# Patient Record
Sex: Female | Born: 1940 | Race: White | Hispanic: No | Marital: Married | Smoking: Former smoker
Health system: Southern US, Community
[De-identification: ages and names within clinical notes are randomized; demographics above are authoritative.]

## PROBLEM LIST (undated history)

## (undated) DIAGNOSIS — F0390 Unspecified dementia without behavioral disturbance: Secondary | ICD-10-CM

## (undated) DIAGNOSIS — Z5189 Encounter for other specified aftercare: Secondary | ICD-10-CM

## (undated) DIAGNOSIS — E039 Hypothyroidism, unspecified: Secondary | ICD-10-CM

## (undated) DIAGNOSIS — M797 Fibromyalgia: Secondary | ICD-10-CM

## (undated) DIAGNOSIS — F419 Anxiety disorder, unspecified: Secondary | ICD-10-CM

## (undated) DIAGNOSIS — N393 Stress incontinence (female) (male): Secondary | ICD-10-CM

## (undated) DIAGNOSIS — M1990 Unspecified osteoarthritis, unspecified site: Secondary | ICD-10-CM

## (undated) DIAGNOSIS — I10 Essential (primary) hypertension: Secondary | ICD-10-CM

## (undated) DIAGNOSIS — K219 Gastro-esophageal reflux disease without esophagitis: Secondary | ICD-10-CM

## (undated) DIAGNOSIS — IMO0001 Reserved for inherently not codable concepts without codable children: Secondary | ICD-10-CM

## (undated) DIAGNOSIS — I639 Cerebral infarction, unspecified: Secondary | ICD-10-CM

## (undated) DIAGNOSIS — I209 Angina pectoris, unspecified: Secondary | ICD-10-CM

## (undated) DIAGNOSIS — R51 Headache: Secondary | ICD-10-CM

## (undated) HISTORY — PX: CERVICAL FUSION: SHX112

## (undated) HISTORY — PX: CHOLECYSTECTOMY: SHX55

## (undated) HISTORY — PX: WISDOM TOOTH EXTRACTION: SHX21

## (undated) HISTORY — PX: RECTOCELE REPAIR: SHX761

## (undated) HISTORY — PX: ABDOMINAL HYSTERECTOMY: SHX81

## (undated) HISTORY — PX: TUBAL LIGATION: SHX77

---

## 1997-07-26 ENCOUNTER — Other Ambulatory Visit: Admission: RE | Admit: 1997-07-26 | Discharge: 1997-07-26 | Payer: Self-pay | Admitting: Obstetrics and Gynecology

## 1997-09-19 ENCOUNTER — Inpatient Hospital Stay (HOSPITAL_COMMUNITY): Admission: RE | Admit: 1997-09-19 | Discharge: 1997-09-21 | Payer: Self-pay | Admitting: Gastroenterology

## 1997-12-18 ENCOUNTER — Ambulatory Visit (HOSPITAL_COMMUNITY): Admission: RE | Admit: 1997-12-18 | Discharge: 1997-12-18 | Payer: Self-pay | Admitting: Obstetrics and Gynecology

## 1997-12-31 ENCOUNTER — Ambulatory Visit (HOSPITAL_COMMUNITY): Admission: RE | Admit: 1997-12-31 | Discharge: 1997-12-31 | Payer: Self-pay | Admitting: Gastroenterology

## 1998-01-13 ENCOUNTER — Inpatient Hospital Stay (HOSPITAL_COMMUNITY): Admission: RE | Admit: 1998-01-13 | Discharge: 1998-01-15 | Payer: Self-pay | Admitting: Obstetrics and Gynecology

## 1998-02-27 ENCOUNTER — Ambulatory Visit (HOSPITAL_COMMUNITY): Admission: RE | Admit: 1998-02-27 | Discharge: 1998-02-27 | Payer: Self-pay | Admitting: Obstetrics and Gynecology

## 1998-11-11 ENCOUNTER — Other Ambulatory Visit: Admission: RE | Admit: 1998-11-11 | Discharge: 1998-11-11 | Payer: Self-pay | Admitting: Obstetrics and Gynecology

## 1999-11-12 ENCOUNTER — Encounter: Payer: Self-pay | Admitting: Obstetrics and Gynecology

## 1999-11-12 ENCOUNTER — Ambulatory Visit (HOSPITAL_COMMUNITY): Admission: RE | Admit: 1999-11-12 | Discharge: 1999-11-12 | Payer: Self-pay | Admitting: Obstetrics and Gynecology

## 1999-12-07 ENCOUNTER — Other Ambulatory Visit: Admission: RE | Admit: 1999-12-07 | Discharge: 1999-12-07 | Payer: Self-pay | Admitting: Obstetrics and Gynecology

## 2000-09-01 ENCOUNTER — Encounter: Payer: Self-pay | Admitting: Internal Medicine

## 2000-09-01 ENCOUNTER — Encounter: Admission: RE | Admit: 2000-09-01 | Discharge: 2000-09-01 | Payer: Self-pay | Admitting: Internal Medicine

## 2000-11-14 ENCOUNTER — Encounter: Payer: Self-pay | Admitting: Obstetrics and Gynecology

## 2000-11-14 ENCOUNTER — Ambulatory Visit (HOSPITAL_COMMUNITY): Admission: RE | Admit: 2000-11-14 | Discharge: 2000-11-14 | Payer: Self-pay | Admitting: Obstetrics and Gynecology

## 2000-12-15 ENCOUNTER — Other Ambulatory Visit: Admission: RE | Admit: 2000-12-15 | Discharge: 2000-12-15 | Payer: Self-pay | Admitting: Obstetrics and Gynecology

## 2001-01-25 ENCOUNTER — Ambulatory Visit (HOSPITAL_COMMUNITY): Admission: RE | Admit: 2001-01-25 | Discharge: 2001-01-25 | Payer: Self-pay | Admitting: Gastroenterology

## 2001-04-26 ENCOUNTER — Encounter: Payer: Self-pay | Admitting: *Deleted

## 2001-04-26 ENCOUNTER — Encounter: Admission: RE | Admit: 2001-04-26 | Discharge: 2001-04-26 | Payer: Self-pay | Admitting: *Deleted

## 2002-03-08 ENCOUNTER — Other Ambulatory Visit: Admission: RE | Admit: 2002-03-08 | Discharge: 2002-03-08 | Payer: Self-pay | Admitting: Obstetrics and Gynecology

## 2002-08-29 ENCOUNTER — Emergency Department (HOSPITAL_COMMUNITY): Admission: EM | Admit: 2002-08-29 | Discharge: 2002-08-29 | Payer: Self-pay | Admitting: Emergency Medicine

## 2002-09-10 ENCOUNTER — Encounter: Payer: Self-pay | Admitting: Internal Medicine

## 2002-09-10 ENCOUNTER — Encounter: Admission: RE | Admit: 2002-09-10 | Discharge: 2002-09-10 | Payer: Self-pay | Admitting: Internal Medicine

## 2002-09-11 ENCOUNTER — Encounter: Payer: Self-pay | Admitting: Internal Medicine

## 2002-09-11 ENCOUNTER — Encounter: Admission: RE | Admit: 2002-09-11 | Discharge: 2002-09-11 | Payer: Self-pay | Admitting: Internal Medicine

## 2002-10-04 ENCOUNTER — Encounter: Admission: RE | Admit: 2002-10-04 | Discharge: 2003-01-02 | Payer: Self-pay | Admitting: Internal Medicine

## 2003-06-18 ENCOUNTER — Ambulatory Visit (HOSPITAL_COMMUNITY): Admission: RE | Admit: 2003-06-18 | Discharge: 2003-06-18 | Payer: Self-pay | Admitting: Internal Medicine

## 2004-02-23 LAB — HM COLONOSCOPY

## 2004-03-10 ENCOUNTER — Ambulatory Visit (HOSPITAL_COMMUNITY): Admission: RE | Admit: 2004-03-10 | Discharge: 2004-03-10 | Payer: Self-pay | Admitting: Gastroenterology

## 2004-04-29 ENCOUNTER — Encounter: Admission: RE | Admit: 2004-04-29 | Discharge: 2004-04-29 | Payer: Self-pay | Admitting: Internal Medicine

## 2004-05-06 ENCOUNTER — Encounter: Admission: RE | Admit: 2004-05-06 | Discharge: 2004-05-06 | Payer: Self-pay

## 2004-05-21 ENCOUNTER — Encounter: Admission: RE | Admit: 2004-05-21 | Discharge: 2004-05-21 | Payer: Self-pay

## 2004-06-04 ENCOUNTER — Encounter: Admission: RE | Admit: 2004-06-04 | Discharge: 2004-06-04 | Payer: Self-pay | Admitting: Neurology

## 2005-01-08 ENCOUNTER — Encounter: Admission: RE | Admit: 2005-01-08 | Discharge: 2005-01-08 | Payer: Self-pay | Admitting: Internal Medicine

## 2005-02-01 ENCOUNTER — Ambulatory Visit: Payer: Self-pay | Admitting: Family Medicine

## 2005-02-03 ENCOUNTER — Ambulatory Visit: Payer: Self-pay | Admitting: Family Medicine

## 2006-06-05 ENCOUNTER — Emergency Department (HOSPITAL_COMMUNITY): Admission: EM | Admit: 2006-06-05 | Discharge: 2006-06-05 | Payer: Self-pay | Admitting: Emergency Medicine

## 2006-06-12 ENCOUNTER — Inpatient Hospital Stay (HOSPITAL_COMMUNITY): Admission: EM | Admit: 2006-06-12 | Discharge: 2006-06-15 | Payer: Self-pay | Admitting: Emergency Medicine

## 2007-11-09 ENCOUNTER — Ambulatory Visit (HOSPITAL_COMMUNITY): Admission: RE | Admit: 2007-11-09 | Discharge: 2007-11-09 | Payer: Self-pay | Admitting: Family Medicine

## 2007-12-19 ENCOUNTER — Encounter: Admission: RE | Admit: 2007-12-19 | Discharge: 2007-12-19 | Payer: Self-pay | Admitting: Surgery

## 2008-02-12 ENCOUNTER — Encounter (INDEPENDENT_AMBULATORY_CARE_PROVIDER_SITE_OTHER): Payer: Self-pay | Admitting: Surgery

## 2008-02-12 ENCOUNTER — Ambulatory Visit (HOSPITAL_COMMUNITY): Admission: RE | Admit: 2008-02-12 | Discharge: 2008-02-12 | Payer: Self-pay | Admitting: Surgery

## 2008-10-21 ENCOUNTER — Encounter (INDEPENDENT_AMBULATORY_CARE_PROVIDER_SITE_OTHER): Payer: Self-pay | Admitting: Internal Medicine

## 2008-10-21 ENCOUNTER — Ambulatory Visit: Payer: Self-pay | Admitting: Cardiology

## 2008-10-21 ENCOUNTER — Inpatient Hospital Stay (HOSPITAL_COMMUNITY): Admission: EM | Admit: 2008-10-21 | Discharge: 2008-10-28 | Payer: Self-pay | Admitting: Emergency Medicine

## 2008-10-22 ENCOUNTER — Ambulatory Visit: Payer: Self-pay | Admitting: Physical Medicine & Rehabilitation

## 2008-10-22 ENCOUNTER — Encounter (INDEPENDENT_AMBULATORY_CARE_PROVIDER_SITE_OTHER): Payer: Self-pay | Admitting: Internal Medicine

## 2008-10-23 ENCOUNTER — Encounter: Payer: Self-pay | Admitting: Cardiology

## 2008-10-23 ENCOUNTER — Encounter (INDEPENDENT_AMBULATORY_CARE_PROVIDER_SITE_OTHER): Payer: Self-pay | Admitting: Internal Medicine

## 2008-10-28 ENCOUNTER — Ambulatory Visit: Payer: Self-pay | Admitting: Physical Medicine & Rehabilitation

## 2008-10-28 ENCOUNTER — Inpatient Hospital Stay (HOSPITAL_COMMUNITY)
Admission: RE | Admit: 2008-10-28 | Discharge: 2008-11-13 | Payer: Self-pay | Admitting: Physical Medicine & Rehabilitation

## 2008-11-01 ENCOUNTER — Ambulatory Visit: Payer: Self-pay | Admitting: Psychology

## 2008-11-02 ENCOUNTER — Ambulatory Visit: Payer: Self-pay | Admitting: Physical Medicine & Rehabilitation

## 2008-11-18 ENCOUNTER — Inpatient Hospital Stay (HOSPITAL_COMMUNITY): Admission: EM | Admit: 2008-11-18 | Discharge: 2008-12-03 | Payer: Self-pay | Admitting: Emergency Medicine

## 2008-11-18 ENCOUNTER — Ambulatory Visit: Payer: Self-pay | Admitting: Gastroenterology

## 2008-11-22 ENCOUNTER — Ambulatory Visit: Payer: Self-pay | Admitting: Physical Medicine & Rehabilitation

## 2008-12-04 ENCOUNTER — Encounter
Admission: RE | Admit: 2008-12-04 | Discharge: 2008-12-04 | Payer: Self-pay | Admitting: Physical Medicine & Rehabilitation

## 2009-12-04 ENCOUNTER — Encounter
Admission: RE | Admit: 2009-12-04 | Discharge: 2010-02-19 | Payer: Self-pay | Source: Home / Self Care | Attending: Obstetrics and Gynecology | Admitting: Obstetrics and Gynecology

## 2010-02-04 ENCOUNTER — Emergency Department (HOSPITAL_COMMUNITY)
Admission: EM | Admit: 2010-02-04 | Discharge: 2010-02-04 | Payer: Self-pay | Source: Home / Self Care | Admitting: Emergency Medicine

## 2010-02-11 ENCOUNTER — Encounter: Admission: RE | Admit: 2010-02-11 | Payer: Self-pay | Source: Home / Self Care | Admitting: Obstetrics and Gynecology

## 2010-02-23 ENCOUNTER — Encounter
Admission: RE | Admit: 2010-02-23 | Discharge: 2010-03-16 | Payer: Self-pay | Source: Home / Self Care | Attending: Obstetrics and Gynecology | Admitting: Obstetrics and Gynecology

## 2010-03-04 ENCOUNTER — Encounter: Admit: 2010-03-04 | Payer: Self-pay | Admitting: Obstetrics and Gynecology

## 2010-03-15 ENCOUNTER — Encounter: Payer: Self-pay | Admitting: Family Medicine

## 2010-05-05 LAB — CK TOTAL AND CKMB (NOT AT ARMC)
CK, MB: 1 ng/mL (ref 0.3–4.0)
Total CK: 42 U/L (ref 7–177)

## 2010-05-05 LAB — CBC
HCT: 43.3 % (ref 36.0–46.0)
MCV: 91.4 fL (ref 78.0–100.0)
RBC: 4.74 MIL/uL (ref 3.87–5.11)
RDW: 12.8 % (ref 11.5–15.5)
WBC: 11.4 10*3/uL — ABNORMAL HIGH (ref 4.0–10.5)

## 2010-05-05 LAB — COMPREHENSIVE METABOLIC PANEL
ALT: 26 U/L (ref 0–35)
Alkaline Phosphatase: 78 U/L (ref 39–117)
CO2: 31 mEq/L (ref 19–32)
Chloride: 99 mEq/L (ref 96–112)
GFR calc Af Amer: >60 mL/min (ref >60)
GFR calc non Af Amer: >60 mL/min (ref >60)
Potassium: 3.3 mEq/L — ABNORMAL LOW (ref 3.5–5.1)
Sodium: 137 mEq/L (ref 135–145)
Total Bilirubin: 0.6 mg/dL (ref 0.3–1.2)

## 2010-05-05 LAB — PROTIME-INR
INR: 0.93 (ref 0.00–1.49)
Prothrombin Time: 12.7 seconds (ref 11.6–15.2)

## 2010-05-05 LAB — APTT: aPTT: 28 seconds (ref 24–37)

## 2010-05-05 LAB — DIFFERENTIAL
Eosinophils Absolute: 0.1 10*3/uL (ref 0.0–0.7)
Lymphocytes Relative: 30 % (ref 12–46)
Lymphs Abs: 3.4 10*3/uL (ref 0.7–4.0)
Neutrophils Relative %: 62 % (ref 43–77)

## 2010-05-11 DIAGNOSIS — E119 Type 2 diabetes mellitus without complications: Secondary | ICD-10-CM | POA: Insufficient documentation

## 2010-05-11 DIAGNOSIS — K5792 Diverticulitis of intestine, part unspecified, without perforation or abscess without bleeding: Secondary | ICD-10-CM | POA: Insufficient documentation

## 2010-05-11 DIAGNOSIS — F418 Other specified anxiety disorders: Secondary | ICD-10-CM | POA: Insufficient documentation

## 2010-05-11 DIAGNOSIS — M797 Fibromyalgia: Secondary | ICD-10-CM | POA: Insufficient documentation

## 2010-05-11 DIAGNOSIS — F329 Major depressive disorder, single episode, unspecified: Secondary | ICD-10-CM

## 2010-05-11 DIAGNOSIS — I10 Essential (primary) hypertension: Secondary | ICD-10-CM | POA: Insufficient documentation

## 2010-05-11 DIAGNOSIS — I639 Cerebral infarction, unspecified: Secondary | ICD-10-CM

## 2010-05-28 LAB — PHOSPHORUS: Phosphorus: 3.8 mg/dL (ref 2.3–4.6)

## 2010-05-28 LAB — GLUCOSE, CAPILLARY
Glucose-Capillary: 111 mg/dL — ABNORMAL HIGH (ref 70–99)
Glucose-Capillary: 115 mg/dL — ABNORMAL HIGH (ref 70–99)
Glucose-Capillary: 115 mg/dL — ABNORMAL HIGH (ref 70–99)
Glucose-Capillary: 117 mg/dL — ABNORMAL HIGH (ref 70–99)
Glucose-Capillary: 121 mg/dL — ABNORMAL HIGH (ref 70–99)
Glucose-Capillary: 127 mg/dL — ABNORMAL HIGH (ref 70–99)
Glucose-Capillary: 133 mg/dL — ABNORMAL HIGH (ref 70–99)
Glucose-Capillary: 135 mg/dL — ABNORMAL HIGH (ref 70–99)
Glucose-Capillary: 137 mg/dL — ABNORMAL HIGH (ref 70–99)
Glucose-Capillary: 139 mg/dL — ABNORMAL HIGH (ref 70–99)
Glucose-Capillary: 142 mg/dL — ABNORMAL HIGH (ref 70–99)
Glucose-Capillary: 150 mg/dL — ABNORMAL HIGH (ref 70–99)
Glucose-Capillary: 151 mg/dL — ABNORMAL HIGH (ref 70–99)
Glucose-Capillary: 152 mg/dL — ABNORMAL HIGH (ref 70–99)
Glucose-Capillary: 152 mg/dL — ABNORMAL HIGH (ref 70–99)
Glucose-Capillary: 170 mg/dL — ABNORMAL HIGH (ref 70–99)
Glucose-Capillary: 187 mg/dL — ABNORMAL HIGH (ref 70–99)
Glucose-Capillary: 190 mg/dL — ABNORMAL HIGH (ref 70–99)
Glucose-Capillary: 196 mg/dL — ABNORMAL HIGH (ref 70–99)
Glucose-Capillary: 207 mg/dL — ABNORMAL HIGH (ref 70–99)
Glucose-Capillary: 210 mg/dL — ABNORMAL HIGH (ref 70–99)
Glucose-Capillary: 214 mg/dL — ABNORMAL HIGH (ref 70–99)
Glucose-Capillary: 239 mg/dL — ABNORMAL HIGH (ref 70–99)
Glucose-Capillary: 271 mg/dL — ABNORMAL HIGH (ref 70–99)
Glucose-Capillary: 51 mg/dL — ABNORMAL LOW (ref 70–99)
Glucose-Capillary: 56 mg/dL — ABNORMAL LOW (ref 70–99)
Glucose-Capillary: 58 mg/dL — ABNORMAL LOW (ref 70–99)

## 2010-05-28 LAB — ELECTROLYTE PANEL
CO2: 20 mEq/L (ref 19–32)
CO2: 22 mEq/L (ref 19–32)
CO2: 23 mEq/L (ref 19–32)
CO2: 23 mEq/L (ref 19–32)
CO2: 23 mEq/L (ref 19–32)
CO2: 24 mEq/L (ref 19–32)
CO2: 24 mEq/L (ref 19–32)
CO2: 24 mEq/L (ref 19–32)
CO2: 24 mEq/L (ref 19–32)
CO2: 24 mEq/L (ref 19–32)
CO2: 25 mEq/L (ref 19–32)
CO2: 25 mEq/L (ref 19–32)
CO2: 26 mEq/L (ref 19–32)
Chloride: 101 mEq/L (ref 96–112)
Chloride: 101 mEq/L (ref 96–112)
Chloride: 102 mEq/L (ref 96–112)
Chloride: 102 mEq/L (ref 96–112)
Chloride: 103 mEq/L (ref 96–112)
Chloride: 103 mEq/L (ref 96–112)
Chloride: 104 mEq/L (ref 96–112)
Chloride: 105 mEq/L (ref 96–112)
Chloride: 105 mEq/L (ref 96–112)
Chloride: 105 mEq/L (ref 96–112)
Chloride: 107 mEq/L (ref 96–112)
Chloride: 91 mEq/L — ABNORMAL LOW (ref 96–112)
Chloride: 91 mEq/L — ABNORMAL LOW (ref 96–112)
Chloride: 91 mEq/L — ABNORMAL LOW (ref 96–112)
Chloride: 94 mEq/L — ABNORMAL LOW (ref 96–112)
Chloride: 95 mEq/L — ABNORMAL LOW (ref 96–112)
Chloride: 96 mEq/L (ref 96–112)
Potassium: 3.2 mEq/L — ABNORMAL LOW (ref 3.5–5.1)
Potassium: 3.4 mEq/L — ABNORMAL LOW (ref 3.5–5.1)
Potassium: 3.5 mEq/L (ref 3.5–5.1)
Potassium: 3.7 mEq/L (ref 3.5–5.1)
Potassium: 3.8 mEq/L (ref 3.5–5.1)
Potassium: 4 mEq/L (ref 3.5–5.1)
Potassium: 4.2 mEq/L (ref 3.5–5.1)
Potassium: 4.2 mEq/L (ref 3.5–5.1)
Potassium: 4.3 mEq/L (ref 3.5–5.1)
Potassium: 4.7 mEq/L (ref 3.5–5.1)
Potassium: 5 mEq/L (ref 3.5–5.1)
Sodium: 123 mEq/L — ABNORMAL LOW (ref 135–145)
Sodium: 123 mEq/L — ABNORMAL LOW (ref 135–145)
Sodium: 127 mEq/L — ABNORMAL LOW (ref 135–145)
Sodium: 127 mEq/L — ABNORMAL LOW (ref 135–145)
Sodium: 129 mEq/L — ABNORMAL LOW (ref 135–145)
Sodium: 130 mEq/L — ABNORMAL LOW (ref 135–145)
Sodium: 131 mEq/L — ABNORMAL LOW (ref 135–145)
Sodium: 132 mEq/L — ABNORMAL LOW (ref 135–145)
Sodium: 132 mEq/L — ABNORMAL LOW (ref 135–145)
Sodium: 134 mEq/L — ABNORMAL LOW (ref 135–145)
Sodium: 134 mEq/L — ABNORMAL LOW (ref 135–145)
Sodium: 135 mEq/L (ref 135–145)
Sodium: 135 mEq/L (ref 135–145)
Sodium: 135 mEq/L (ref 135–145)

## 2010-05-28 LAB — BASIC METABOLIC PANEL
BUN: 11 mg/dL (ref 6–23)
BUN: 11 mg/dL (ref 6–23)
BUN: 4 mg/dL — ABNORMAL LOW (ref 6–23)
BUN: 7 mg/dL (ref 6–23)
BUN: 7 mg/dL (ref 6–23)
BUN: 8 mg/dL (ref 6–23)
BUN: 8 mg/dL (ref 6–23)
BUN: 9 mg/dL (ref 6–23)
CO2: 19 mEq/L (ref 19–32)
CO2: 22 mEq/L (ref 19–32)
CO2: 23 mEq/L (ref 19–32)
CO2: 23 mEq/L (ref 19–32)
CO2: 25 mEq/L (ref 19–32)
CO2: 25 mEq/L (ref 19–32)
CO2: 27 mEq/L (ref 19–32)
Calcium: 10.1 mg/dL (ref 8.4–10.5)
Calcium: 9 mg/dL (ref 8.4–10.5)
Calcium: 9.2 mg/dL (ref 8.4–10.5)
Calcium: 9.2 mg/dL (ref 8.4–10.5)
Calcium: 9.3 mg/dL (ref 8.4–10.5)
Calcium: 9.4 mg/dL (ref 8.4–10.5)
Chloride: 102 mEq/L (ref 96–112)
Chloride: 104 mEq/L (ref 96–112)
Chloride: 105 mEq/L (ref 96–112)
Chloride: 106 mEq/L (ref 96–112)
Chloride: 108 mEq/L (ref 96–112)
Chloride: 93 mEq/L — ABNORMAL LOW (ref 96–112)
Chloride: 93 mEq/L — ABNORMAL LOW (ref 96–112)
Creatinine, Ser: 0.73 mg/dL (ref 0.4–1.2)
Creatinine, Ser: 0.77 mg/dL (ref 0.4–1.2)
Creatinine, Ser: 0.79 mg/dL (ref 0.4–1.2)
Creatinine, Ser: 0.79 mg/dL (ref 0.4–1.2)
Creatinine, Ser: 0.83 mg/dL (ref 0.4–1.2)
Creatinine, Ser: 0.87 mg/dL (ref 0.4–1.2)
Creatinine, Ser: 1.01 mg/dL (ref 0.4–1.2)
GFR calc Af Amer: >60 mL/min (ref >60)
GFR calc Af Amer: >60 mL/min (ref >60)
GFR calc Af Amer: >60 mL/min (ref >60)
GFR calc Af Amer: >60 mL/min (ref >60)
GFR calc Af Amer: >60 mL/min (ref >60)
GFR calc non Af Amer: 57 mL/min — ABNORMAL LOW (ref >60)
GFR calc non Af Amer: >60 mL/min (ref >60)
GFR calc non Af Amer: >60 mL/min (ref >60)
GFR calc non Af Amer: >60 mL/min (ref >60)
GFR calc non Af Amer: >60 mL/min (ref >60)
Glucose, Bld: 102 mg/dL — ABNORMAL HIGH (ref 70–99)
Glucose, Bld: 124 mg/dL — ABNORMAL HIGH (ref 70–99)
Glucose, Bld: 142 mg/dL — ABNORMAL HIGH (ref 70–99)
Glucose, Bld: 144 mg/dL — ABNORMAL HIGH (ref 70–99)
Potassium: 3.3 mEq/L — ABNORMAL LOW (ref 3.5–5.1)
Potassium: 3.5 mEq/L (ref 3.5–5.1)
Potassium: 3.6 mEq/L (ref 3.5–5.1)
Potassium: 3.7 mEq/L (ref 3.5–5.1)
Potassium: 3.8 mEq/L (ref 3.5–5.1)
Potassium: 3.9 mEq/L (ref 3.5–5.1)
Potassium: 4.4 mEq/L (ref 3.5–5.1)
Sodium: 125 mEq/L — ABNORMAL LOW (ref 135–145)
Sodium: 127 mEq/L — ABNORMAL LOW (ref 135–145)
Sodium: 129 mEq/L — ABNORMAL LOW (ref 135–145)
Sodium: 137 mEq/L (ref 135–145)
Sodium: 138 mEq/L (ref 135–145)

## 2010-05-28 LAB — CBC
HCT: 39.3 % (ref 36.0–46.0)
HCT: 41.8 % (ref 36.0–46.0)
HCT: 44.3 % (ref 36.0–46.0)
Hemoglobin: 13.5 g/dL (ref 12.0–15.0)
Hemoglobin: 14.1 g/dL (ref 12.0–15.0)
Hemoglobin: 15.1 g/dL — ABNORMAL HIGH (ref 12.0–15.0)
MCHC: 33.7 g/dL (ref 30.0–36.0)
MCHC: 34.1 g/dL (ref 30.0–36.0)
MCHC: 34.3 g/dL (ref 30.0–36.0)
MCV: 91.8 fL (ref 78.0–100.0)
MCV: 92.3 fL (ref 78.0–100.0)
MCV: 93 fL (ref 78.0–100.0)
Platelets: 251 10*3/uL (ref 150–400)
Platelets: 276 10*3/uL (ref 150–400)
RBC: 4.26 MIL/uL (ref 3.87–5.11)
RBC: 4.54 MIL/uL (ref 3.87–5.11)
RBC: 4.83 MIL/uL (ref 3.87–5.11)
RDW: 13.2 % (ref 11.5–15.5)
RDW: 13.3 % (ref 11.5–15.5)
WBC: 11.4 10*3/uL — ABNORMAL HIGH (ref 4.0–10.5)
WBC: 12.7 10*3/uL — ABNORMAL HIGH (ref 4.0–10.5)
WBC: 12.8 10*3/uL — ABNORMAL HIGH (ref 4.0–10.5)

## 2010-05-28 LAB — URINALYSIS, ROUTINE W REFLEX MICROSCOPIC
Bilirubin Urine: NEGATIVE
Ketones, ur: 15 mg/dL — AB
Nitrite: NEGATIVE
Protein, ur: 30 mg/dL — AB
Urobilinogen, UA: 0.2 mg/dL (ref 0.0–1.0)

## 2010-05-28 LAB — MAGNESIUM
Magnesium: 1.8 mg/dL (ref 1.5–2.5)
Magnesium: 1.8 mg/dL (ref 1.5–2.5)

## 2010-05-28 LAB — CARDIAC PANEL(CRET KIN+CKTOT+MB+TROPI)
CK, MB: 2.1 ng/mL (ref 0.3–4.0)
Total CK: 120 U/L (ref 7–177)
Troponin I: 0.02 ng/mL (ref 0.00–0.06)

## 2010-05-29 LAB — URINE CULTURE
Colony Count: 65000
Colony Count: >=100000
Colony Count: NO GROWTH
Colony Count: 45000

## 2010-05-29 LAB — URINALYSIS, ROUTINE W REFLEX MICROSCOPIC
Bilirubin Urine: NEGATIVE
Bilirubin Urine: NEGATIVE
Glucose, UA: NEGATIVE mg/dL
Glucose, UA: NEGATIVE mg/dL
Glucose, UA: NEGATIVE mg/dL
Hgb urine dipstick: NEGATIVE
Hgb urine dipstick: NEGATIVE
Hgb urine dipstick: NEGATIVE
Hgb urine dipstick: NEGATIVE
Ketones, ur: 15 mg/dL — AB
Ketones, ur: NEGATIVE mg/dL
Ketones, ur: NEGATIVE mg/dL
Nitrite: NEGATIVE
Nitrite: NEGATIVE
Protein, ur: NEGATIVE mg/dL
Specific Gravity, Urine: 1.012 (ref 1.005–1.030)
Specific Gravity, Urine: 1.013 (ref 1.005–1.030)
Specific Gravity, Urine: 1.009 (ref 1.005–1.030)
Urobilinogen, UA: 0.2 mg/dL (ref 0.0–1.0)
pH: 7 (ref 5.0–8.0)
pH: 7.5 (ref 5.0–8.0)
pH: 7 (ref 5.0–8.0)

## 2010-05-29 LAB — GLUCOSE, CAPILLARY
Glucose-Capillary: 101 mg/dL — ABNORMAL HIGH (ref 70–99)
Glucose-Capillary: 104 mg/dL — ABNORMAL HIGH (ref 70–99)
Glucose-Capillary: 109 mg/dL — ABNORMAL HIGH (ref 70–99)
Glucose-Capillary: 110 mg/dL — ABNORMAL HIGH (ref 70–99)
Glucose-Capillary: 121 mg/dL — ABNORMAL HIGH (ref 70–99)
Glucose-Capillary: 122 mg/dL — ABNORMAL HIGH (ref 70–99)
Glucose-Capillary: 135 mg/dL — ABNORMAL HIGH (ref 70–99)
Glucose-Capillary: 136 mg/dL — ABNORMAL HIGH (ref 70–99)
Glucose-Capillary: 139 mg/dL — ABNORMAL HIGH (ref 70–99)
Glucose-Capillary: 141 mg/dL — ABNORMAL HIGH (ref 70–99)
Glucose-Capillary: 142 mg/dL — ABNORMAL HIGH (ref 70–99)
Glucose-Capillary: 154 mg/dL — ABNORMAL HIGH (ref 70–99)
Glucose-Capillary: 158 mg/dL — ABNORMAL HIGH (ref 70–99)
Glucose-Capillary: 164 mg/dL — ABNORMAL HIGH (ref 70–99)
Glucose-Capillary: 180 mg/dL — ABNORMAL HIGH (ref 70–99)
Glucose-Capillary: 197 mg/dL — ABNORMAL HIGH (ref 70–99)
Glucose-Capillary: 200 mg/dL — ABNORMAL HIGH (ref 70–99)
Glucose-Capillary: 71 mg/dL (ref 70–99)
Glucose-Capillary: 77 mg/dL (ref 70–99)
Glucose-Capillary: 83 mg/dL (ref 70–99)
Glucose-Capillary: 87 mg/dL (ref 70–99)
Glucose-Capillary: 98 mg/dL (ref 70–99)
Glucose-Capillary: 102 mg/dL — ABNORMAL HIGH (ref 70–99)
Glucose-Capillary: 102 mg/dL — ABNORMAL HIGH (ref 70–99)
Glucose-Capillary: 105 mg/dL — ABNORMAL HIGH (ref 70–99)
Glucose-Capillary: 110 mg/dL — ABNORMAL HIGH (ref 70–99)
Glucose-Capillary: 111 mg/dL — ABNORMAL HIGH (ref 70–99)
Glucose-Capillary: 112 mg/dL — ABNORMAL HIGH (ref 70–99)
Glucose-Capillary: 125 mg/dL — ABNORMAL HIGH (ref 70–99)
Glucose-Capillary: 126 mg/dL — ABNORMAL HIGH (ref 70–99)
Glucose-Capillary: 127 mg/dL — ABNORMAL HIGH (ref 70–99)
Glucose-Capillary: 133 mg/dL — ABNORMAL HIGH (ref 70–99)
Glucose-Capillary: 146 mg/dL — ABNORMAL HIGH (ref 70–99)
Glucose-Capillary: 148 mg/dL — ABNORMAL HIGH (ref 70–99)
Glucose-Capillary: 153 mg/dL — ABNORMAL HIGH (ref 70–99)
Glucose-Capillary: 153 mg/dL — ABNORMAL HIGH (ref 70–99)
Glucose-Capillary: 154 mg/dL — ABNORMAL HIGH (ref 70–99)
Glucose-Capillary: 159 mg/dL — ABNORMAL HIGH (ref 70–99)
Glucose-Capillary: 164 mg/dL — ABNORMAL HIGH (ref 70–99)
Glucose-Capillary: 167 mg/dL — ABNORMAL HIGH (ref 70–99)
Glucose-Capillary: 174 mg/dL — ABNORMAL HIGH (ref 70–99)
Glucose-Capillary: 182 mg/dL — ABNORMAL HIGH (ref 70–99)
Glucose-Capillary: 189 mg/dL — ABNORMAL HIGH (ref 70–99)
Glucose-Capillary: 189 mg/dL — ABNORMAL HIGH (ref 70–99)
Glucose-Capillary: 189 mg/dL — ABNORMAL HIGH (ref 70–99)
Glucose-Capillary: 244 mg/dL — ABNORMAL HIGH (ref 70–99)
Glucose-Capillary: 257 mg/dL — ABNORMAL HIGH (ref 70–99)
Glucose-Capillary: 258 mg/dL — ABNORMAL HIGH (ref 70–99)
Glucose-Capillary: 267 mg/dL — ABNORMAL HIGH (ref 70–99)
Glucose-Capillary: 71 mg/dL (ref 70–99)
Glucose-Capillary: 97 mg/dL (ref 70–99)

## 2010-05-29 LAB — CBC
Hemoglobin: 13.1 g/dL (ref 12.0–15.0)
Hemoglobin: 14.2 g/dL (ref 12.0–15.0)
MCHC: 33.9 g/dL (ref 30.0–36.0)
MCHC: 34.3 g/dL (ref 30.0–36.0)
MCHC: 33.5 g/dL (ref 30.0–36.0)
MCHC: 34.2 g/dL (ref 30.0–36.0)
MCV: 90.9 fL (ref 78.0–100.0)
MCV: 91.1 fL (ref 78.0–100.0)
MCV: 91.8 fL (ref 78.0–100.0)
MCV: 92 fL (ref 78.0–100.0)
MCV: 93 fL (ref 78.0–100.0)
Platelets: 265 10*3/uL (ref 150–400)
Platelets: 220 10*3/uL (ref 150–400)
RBC: 4.2 MIL/uL (ref 3.87–5.11)
RBC: 4.56 MIL/uL (ref 3.87–5.11)
RBC: 4.71 MIL/uL (ref 3.87–5.11)
RBC: 4.87 MIL/uL (ref 3.87–5.11)
RDW: 12.6 % (ref 11.5–15.5)
RDW: 12.8 % (ref 11.5–15.5)
RDW: 12.9 % (ref 11.5–15.5)
WBC: 12.3 10*3/uL — ABNORMAL HIGH (ref 4.0–10.5)
WBC: 18.4 10*3/uL — ABNORMAL HIGH (ref 4.0–10.5)

## 2010-05-29 LAB — BASIC METABOLIC PANEL
BUN: 5 mg/dL — ABNORMAL LOW (ref 6–23)
BUN: 7 mg/dL (ref 6–23)
BUN: 7 mg/dL (ref 6–23)
BUN: 9 mg/dL (ref 6–23)
CO2: 25 mEq/L (ref 19–32)
CO2: 25 mEq/L (ref 19–32)
CO2: 26 mEq/L (ref 19–32)
Calcium: 8.5 mg/dL (ref 8.4–10.5)
Calcium: 9.1 mg/dL (ref 8.4–10.5)
Calcium: 9.2 mg/dL (ref 8.4–10.5)
Chloride: 91 mEq/L — ABNORMAL LOW (ref 96–112)
Chloride: 95 mEq/L — ABNORMAL LOW (ref 96–112)
Chloride: 99 mEq/L (ref 96–112)
Creatinine, Ser: 0.76 mg/dL (ref 0.4–1.2)
Creatinine, Ser: 0.86 mg/dL (ref 0.4–1.2)
Creatinine, Ser: 0.9 mg/dL (ref 0.4–1.2)
Creatinine, Ser: 0.94 mg/dL (ref 0.4–1.2)
GFR calc Af Amer: >60 mL/min (ref >60)
GFR calc Af Amer: >60 mL/min (ref >60)
GFR calc non Af Amer: >60 mL/min (ref >60)
GFR calc non Af Amer: 59 mL/min — ABNORMAL LOW (ref >60)
GFR calc non Af Amer: >60 mL/min (ref >60)
Glucose, Bld: 104 mg/dL — ABNORMAL HIGH (ref 70–99)
Glucose, Bld: 68 mg/dL — ABNORMAL LOW (ref 70–99)
Glucose, Bld: 76 mg/dL (ref 70–99)
Potassium: 3.2 mEq/L — ABNORMAL LOW (ref 3.5–5.1)

## 2010-05-29 LAB — COMPREHENSIVE METABOLIC PANEL
ALT: 40 U/L — ABNORMAL HIGH (ref 0–35)
ALT: 48 U/L — ABNORMAL HIGH (ref 0–35)
AST: 44 U/L — ABNORMAL HIGH (ref 0–37)
AST: 29 U/L (ref 0–37)
AST: 32 U/L (ref 0–37)
Albumin: 4.1 g/dL (ref 3.5–5.2)
Albumin: 3.6 g/dL (ref 3.5–5.2)
CO2: 25 mEq/L (ref 19–32)
CO2: 30 mEq/L (ref 19–32)
CO2: 32 mEq/L (ref 19–32)
Calcium: 9.6 mg/dL (ref 8.4–10.5)
Calcium: 9.2 mg/dL (ref 8.4–10.5)
Calcium: 9.6 mg/dL (ref 8.4–10.5)
Chloride: 76 mEq/L — CL (ref 96–112)
Chloride: 94 mEq/L — ABNORMAL LOW (ref 96–112)
Chloride: 95 mEq/L — ABNORMAL LOW (ref 96–112)
Creatinine, Ser: 0.71 mg/dL (ref 0.4–1.2)
Creatinine, Ser: 0.91 mg/dL (ref 0.4–1.2)
Creatinine, Ser: 0.89 mg/dL (ref 0.4–1.2)
Creatinine, Ser: 0.96 mg/dL (ref 0.4–1.2)
GFR calc Af Amer: >60 mL/min (ref >60)
GFR calc Af Amer: >60 mL/min (ref >60)
GFR calc Af Amer: >60 mL/min (ref >60)
GFR calc non Af Amer: >60 mL/min (ref >60)
GFR calc non Af Amer: 58 mL/min — ABNORMAL LOW (ref >60)
GFR calc non Af Amer: >60 mL/min (ref >60)
Glucose, Bld: 191 mg/dL — ABNORMAL HIGH (ref 70–99)
Glucose, Bld: 114 mg/dL — ABNORMAL HIGH (ref 70–99)
Sodium: 131 mEq/L — ABNORMAL LOW (ref 135–145)
Sodium: 136 mEq/L (ref 135–145)
Sodium: 136 mEq/L (ref 135–145)
Total Bilirubin: 1.1 mg/dL (ref 0.3–1.2)
Total Bilirubin: 1.9 mg/dL — ABNORMAL HIGH (ref 0.3–1.2)
Total Protein: 7 g/dL (ref 6.0–8.3)
Total Protein: 6.5 g/dL (ref 6.0–8.3)

## 2010-05-29 LAB — CARDIAC PANEL(CRET KIN+CKTOT+MB+TROPI)
Relative Index: 1.7 (ref 0.0–2.5)
Total CK: 525 U/L — ABNORMAL HIGH (ref 7–177)
Troponin I: 0.02 ng/mL (ref 0.00–0.06)

## 2010-05-29 LAB — DIFFERENTIAL
Basophils Absolute: 0 10*3/uL (ref 0.0–0.1)
Basophils Absolute: 0 10*3/uL (ref 0.0–0.1)
Basophils Relative: 0 % (ref 0–1)
Eosinophils Absolute: 0 10*3/uL (ref 0.0–0.7)
Eosinophils Absolute: 0 10*3/uL (ref 0.0–0.7)
Eosinophils Relative: 0 % (ref 0–5)
Eosinophils Relative: 0 % (ref 0–5)
Eosinophils Relative: 0 % (ref 0–5)
Lymphocytes Relative: 9 % — ABNORMAL LOW (ref 12–46)
Lymphocytes Relative: 22 % (ref 12–46)
Lymphs Abs: 1.4 10*3/uL (ref 0.7–4.0)
Lymphs Abs: 1.7 10*3/uL (ref 0.7–4.0)
Lymphs Abs: 2.6 10*3/uL (ref 0.7–4.0)
Monocytes Absolute: 1.2 10*3/uL — ABNORMAL HIGH (ref 0.1–1.0)
Monocytes Absolute: 1.4 10*3/uL — ABNORMAL HIGH (ref 0.1–1.0)
Monocytes Relative: 10 % (ref 3–12)
Monocytes Relative: 8 % (ref 3–12)
Monocytes Relative: 9 % (ref 3–12)
Neutro Abs: 8.6 10*3/uL — ABNORMAL HIGH (ref 1.7–7.7)
Neutrophils Relative %: 80 % — ABNORMAL HIGH (ref 43–77)
Neutrophils Relative %: 68 % (ref 43–77)

## 2010-05-29 LAB — ELECTROLYTE PANEL
CO2: 21 mEq/L (ref 19–32)
CO2: 22 mEq/L (ref 19–32)
CO2: 24 mEq/L (ref 19–32)
CO2: 25 mEq/L (ref 19–32)
CO2: 25 mEq/L (ref 19–32)
CO2: 26 mEq/L (ref 19–32)
CO2: 28 mEq/L (ref 19–32)
Chloride: 88 mEq/L — ABNORMAL LOW (ref 96–112)
Chloride: 90 mEq/L — ABNORMAL LOW (ref 96–112)
Chloride: 91 mEq/L — ABNORMAL LOW (ref 96–112)
Chloride: 92 mEq/L — ABNORMAL LOW (ref 96–112)
Chloride: 93 mEq/L — ABNORMAL LOW (ref 96–112)
Potassium: 2.9 mEq/L — ABNORMAL LOW (ref 3.5–5.1)
Potassium: 3.1 mEq/L — ABNORMAL LOW (ref 3.5–5.1)
Potassium: 3.2 mEq/L — ABNORMAL LOW (ref 3.5–5.1)
Potassium: 3.2 mEq/L — ABNORMAL LOW (ref 3.5–5.1)
Potassium: 3.4 mEq/L — ABNORMAL LOW (ref 3.5–5.1)
Potassium: 4 mEq/L (ref 3.5–5.1)
Sodium: 119 mEq/L — CL (ref 135–145)
Sodium: 124 mEq/L — ABNORMAL LOW (ref 135–145)
Sodium: 125 mEq/L — ABNORMAL LOW (ref 135–145)
Sodium: 126 mEq/L — ABNORMAL LOW (ref 135–145)
Sodium: 128 mEq/L — ABNORMAL LOW (ref 135–145)
Sodium: 129 mEq/L — ABNORMAL LOW (ref 135–145)

## 2010-05-29 LAB — OSMOLALITY, URINE: Osmolality, Ur: 87 mOsm/kg — ABNORMAL LOW (ref 390–1090)

## 2010-05-29 LAB — RENAL FUNCTION PANEL
Albumin: 3.2 g/dL — ABNORMAL LOW (ref 3.5–5.2)
BUN: 7 mg/dL (ref 6–23)
Calcium: 8.7 mg/dL (ref 8.4–10.5)
Creatinine, Ser: 0.8 mg/dL (ref 0.4–1.2)
Glucose, Bld: 84 mg/dL (ref 70–99)
Phosphorus: 3.3 mg/dL (ref 2.3–4.6)
Potassium: 3.3 mEq/L — ABNORMAL LOW (ref 3.5–5.1)

## 2010-05-29 LAB — HEMOGLOBIN A1C: Hgb A1c MFr Bld: 7.9 % — ABNORMAL HIGH (ref 4.6–6.1)

## 2010-05-29 LAB — POCT CARDIAC MARKERS
CKMB, poc: 6.2 ng/mL (ref 1.0–8.0)
CKMB, poc: 6.7 ng/mL (ref 1.0–8.0)
Myoglobin, poc: 351 ng/mL (ref 12–200)
Troponin i, poc: <0.05 ng/mL (ref 0.00–0.09)
Troponin i, poc: <0.05 ng/mL (ref 0.00–0.09)

## 2010-05-29 LAB — CK TOTAL AND CKMB (NOT AT ARMC)
CK, MB: 8.2 ng/mL — ABNORMAL HIGH (ref 0.3–4.0)
Total CK: 755 U/L — ABNORMAL HIGH (ref 7–177)

## 2010-05-29 LAB — LIPID PANEL
Cholesterol: 78 mg/dL (ref 0–200)
HDL: 37 mg/dL — ABNORMAL LOW (ref >39)
LDL Cholesterol: 22 mg/dL (ref 0–99)
Total CHOL/HDL Ratio: 2.1 RATIO
VLDL: 19 mg/dL (ref 0–40)

## 2010-05-29 LAB — URINE MICROSCOPIC-ADD ON

## 2010-05-29 LAB — CHLORIDE, URINE, RANDOM: Chloride Urine: 13 mEq/L

## 2010-05-29 LAB — MAGNESIUM: Magnesium: 2.1 mg/dL (ref 1.5–2.5)

## 2010-05-30 LAB — CK TOTAL AND CKMB (NOT AT ARMC)
CK, MB: 2.6 ng/mL (ref 0.3–4.0)
Relative Index: 1.3 (ref 0.0–2.5)
Relative Index: INVALID (ref 0.0–2.5)
Total CK: 126 U/L (ref 7–177)

## 2010-05-30 LAB — URINALYSIS, ROUTINE W REFLEX MICROSCOPIC
Glucose, UA: >1000 mg/dL — AB
Ketones, ur: 40 mg/dL — AB
Leukocytes, UA: NEGATIVE
Nitrite: NEGATIVE
Specific Gravity, Urine: 1.016 (ref 1.005–1.030)
pH: 7 (ref 5.0–8.0)

## 2010-05-30 LAB — DIFFERENTIAL
Basophils Absolute: 0 10*3/uL (ref 0.0–0.1)
Basophils Relative: 0 % (ref 0–1)
Eosinophils Relative: 0 % (ref 0–5)
Lymphocytes Relative: 8 % — ABNORMAL LOW (ref 12–46)
Neutro Abs: 14 10*3/uL — ABNORMAL HIGH (ref 1.7–7.7)

## 2010-05-30 LAB — TSH: TSH: 1.836 u[IU]/mL (ref 0.350–4.500)

## 2010-05-30 LAB — GLUCOSE, CAPILLARY
Glucose-Capillary: 145 mg/dL — ABNORMAL HIGH (ref 70–99)
Glucose-Capillary: 204 mg/dL — ABNORMAL HIGH (ref 70–99)
Glucose-Capillary: 234 mg/dL — ABNORMAL HIGH (ref 70–99)
Glucose-Capillary: 262 mg/dL — ABNORMAL HIGH (ref 70–99)

## 2010-05-30 LAB — COMPREHENSIVE METABOLIC PANEL
AST: 25 U/L (ref 0–37)
Albumin: 3.8 g/dL (ref 3.5–5.2)
Alkaline Phosphatase: 101 U/L (ref 39–117)
BUN: 4 mg/dL — ABNORMAL LOW (ref 6–23)
BUN: 7 mg/dL (ref 6–23)
CO2: 26 mEq/L (ref 19–32)
Chloride: 101 mEq/L (ref 96–112)
Creatinine, Ser: 0.72 mg/dL (ref 0.4–1.2)
Creatinine, Ser: 0.76 mg/dL (ref 0.4–1.2)
GFR calc non Af Amer: >60 mL/min (ref >60)
Total Bilirubin: 1 mg/dL (ref 0.3–1.2)
Total Protein: 6.7 g/dL (ref 6.0–8.3)

## 2010-05-30 LAB — SEDIMENTATION RATE: Sed Rate: 5 mm/hr (ref 0–22)

## 2010-05-30 LAB — CBC
HCT: 41.4 % (ref 36.0–46.0)
HCT: 43.2 % (ref 36.0–46.0)
MCV: 91.1 fL (ref 78.0–100.0)
MCV: 91.4 fL (ref 78.0–100.0)
Platelets: 217 10*3/uL (ref 150–400)
Platelets: 218 10*3/uL (ref 150–400)
RDW: 12.8 % (ref 11.5–15.5)
RDW: 12.9 % (ref 11.5–15.5)

## 2010-05-30 LAB — HOMOCYSTEINE: Homocysteine: 11.9 umol/L (ref 4.0–15.4)

## 2010-05-30 LAB — LIPID PANEL: VLDL: 25 mg/dL (ref 0–40)

## 2010-05-30 LAB — HEMOGLOBIN A1C
Hgb A1c MFr Bld: 9 % — ABNORMAL HIGH (ref 4.6–6.1)
Mean Plasma Glucose: 212 mg/dL

## 2010-05-30 LAB — PROTIME-INR: Prothrombin Time: 12.7 seconds (ref 11.6–15.2)

## 2010-05-30 LAB — RPR: RPR Ser Ql: NONREACTIVE

## 2010-05-30 LAB — URINE MICROSCOPIC-ADD ON

## 2010-05-30 LAB — TROPONIN I: Troponin I: 0.02 ng/mL (ref 0.00–0.06)

## 2010-07-07 NOTE — Op Note (Signed)
NAMEVARNELL, Sarah Skinner              ACCOUNT NO.:  [id number]   MEDICAL RECORD NO.:  [id number]          PATIENT TYPE:  AMB   LOCATION:  DAY                          FACILITY:  [facility]   PHYSICIAN:  Currie Paris, M.D.DATE OF BIRTH:  03-12-1940   DATE OF PROCEDURE:  02/12/2008  DATE OF DISCHARGE:                               OPERATIVE REPORT   Office medical [phone].   PREOPERATIVE DIAGNOSIS:  Incisional umbilical hernia.   POSTOPERATIVE DIAGNOSIS:  Incisional umbilical hernia.   OPERATION:  Repair of incisional umbilical hernia.   SURGEON:  Currie Paris, M.D.   ANESTHESIA:  General.   CLINICAL HISTORY:  This is a 70 year old lady that has what looks like  an incisional hernia right at her umbilicus.  Is bothered by her and she  wished to have this fixed.  She had some other abdominal complaints that  we told her did not think related to her umbilical incision.   DESCRIPTION OF PROCEDURE:  The patient was seen in the holding area and  had no further questions.  We confirmed umbilical hernia repair was the  planned procedure.  I palpated the defect and it seemed to be right at  the umbilicus.  I examined her both supine and standing to be sure we  had a good definition of the area.  She also has noted somewhat of a  diastasis recti.   The patient was then taken to the operating room and after satisfactory  general anesthesia had been obtained, the abdomen was prepped and  draped.  The time-out was done.   I made a vertical incision going around the umbilicus because I was not  sure how complex this defect was going to be.  I divided the  subcutaneous tissue until I could see the defect which was directly  under the umbilicus.  I divided subcutaneous tissue above the defect  down to fascia and then elevated the subcutaneous tissue off of the  fascia circumferentially so I that could get a good definition of the  actual defect.  The defect itself had some scar  tissue on top of it and  measured about 2.5 cm.  I took out a little of what I thought was scar  and hernia sac and had the edges nicely defined.   I had the fascia exposed for a distance of about 2 inches in all  directions around the defect.  I then closed the defect with 3 sutures  of 0 Prolene and this closed easily and with no tension.  I then took a  3 x 6 inch piece of Marlex mesh and sutured it, anchoring it  circumferentially at the fascial edges where I had them exposed, so I  used about 4-1/2 of the total 6 inch length, and I used all of the width  in doing this.   This appeared to have everything repaired nicely.  I could not identify  any other defects, at least as far as I had everything exposed here.   I irrigated and made sure everything was dry.  I put 0.5% plain Marcaine  in to help with postop pain relief.  I closed the deeper subcutaneous  tissue with some 2-0 Vicryl, tacking it down to the fascia and mesh to  try to close off the dead space and reduce the chance of a postoperative  seroma.  The subcu was then closed with another layer of 2-0 Vicryl and  the skin with 4-0 Monocryl subcuticular.  Sterile dressings were  applied.  I put a cotton ball with some mineral oil in the umbilicus to  try to keep the shape there.   The patient tolerated the procedure well and there were no  complications.  All counts were correct.      Currie Paris, M.D.  Electronically Signed     CJS/MEDQ  D:  02/12/2008  T:  02/12/2008  Job:  [id number]   cc:   Alfredia Client, MD  Fax: [phone]

## 2010-07-07 NOTE — H&P (Signed)
Sarah Skinner, TADROS NO.:  [id number]   MEDICAL RECORD NO.:  [id number]          PATIENT TYPE:  INP   LOCATION:  1824                         FACILITY:  [facility]   PHYSICIAN:  Massie Maroon, MD        DATE OF BIRTH:  October 26, 1940   DATE OF ADMISSION:  10/21/2008  DATE OF DISCHARGE:                              HISTORY & PHYSICAL   CHIEF COMPLAINT:  I've got weakness.   HISTORY OF PRESENT ILLNESS:  A 70 year old female with a chief complaint  of weakness.  She has a history of prior TIAs.  Apparently, she had had  several days of feeling slightly weak and apparently went to Urgent Care  for some nausea, vomiting, diarrhea, was sent home and then went to  sleep at midnight and awoke at about 3:30 a.m. with left-sided weakness  and facial droop.  She was unable to walk according to the ED records  because of the weakness, and EMS apparently found her was left-sided  weakness and facial droop, but no change in her speech.  The patient was  brought to the ED for further evaluation.  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  The patient was seen and evaluated by  neurology who recommended that she be admitted for probable right  subcortical small CVA.  The patient will be admitted for CVA/TIA.   In the ED, she had also complained of some nausea, vomiting, diarrhea,  so she has had a CT scan for the abdomen and pelvis which is still  pending.  She apparently has some history of diverticulosis.  We are  awaiting results of this CT scan.   PAST MEDICAL HISTORY:  1. Hypertension.  2. Hyperlipidemia.  3. Glaucoma (not requiring medications).  4. TIA in 2004.  5. Diverticulitis.  6. Fibromyalgia.  7. Chronic back pain.   PAST SURGICAL HISTORY:  1. T&A  2. Hernia repair.  3. Cholecystectomy.   SOCIAL HISTORY:  The patient does not smoke or drink.  She is married,  and she lives at  home.   FAMILY HISTORY:  Her mother and her oldest sister have both had strokes.  There were both diabetics.   ALLERGIES:  1. PENICILLINS.  2. SULFONAMIDES.   MEDICATIONS:  1. Glipizide 5 mg p.o. daily.  2. Amlodipine 10 mg p.o. daily.  3. Benazepril 20 mg p.o. daily.  (The patient states that she is not taking Xanax and Prozac at this  time.)   REVIEW OF SYSTEMS:  Negative for all 10 organ systems except for  pertinent positives stated above.  Specifically, she does not complain  of any bright red blood per rectum or black stool.   PHYSICAL EXAMINATION:  VITAL SIGNS:  Temperature 97, pulse 101,  respiratory rate 16, blood pressure 149/63, pulse oximetry 99% on room  air.  HEENT: Anicteric, extraocular movements intact, no nystagmus, pupils 1.5  mm, symmetric, direct, __________ reflexes intact.  Mucous membranes  moist.  Tongue midline.  NECK:  No  JVD, no bruit, no thyromegaly, no adenopathy.  HEART:  Regular rate and rhythm.  S1-S2.  No murmurs, gallops or rubs.  LUNGS:  Clear to auscultation bilaterally.  ABDOMEN:  Soft, nontender, nondistended.  Positive bowel sounds.  EXTREMITIES:  No cyanosis, clubbing or edema.  SKIN:  No rashes.  LYMPH NODES:  No adenopathy.  NEUROLOGIC:  The patient has mild left lower facial weakness and some  left pronator drift with decreased hand grip.  Otherwise, lower  extremity strength is normal.  Deep tendon reflexes are 2+ symmetric,  diffuse, with upgoing toe on the left and downgoing toe on the right.   LABORATORY DATA:  WBC is 15.8, hemoglobin 14.8, platelet count 218.  Sodium 137, potassium 3.5, chloride 101, bicarbonate 26, BUN 7,  creatinine 0.76, AST 25, ALT 28, alkaline phosphatase 101, total  bilirubin 1.0, CPK 84, CK-MB 1.9, troponin-I of 0.02.   CT brain showed no acute intracranial abnormality.   EKG was tachycardic at 109, normal axis, prolonged QTC of 469, some  slight ST sag in V4 to V6 which appears to the old.    ASSESSMENT/PLAN:  1. Transient ischemic attack/cerebrovascular accident:  The patient      will be started on aspirin 81 mg p.o. daily.  She was not      previously on any antiplatelet agents.  She will also be started on      Crestor 20 mg p.o. q.h.s.  We will check a fasting lipid panel,      hemoglobin A1c and ESR, RPR, B12, ANA, as well as an MRI brain with      and without contrast, carotid ultrasound with transcranial Doppler,      cardiac 2-D echo.  Since she is somewhat hypertensive, we will add      carvedilol 3.125 mg p.o. b.i.d. along with her amlodipine and      benazepril.  2. Diabetes:  Fingerstick blood sugars a.c. and bedtime, insulin      sensitive NovoLog sliding scale.  Continue glipizide for now.  3. Hypertension:  Continue amlodipine and benazepril and add      carvedilol as stated above.  4. Nausea, vomiting, diarrhea and abdominal pain (she apparently has      given different stories to ED and EMS).  CT scan of the abdomen and      pelvis is pending.  We will check stool studies in case she is      having some diarrhea, and if any further nausea and vomiting      occurs, then we will consider further workup at that time.      The patient will be started on Protonix 40 mg p.o. daily, and will      use Zofran 4 mg IV q.4h. p.r.n. nausea and vomiting.  5. DVT prophylaxis:  Lovenox 40 mg subcutaneous daily.      Massie Maroon, MD  Electronically Signed     JYK/MEDQ  D:  10/21/2008  T:  10/21/2008  Job:  [id number]   cc:   Alfredia Client, M.D.

## 2010-07-07 NOTE — Consult Note (Signed)
NAMECLOTEE, SCHLICKER NO.:  [id number]   MEDICAL RECORD NO.:  [id number]          PATIENT TYPE:  INP   LOCATION:  1824                         FACILITY:  [facility]   PHYSICIAN:  Noel Christmas, MD    DATE OF BIRTH:  Aug 08, 1940   DATE OF CONSULTATION:  DATE OF DISCHARGE:                                 CONSULTATION   REFERRING PHYSICIAN:  Eber Hong, MD, [facility].   REASON FOR CONSULTATION:  Acute left-sided weakness and slurred speech.   HISTORY OF PRESENT ILLNESS:  This is a 70 year old lady who presented as  code stroke at 39 today.  However, in obtaining further history from  her husband, she apparently started having weakness, late morning  yesterday while at church.  She was noted to be dragging her leg.  The  exact onset of slurred speech is unclear.  CT scan of her head showed no  acute intracranial abnormality.  The patient had multiple small lacunar  infarctions, but has not had a clinically documented clear-cut stroke.  There was no evidence of intracranial hemorrhage.  The patient has not  been on antiplatelet therapy.  There has been no improvement in her  symptoms since onset.   PAST MEDICAL HISTORY:  Remarkable for hypertension, diabetes mellitus,  diverticulitis, hyponatremia, chronic low back pain, and fibromyalgia.  She also has depression and anxiety.   CURRENT MEDICATIONS:  1. Alprazolam 0.5 mg twice a day.  2. Prozac 40 mg per day.   The patient apparently is not on any hypertensive medications nor any  medication for diabetes at this point.   FAMILY HISTORY:  Positive for myocardial infarctions as well as stroke.   PHYSICAL EXAMINATION:  Appearance is that of an obese, slightly elderly-  appearing lady who was alert and cooperative in no acute distress.  She  was well-oriented to time as well as place.  She had no receptive nor  expressive aphasia.  Pupils, extraocular movements and visual fields  were normal.  She  had mild left lower facial weakness.  Hearing was  normal.  Speech was slightly dysarthric.  Motor exam showed left upper  extremity pronator drift as well as reduced handgrip.  Left lower  extremity strength was normal.  Strength of right extremities was  normal.  Deep tendon reflexes were normal and symmetrical except for  absent ankle reflexes.  The patient had left Babinski sign.  Sensory  exam was normal.  Carotid auscultation was normal.   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   RECOMMENDATIONS:  1. MRI and MRA of the brain without contrast.  2. Carotid Doppler study.  3. Echocardiogram.  4. Aspirin 81 mg per day.  5. Physical therapy and occupational therapy consults.   Thank you for asking me to evaluate Ms. Alameda.      Noel Christmas, MD  Electronically Signed     CS/MEDQ  D:  10/21/2008  T:  10/21/2008  Job:  [id number]

## 2010-07-10 NOTE — Procedures (Signed)
[place]  Patient:    Sarah Skinner, Sarah Skinner Visit Number: [id number] MRN: [id number]          Service Type: END Location: ENDO Attending Physician:  Charna Elizabeth Dictated by:   Anselmo Rod, M.D. Proc. Date: 01/26/01 Admit Date:  01/25/2001   CC:         Sheronette A. Cherly Hensen, M.D.  Julieanne Manson, M.D.   Procedure Report  DATE OF BIRTH:  1940/06/29  REFERRING PHYSICIAN:  Sheronette A. Cherly Hensen, M.D.  PROCEDURE PERFORMED:  Esophagogastroduodenoscopy.  ENDOSCOPIST:  Anselmo Rod, M.D.  INSTRUMENT USED:  Olympus video panendoscope.  INDICATIONS FOR PROCEDURE:  Black stool and epigastric pain in a 70 year old white female rule out peptic ulcer disease, esophagitis, gastritis, etc.  PREPROCEDURE PREPARATION:  Informed consent was procured from the patient. The patient was fasted for eight hours prior to the procedure.  PREPROCEDURE PHYSICAL:  The patient had stable vital signs.  Neck supple. Chest clear to auscultation.  S1, S2 regular.  Abdomen soft with normal bowel sounds.  DESCRIPTION OF PROCEDURE:  The patient was placed in left lateral decubitus position and sedated with 50 mg of Demerol and 7.5 mg of Versed intravenously. Once the patient was adequately sedated and maintained on low-flow oxygen and continuous cardiac monitoring, the Olympus video panendoscope was advanced through the mouthpiece, over the tongue, into the esophagus under direct vision.  The entire esophagus appeared normal without evidence of ring, stricture, masses, lesions, esophagitis, or Barretts mucosa.  The scope was then advanced to the stomach.  A small hiatal hernia was seen on high retroflexion.  There was mild antral gastritis.  No ulcers, erosions, masses or polyps were present.  The proximal small bowel appeared normal.  IMPRESSION: 1. Small hiatal hernia. 2. Mild antral gastritis. 3. No erosions or ulcerations.  RECOMMENDATION: 1.  Repeat guaiac testing will be done on an outpatient basis and further    recommendations made as needed. 2. Avoid all nonsteroidals including aspirin for now. 3. Follow antireflux measures. 4. Outpatient follow-up in the next two weeks. Dictated by:   Anselmo Rod, M.D. Attending Physician:  Charna Elizabeth DD:  01/26/01 TD:  01/26/01 Job: [id number]

## 2010-07-10 NOTE — H&P (Signed)
[name], Skinner              ACCOUNT NO.:  [id number]   MEDICAL RECORD NO.:  [id number]          PATIENT TYPE:  INP   LOCATION:  1608                         FACILITY:  [facility]   PHYSICIAN:  Mobolaji B. Bakare, M.D.DATE OF BIRTH:  1940/10/21   DATE OF ADMISSION:  06/11/2006  DATE OF DISCHARGE:                              HISTORY & PHYSICAL   CHIEF COMPLAINT:  Back ache and confusion.   HISTORY OF PRESENTING COMPLAINT:  Sarah Skinner is a 70 year old  Caucasian female with history of chronic back pain. She had multiple  back injuries when she was working according to her report.  She started  experiencing back pain about two years ago.  She had an MRI of the  lumbar spine in March 2006 which showed stenotic changes involving the  foramina and lateral recesses at L5-S1 and __________  of L5-S1, marked  encroachment of the L5 nerve roots in the foramina.  The patient had  multiple epidural injections consequently.  The pain recurred again  quite severely in the past 10 months, also in the last couple of weeks.  She was seen in the emergency room on April 13 with complaint of back  pain.  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  She  was sent home on Vicodin and Skelaxin.  The patient states that the pain  is not getting any better.  She rates the pain 11/10.  She had fallen  while getting out of bed yesterday.  The patient uses a walker at home.  She became confused today and the family called EMS.  The patient  attributes this confusion to new onset which started after using eye  medication.  She attributed it to prednisone.  I believe prednisone was  given to her by primary care physician in the last one week.   Initially laboratory data done in the emergency room revealed a sodium  of 114 and chloride of 78.  She has a normal BUN and creatinine.  She  admitted that she has been  vomiting.  She vomited two to three times  today.  She had some diarrhea, but no abdominal pain. No fever or  chills.   REVIEW OF SYSTEMS:  She denies fever, chills, cough, chest pain.  No  constipation.  No rash, shortness of breath, orthopnea or PND.   PAST MEDICAL HISTORY:  1. Chronic back pain.  2. Diverticulitis.  3. Hypertension.  4. Fibromyalgia.   CURRENT MEDICATIONS:  1. Vicodin 5/500 one q.6h. p.r.n.  2. Skelaxin 800 mg.  3. Prednisone 40 mg b.i.d.  4. Glipizide 5 mg b.i.d.  5. Avapro 450 mg daily.   ALLERGIES:  PENICILLIN, SULFA.   FAMILY HISTORY:  Both parents had heart disease.  Father passed away  from MI at the age of 84.  Mother died of CVA at the age of 40.   SOCIAL HISTORY:  The patient is an ex-smoker.  She does not drink  alcohol or smoke cigarettes.  She is married and lives with her husband.  She  worked in [employer] when she was younger and she reported multiple  back injuries from falls.   PHYSICAL EXAMINATION:  VITAL SIGNS:  Temperature 97.9, blood pressure  165/86, pulse 91, respiratory rate 16, oxygen saturation 98%.  GENERAL:  On examination, the patient is awake, alert, oriented to time,  place and person.  HEENT:  Normocephalic, atraumatic head.  Pupils equal, round, and  reactive to light.  Extraocular muscles intact.  Mucous membranes dry.  NECK:  No elevated JVD.  No carotid bruits.  LUNGS:  Clear to auscultation.  CARDIOVASCULAR:  S1 and S2 regular.  ABDOMEN:  Obese, soft, nontender.  Bowel sounds present.  BACK:  Lumbosacral has no tenderness palpable.  Now lying supine  position.  MUSCULOSKELETAL:  The patient was able to elevate both lower extremities  up to 70 degrees and above  CNS:  No lower extremity weakness.  Muscle power 5/5 in all limbs.   INITIAL LABORATORY DATA:  WBC 17.7, hemoglobin 14.4, hematocrit 41.3,  platelets 395,000.  Neutrophil count 10.4.  Sodium 114, chloride 78,  potassium 3.7, bicarb 26, glucose 142, BUN 15,  creatinine 0.68, total  bilirubin 1.5, alk phos 98, AFP 26, ALT 23, total protein 6, albumin 3,  calcium 8.7.  Urinalysis unremarkable except for urine glucose  __________ , specific gravity 1.015.  Urine drug screen was not normal.  CT scan:  No acute intracranial abnormality, chronic __________  disease.   ASSESSMENT/PLAN:  [name] is a 70 year old Caucasian female  presenting with acute on chronic back pain, hyponatremia, vomiting and  diarrhea.   ADMISSION DIAGNOSES:  1. Hyponatremia.  Etiology is multifactorial including pain, nausea,      volume depletion from diarrhea and vomiting.  Her mental status is      quite good for this degree of hyponatremia.  Will repeat BMET.      Check urine osmolality and serum osmolality to rule out syndrome of      inappropriate antidiuretic hormone.  Check TSH.  Will give normal      saline at 125 mL per hour.  I am holding Cymbalta which may      aggravate hyponatremia. Will institute free water restriction.      Will check BMET q.8h.  2. Acute on chronic back pain.  This appears to have been aggravated      by recent fall yesterday.  Will obtain an MRI of the lumbar spine      to assess and compare to previous MRI in 2006.  We will control      pain with Percocet 5/325 one to two p.o. q.4 h. p.r.n. for mild to      moderate pain, divided 0.5 to 1 mg IV q.4 h. p.r.n. for severe      pain.  I am tapering prednisone as the patient attributes her      confusion to this medication, although her confusion may be      attributed to severe hyponatremia.  3. Confusion, likely secondary to severe hyponatremia.  Will treat      underlying cause.  4. Hypokalemia. We will supplement with potassium chloride.  5. Nausea and vomiting, diarrhea, probably viral gastroenteritis.      Will check stool studies and treat symptomatically.  She does not      have abdominal pain. 6. Leukocytosis.  Likely secondary to steroids.  Will monitor.  There      is no  fever.  7. Hypertension.  This is uncontrolled,  likely related to pain.  We      will resume Avapro 2 mg daily.  8. Diabetes mellitus.  Will resume glipizide and cover with sliding      scale insulin while on prednisone.      Mobolaji B. Corky Downs, M.D.  Electronically Signed     MBB/MEDQ  D:  06/12/2006  T:  06/12/2006  Job:  [id number]   cc:   [facility]

## 2010-07-10 NOTE — Discharge Summary (Signed)
Sarah Skinner, Sarah Skinner              ACCOUNT NO.:  [id number]   MEDICAL RECORD NO.:  [id number]          PATIENT TYPE:  INP   LOCATION:  1537                         FACILITY:  [facility]   PHYSICIAN:  Ladell Pier, M.D.   DATE OF BIRTH:  Dec 22, 1940   DATE OF ADMISSION:  06/11/2006  DATE OF DISCHARGE:  06/15/2006                               DISCHARGE SUMMARY   ADMISSION DIAGNOSES:  1. Chronic back pain with confusion.  2. Hyponatremia.  3. Nausea, vomiting, diarrhea, suggestive of viral gastroenteritis.  4. Leukocytosis secondary to steroid use.  5. Hypertension.  6. Diabetes.  7. History of diverticulitis.  8. Fibromyalgia.   DISCHARGE MEDICATIONS:  1. Vicodin 5/500 q.6h. p.r.n.  2. Skelaxin 800 p.r.n.  3. Prednisone taper 30 mg x3 days, 20 mg x3 days, 10 mg x3 days and      then stop.  4. Avapro 150 mg daily.  5. Glipizide 5 mg twice daily.   FOLLOWUP APPOINTMENTS:  The patient to follow up with primary care  physician and Dr. Phylliss Bob.   CONSULTATIONS:  None.   PROCEDURE:  None.   HISTORY OF PRESENT ILLNESS:  The patient is a 70 year old white female  with a history of chronic back pain for which she sees Dr. Phylliss Bob,  presently getting steroid injections and is on steroid treatment  secondary to her pain.  She has had multiple back injuries when she was  working according to her report.  She had an MRI done last year.  She  fell while getting out of bed using a walker at home, she became  confused, family called EMS.  The patient states that her confusion is  secondary to the prednisone.   Laboratories done in the ED showed a sodium of 114 and a chloride of 78.   Past medical history, family history, social history, medications,  allergies, review of systems as per admission H&P.   PHYSICAL EXAMINATION ON DISCHARGE:  VITAL SIGNS:  Temperature 97.4,  pulse 81, respirations 20, blood pressure 145/89.  CVG 130-202.  HEENT: Head is normocephalic, atraumatic.  Pupils equal,  round and  reactive to light.  Throat without erythema.  CARDIOVASCULAR: Regular rate and rhythm.  LUNGS:  Clear bilaterally.  ABDOMEN:  Positive bowel sounds.  EXTREMITIES:  No edema.   HOSPITAL COURSE:  1. Nausea, vomiting/diarrhea.  The patient was admitted, given IV      fluids.  Her symptoms resolved.  2. Hyponatremia.  This is most likely secondary to her diarrhea.  The      patient's sodium was measured.  She was given Normal Saline for      fluid repletion.  Her sodium came back up into the 130's prior to      discharge.  3. Chronic back pain.  She was complained of back pain and MRI was      repeated of her lower back which showed that she has some disk      bulge and spinal stenosis.  She was told to follow up with Dr. Phylliss Bob      for continued treatment of her back pain  and possibly referred to      Neurosurgery.  4. Diabetes.  Blood sugar was mildly elevated while she was in the      hospital most likely secondary to the stress and prednisone.  We      tapered her off the prednisone because she thought it was causing      her just not to feel well.  5. Hypertension.  Blood pressure was elevated in the hospital.      Increased her Avapro to 300 mg daily.  The patient tolerated this      well.  The patient to follow up with her primary care physician for      adjustment of her medication.   DISPOSITION:  The patient was discharged home with home OT and PT.   DISCHARGE FOLLOWUP:  She will follow up with her primary care physician  as well.   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  Right paracentral  disk herniation at T12-L1 that indents the thecal sac, but not does not  appear to result in gross neural compression.  Sodium 135, potassium 4,  chloride 98, CO2 of 30, glucose 154, BUN 10, creatinine 0.92, calcium  10,  WBC 18.2, hemoglobin 15.7, platelets 359, hemoglobin A1c 7.7.      Ladell Pier, M.D.  Electronically Signed     NJ/MEDQ  D:  06/15/2006  T:  06/15/2006  Job:  [id number]

## 2010-11-23 ENCOUNTER — Emergency Department (HOSPITAL_COMMUNITY): Payer: PRIVATE HEALTH INSURANCE

## 2010-11-23 ENCOUNTER — Emergency Department (HOSPITAL_COMMUNITY)
Admission: EM | Admit: 2010-11-23 | Discharge: 2010-11-23 | Disposition: A | Discharge status: Home / Self Care | Payer: PRIVATE HEALTH INSURANCE | Attending: Emergency Medicine | Admitting: Emergency Medicine

## 2010-11-23 DIAGNOSIS — F411 Generalized anxiety disorder: Secondary | ICD-10-CM | POA: Insufficient documentation

## 2010-11-23 DIAGNOSIS — R109 Unspecified abdominal pain: Secondary | ICD-10-CM | POA: Insufficient documentation

## 2010-11-23 DIAGNOSIS — E119 Type 2 diabetes mellitus without complications: Secondary | ICD-10-CM | POA: Insufficient documentation

## 2010-11-23 DIAGNOSIS — Z9089 Acquired absence of other organs: Secondary | ICD-10-CM | POA: Insufficient documentation

## 2010-11-23 DIAGNOSIS — Z8673 Personal history of transient ischemic attack (TIA), and cerebral infarction without residual deficits: Secondary | ICD-10-CM | POA: Insufficient documentation

## 2010-11-23 DIAGNOSIS — R110 Nausea: Secondary | ICD-10-CM | POA: Insufficient documentation

## 2010-11-23 DIAGNOSIS — R10819 Abdominal tenderness, unspecified site: Secondary | ICD-10-CM | POA: Insufficient documentation

## 2010-11-23 LAB — DIFFERENTIAL
Basophils Relative: 0 % (ref 0–1)
Monocytes Relative: 8 % (ref 3–12)
Neutro Abs: 9.4 10*3/uL — ABNORMAL HIGH (ref 1.7–7.7)
Neutrophils Relative %: 72 % (ref 43–77)

## 2010-11-23 LAB — BASIC METABOLIC PANEL
CO2: 32 mEq/L (ref 19–32)
Chloride: 95 mEq/L — ABNORMAL LOW (ref 96–112)
Creatinine, Ser: 0.72 mg/dL (ref 0.50–1.10)
Potassium: 3.6 mEq/L (ref 3.5–5.1)
Sodium: 136 mEq/L (ref 135–145)

## 2010-11-23 LAB — GLUCOSE, CAPILLARY: Glucose-Capillary: 184 mg/dL — ABNORMAL HIGH (ref 70–99)

## 2010-11-23 LAB — OCCULT BLOOD, POC DEVICE: Fecal Occult Bld: NEGATIVE

## 2010-11-23 LAB — URINALYSIS, ROUTINE W REFLEX MICROSCOPIC
Ketones, ur: NEGATIVE mg/dL
Leukocytes, UA: NEGATIVE
Nitrite: NEGATIVE
Protein, ur: NEGATIVE mg/dL

## 2010-11-23 LAB — ETHANOL: Alcohol, Ethyl (B): <11 mg/dL (ref 0–11)

## 2010-11-23 LAB — RAPID URINE DRUG SCREEN, HOSP PERFORMED: Amphetamines: NOT DETECTED

## 2010-11-23 LAB — CBC
Hemoglobin: 14 g/dL (ref 12.0–15.0)
MCH: 30.7 pg (ref 26.0–34.0)
RBC: 4.56 MIL/uL (ref 3.87–5.11)

## 2010-11-24 LAB — URINE CULTURE: Culture  Setup Time: 201210020119

## 2010-11-27 LAB — CBC
HCT: 39.9 % (ref 36.0–46.0)
Hemoglobin: 13.4 g/dL (ref 12.0–15.0)
MCHC: 33.7 g/dL (ref 30.0–36.0)
RBC: 4.4 MIL/uL (ref 3.87–5.11)
RDW: 12.8 % (ref 11.5–15.5)

## 2010-11-27 LAB — URINALYSIS, ROUTINE W REFLEX MICROSCOPIC
Bilirubin Urine: NEGATIVE
Glucose, UA: NEGATIVE mg/dL
Hgb urine dipstick: NEGATIVE
Ketones, ur: NEGATIVE mg/dL
pH: 6.5 (ref 5.0–8.0)

## 2010-11-27 LAB — GLUCOSE, CAPILLARY
Glucose-Capillary: 198 mg/dL — ABNORMAL HIGH (ref 70–99)
Glucose-Capillary: 227 mg/dL — ABNORMAL HIGH (ref 70–99)

## 2010-11-27 LAB — COMPREHENSIVE METABOLIC PANEL
Alkaline Phosphatase: 89 U/L (ref 39–117)
BUN: 12 mg/dL (ref 6–23)
CO2: 32 mEq/L (ref 19–32)
Calcium: 9.3 mg/dL (ref 8.4–10.5)
GFR calc non Af Amer: >60 mL/min (ref >60)
Glucose, Bld: 191 mg/dL — ABNORMAL HIGH (ref 70–99)
Total Protein: 6.4 g/dL (ref 6.0–8.3)

## 2010-11-27 LAB — DIFFERENTIAL
Lymphocytes Relative: 24 % (ref 12–46)
Monocytes Absolute: 0.5 10*3/uL (ref 0.1–1.0)
Monocytes Relative: 8 % (ref 3–12)
Neutro Abs: 4.6 10*3/uL (ref 1.7–7.7)

## 2010-12-19 ENCOUNTER — Emergency Department (HOSPITAL_COMMUNITY)
Admission: EM | Admit: 2010-12-19 | Discharge: 2010-12-19 | Disposition: A | Discharge status: Home / Self Care | Payer: PRIVATE HEALTH INSURANCE | Attending: Emergency Medicine | Admitting: Emergency Medicine

## 2010-12-19 ENCOUNTER — Encounter: Payer: Self-pay | Admitting: Emergency Medicine

## 2010-12-19 ENCOUNTER — Emergency Department (HOSPITAL_COMMUNITY): Payer: PRIVATE HEALTH INSURANCE

## 2010-12-19 DIAGNOSIS — Z9079 Acquired absence of other genital organ(s): Secondary | ICD-10-CM | POA: Insufficient documentation

## 2010-12-19 DIAGNOSIS — E119 Type 2 diabetes mellitus without complications: Secondary | ICD-10-CM | POA: Insufficient documentation

## 2010-12-19 DIAGNOSIS — Z79899 Other long term (current) drug therapy: Secondary | ICD-10-CM | POA: Insufficient documentation

## 2010-12-19 DIAGNOSIS — K649 Unspecified hemorrhoids: Secondary | ICD-10-CM | POA: Insufficient documentation

## 2010-12-19 DIAGNOSIS — I10 Essential (primary) hypertension: Secondary | ICD-10-CM | POA: Insufficient documentation

## 2010-12-19 DIAGNOSIS — Z794 Long term (current) use of insulin: Secondary | ICD-10-CM | POA: Insufficient documentation

## 2010-12-19 DIAGNOSIS — Z8673 Personal history of transient ischemic attack (TIA), and cerebral infarction without residual deficits: Secondary | ICD-10-CM | POA: Insufficient documentation

## 2010-12-19 DIAGNOSIS — K5900 Constipation, unspecified: Secondary | ICD-10-CM | POA: Insufficient documentation

## 2010-12-19 HISTORY — DX: Essential (primary) hypertension: I10

## 2010-12-19 HISTORY — DX: Cerebral infarction, unspecified: I63.9

## 2010-12-19 LAB — GLUCOSE, CAPILLARY: Glucose-Capillary: 115 mg/dL — ABNORMAL HIGH (ref 70–99)

## 2010-12-19 NOTE — ED Notes (Signed)
ems called for transport. nad

## 2010-12-19 NOTE — ED Provider Notes (Signed)
History     CSN: [id number] Arrival date & time: 12/19/2010  1:10 PM   First MD Initiated Contact with Patient 12/19/10 1336      Chief Complaint  Patient presents with  . Constipation    (Consider location/radiation/quality/duration/timing/severity/associated sxs/prior treatment) HPI  Patient presents via EMS with her husband. Has a history of chronic constipation and takes Colace 200 mg twice a day and MiraLAX once daily in the morning she relates her last BM was about 3-4 days ago she had a small stool 2 days ago. This morning she was having difficulty calling EMS was called however right before they arrived she passed a large round hard ball and now she feels better. Husband relates she has a history of rectal prolapse and they're concerned her rectum has prolapsed again. She had nausea without vomiting no fever she states she did have diffuse abdominal pain. This is a chronic problem.  Past Medical History  Diagnosis Date  . Hypertension   . Diabetes mellitus   . Stroke    left him a paracentesis  Past Surgical History  Procedure Date  . Abdominal hysterectomy     History reviewed. No pertinent family history.  History  Substance Use Topics  . Smoking status: Not on file  . Smokeless tobacco: Not on file  . Alcohol Use: No   lives at home with husband Impaired mobility  OB History    Grav Para Term Preterm Abortions TAB SAB Ect Mult Living                  Review of Systems  All other systems reviewed and are negative.    Allergies  Penicillins and Sulfa antibiotics  Home Medications   Current Outpatient Rx  Name Route Sig Dispense Refill  . ALPRAZOLAM 0.5 MG PO TABS Oral Take 0.5 mg by mouth 3 (three) times daily.      Marland Kitchen DICLOFENAC SODIUM 50 MG PO TBEC Oral Take 50 mg by mouth 2 (two) times daily.      Marland Kitchen DOCUSATE SODIUM 100 MG PO CAPS Oral Take 200 mg by mouth 2 (two) times daily.      Marland Kitchen ESCITALOPRAM OXALATE 20 MG PO TABS Oral Take 20 mg by mouth  daily.      . INSULIN ASPART 100 UNIT/ML [place] SOLN Subcutaneous Inject 3 Units into the skin 3 (three) times daily before meals. Injects 3 units every morning, dinner (noon), and supper.     . INSULIN GLARGINE 100 UNIT/ML [place] SOLN Subcutaneous Inject 31 Units into the skin at bedtime.      Marland Kitchen LACTOBACILLUS RHAMNOSUS (GG) 10 B CELL PO CAPS Oral Take 1 capsule by mouth daily.      Marland Kitchen LEVOTHYROXINE SODIUM 50 MCG PO TABS Oral Take 50 mcg by mouth daily.      Marland Kitchen LORATADINE 10 MG PO TABS Oral Take 10 mg by mouth daily.      Marland Kitchen METOPROLOL TARTRATE 50 MG PO TABS Oral Take 50 mg by mouth daily.      Marland Kitchen ONE-DAILY MULTI VITAMINS PO TABS Oral Take 1 tablet by mouth daily with lunch.      Marland Kitchen FISH OIL 1000 MG PO CAPS Oral Take 1 capsule by mouth daily with lunch.      Marland Kitchen PANTOPRAZOLE SODIUM 40 MG PO TBEC Oral Take 40 mg by mouth daily.      Marland Kitchen POTASSIUM CHLORIDE CRYS CR 20 MEQ PO TBCR Oral Take 20 mEq by mouth daily with  lunch.      . PRESCRIPTION MEDICATION Subcutaneous Inject 30 Units into the skin at bedtime. Unknown name of insulin. Will follow up with pharmacy     . ROSUVASTATIN CALCIUM 10 MG PO TABS Oral Take 10 mg by mouth every other day. Takes every other night.     . SOLIFENACIN SUCCINATE 5 MG PO TABS Oral Take 5 mg by mouth daily.      Marland Kitchen ZOLPIDEM TARTRATE 5 MG PO TABS Oral Take 5 mg by mouth at bedtime.      Marland Kitchen AMLODIPINE BESY-BENAZEPRIL HCL 5-20 MG PO CAPS Oral Take 1 capsule by mouth daily.      Marland Kitchen CETIRIZINE HCL 10 MG PO TABS Oral Take 10 mg by mouth daily.     Marland Kitchen DARIFENACIN HYDROBROMIDE 15 MG PO TB24 Oral Take 15 mg by mouth daily.      Marland Kitchen FEXOFENADINE HCL 180 MG PO TABS Oral Take 180 mg by mouth daily.      Marland Kitchen GLIPIZIDE 5 MG PO TABS Oral Take 5 mg by mouth daily.      Marland Kitchen HYDROXYZINE HCL 25 MG PO TABS Oral Take 25 mg by mouth 3 (three) times daily as needed.      Marland Kitchen OMEPRAZOLE MAGNESIUM 20 MG PO TBEC Oral Take 20 mg by mouth daily.        BP 180/67  Pulse 78  Temp(Src) 97.9 F (36.6 C) (Oral)  Resp 20   SpO2 98%  Vital signs show hypertension otherwise normal  Physical Exam  Constitutional: She is oriented to person, place, and time. She appears well-developed and well-nourished.       Obese  HENT:  Head: Normocephalic and atraumatic.  Eyes: Conjunctivae and EOM are normal. Pupils are equal, round, and reactive to light.  Cardiovascular: Normal rate, regular rhythm and normal heart sounds.   Pulmonary/Chest: Effort normal and breath sounds normal.  Abdominal: Soft. Bowel sounds are normal. She exhibits no distension and no mass. There is no tenderness. There is no rebound and no guarding.       She was rolled over onto her side she started having a few hemorrhoids that are not actively inflamed. She does not have any rectal prolapse at this time.  Musculoskeletal:       Left hemiparesis  Neurological: She is alert and oriented to person, place, and time.  Skin: Skin is warm and dry.  Psychiatric: She has a normal mood and affect. Her behavior is normal.    ED Course  Procedures (including critical care time)  Labs Reviewed  GLUCOSE, CAPILLARY - Abnormal; Notable for the following:    Glucose-Capillary 115 (*)    All other components within normal limits   Dg Abd Acute W/chest  12/19/2010  *RADIOLOGY REPORT*  Clinical Data: Constipation  ACUTE ABDOMEN SERIES (ABDOMEN 2 VIEW & CHEST 1 VIEW)  Comparison: 11/23/2010  Findings: Mild cardiomegaly stable.  Lungs clear.  No effusion.  No definite free air. Normal bowel gas pattern.  Vascular clips in the right upper abdomen.  Regional bones unremarkable.  IMPRESSION:  1.  Nonobstructive bowel gas pattern. 2.  No free air. 3.  Stable mild cardiomegaly.  Original Report Authenticated By: Osa Craver, M.D.    Recheck at 1315 shows patient asking to eat she states she feels fine and is ready to go home   1. Constipation     Plan husband is to increase the MiraLAX to twice a day. He states she has intermittent  diarrhea and  constipation.  MDM   Devoria Albe, MD, Armando Gang         Ward Givens, MD 12/19/10 [phone]

## 2010-12-19 NOTE — ED Notes (Addendum)
PT states she has been constipated with some abd pain x4days but had large bm while EMS at house to bring here. Pt deneis abd pain now and stats she feels better. Husband and pt feels her rectum is prolapsing-no prolapse noted upon assessment. Alert/oriented. nad at this time. PT c/o chronic bilateral knee pain at present.

## 2011-03-16 ENCOUNTER — Encounter: Payer: Self-pay | Admitting: Cardiovascular Disease

## 2011-03-26 ENCOUNTER — Encounter: Payer: Self-pay | Admitting: Cardiovascular Disease

## 2011-04-04 ENCOUNTER — Emergency Department (HOSPITAL_COMMUNITY): Payer: PRIVATE HEALTH INSURANCE

## 2011-04-04 ENCOUNTER — Emergency Department (HOSPITAL_COMMUNITY)
Admission: EM | Admit: 2011-04-04 | Discharge: 2011-04-04 | Disposition: A | Discharge status: Home / Self Care | Payer: PRIVATE HEALTH INSURANCE | Attending: Emergency Medicine | Admitting: Emergency Medicine

## 2011-04-04 DIAGNOSIS — E119 Type 2 diabetes mellitus without complications: Secondary | ICD-10-CM | POA: Insufficient documentation

## 2011-04-04 DIAGNOSIS — Z8673 Personal history of transient ischemic attack (TIA), and cerebral infarction without residual deficits: Secondary | ICD-10-CM | POA: Insufficient documentation

## 2011-04-04 DIAGNOSIS — Z794 Long term (current) use of insulin: Secondary | ICD-10-CM | POA: Insufficient documentation

## 2011-04-04 DIAGNOSIS — R259 Unspecified abnormal involuntary movements: Secondary | ICD-10-CM | POA: Insufficient documentation

## 2011-04-04 DIAGNOSIS — R251 Tremor, unspecified: Secondary | ICD-10-CM

## 2011-04-04 DIAGNOSIS — I10 Essential (primary) hypertension: Secondary | ICD-10-CM | POA: Insufficient documentation

## 2011-04-04 DIAGNOSIS — Z79899 Other long term (current) drug therapy: Secondary | ICD-10-CM | POA: Insufficient documentation

## 2011-04-04 LAB — URINALYSIS, ROUTINE W REFLEX MICROSCOPIC
Ketones, ur: NEGATIVE mg/dL
Leukocytes, UA: NEGATIVE
Nitrite: NEGATIVE
Protein, ur: NEGATIVE mg/dL
Urobilinogen, UA: 0.2 mg/dL (ref 0.0–1.0)
pH: 7.5 (ref 5.0–8.0)

## 2011-04-04 LAB — CBC
MCHC: 34.5 g/dL (ref 30.0–36.0)
RDW: 13.1 % (ref 11.5–15.5)

## 2011-04-04 LAB — DIFFERENTIAL
Basophils Absolute: 0.1 10*3/uL (ref 0.0–0.1)
Basophils Relative: 1 % (ref 0–1)
Monocytes Absolute: 0.8 10*3/uL (ref 0.1–1.0)
Neutro Abs: 7.2 10*3/uL (ref 1.7–7.7)
Neutrophils Relative %: 64 % (ref 43–77)

## 2011-04-04 LAB — BASIC METABOLIC PANEL
Chloride: 98 mEq/L (ref 96–112)
Creatinine, Ser: 0.77 mg/dL (ref 0.50–1.10)
GFR calc Af Amer: >90 mL/min (ref >90)
GFR calc non Af Amer: 84 mL/min — ABNORMAL LOW (ref >90)

## 2011-04-04 MED ORDER — ALPRAZOLAM 0.5 MG PO TABS
1.0000 mg | ORAL_TABLET | Freq: Once | ORAL | Status: AC
  Administered 2011-04-04: 1 mg via ORAL
  Filled 2011-04-04: qty 2

## 2011-04-04 MED ORDER — POTASSIUM CHLORIDE CRYS ER 20 MEQ PO TBCR
20.0000 meq | EXTENDED_RELEASE_TABLET | Freq: Once | ORAL | Status: AC
  Administered 2011-04-04: 20 meq via ORAL
  Filled 2011-04-04: qty 1

## 2011-04-04 NOTE — ED Notes (Signed)
Woke during the night with facial and neck tremor, denies pain. Pt w/ hx of stroke w/ left sided hemiparesis, mild expressive aphrasia. Pt also indicates some mid lateral abd pain. Husband indicates has hx of constipation, problem and pain w/ hemorroids. Also yeast infection per husband on buttocks

## 2011-04-04 NOTE — ED Notes (Signed)
[id number]<AV> Expected date:04/04/11<BR> Expected time: 9:21 AM<BR> Means of arrival:Ambulance<BR> Comments:<BR> EMS Weakness, wounds

## 2011-04-04 NOTE — ED Provider Notes (Addendum)
History     CSN: [id number]  Arrival date & time 04/04/11  0913   First MD Initiated Contact with Patient 04/04/11 [phone]      No chief complaint on file.   (Consider location/radiation/quality/duration/timing/severity/associated sxs/prior treatment) HPI Patient states she began shaking in neck and chin began at 0600.  Come and goes.  No decreased loc.  States didn't sleep at all last night because she couldn't go to sleep.  Patient states she missed her "nerve" pill at 8 p.m. Last night because it wasn't in pouch.  States she took it this a.m.- takes xanax 0.5 mg three times per day.  Husband states this has happened off and on before but not this bad.  Patient not evaluated for same before.  Husband states he thinks it is usually her sugar at night but it was 105  This a.m.  PMD is Dr. Modesto Charon.   Past Medical History  Diagnosis Date  . Hypertension   . Diabetes mellitus   . Stroke     Past Surgical History  Procedure Date  . Abdominal hysterectomy     No family history on file.  History  Substance Use Topics  . Smoking status: Not on file  . Smokeless tobacco: Not on file  . Alcohol Use: No    OB History    Grav Para Term Preterm Abortions TAB SAB Ect Mult Living                  Review of Systems  All other systems reviewed and are negative.    Allergies  Penicillins and Sulfa antibiotics  Home Medications   Current Outpatient Rx  Name Route Sig Dispense Refill  . ALPRAZOLAM 0.5 MG PO TABS Oral Take 0.5 mg by mouth 3 (three) times daily.      Marland Kitchen AMLODIPINE BESY-BENAZEPRIL HCL 5-20 MG PO CAPS Oral Take 1 capsule by mouth daily.      Marland Kitchen CETIRIZINE HCL 10 MG PO TABS Oral Take 10 mg by mouth daily.     Marland Kitchen DARIFENACIN HYDROBROMIDE ER 15 MG PO TB24 Oral Take 15 mg by mouth daily.      Marland Kitchen DICLOFENAC SODIUM 50 MG PO TBEC Oral Take 50 mg by mouth 2 (two) times daily.      Marland Kitchen DOCUSATE SODIUM 100 MG PO CAPS Oral Take 200 mg by mouth 2 (two) times daily.      Marland Kitchen ESCITALOPRAM  OXALATE 20 MG PO TABS Oral Take 20 mg by mouth daily.      Marland Kitchen FEXOFENADINE HCL 180 MG PO TABS Oral Take 180 mg by mouth daily.      Marland Kitchen GLIPIZIDE 5 MG PO TABS Oral Take 5 mg by mouth daily.      Marland Kitchen HYDROXYZINE HCL 25 MG PO TABS Oral Take 25 mg by mouth 3 (three) times daily as needed.      . INSULIN ASPART 100 UNIT/ML [place] SOLN Subcutaneous Inject 3 Units into the skin 3 (three) times daily before meals. Injects 3 units every morning, dinner (noon), and supper.     . INSULIN GLARGINE 100 UNIT/ML [place] SOLN Subcutaneous Inject 31 Units into the skin at bedtime.      Marland Kitchen LACTOBACILLUS RHAMNOSUS (GG) 10 B CELL PO CAPS Oral Take 1 capsule by mouth daily.      Marland Kitchen LEVOTHYROXINE SODIUM 50 MCG PO TABS Oral Take 50 mcg by mouth daily.      Marland Kitchen LORATADINE 10 MG PO TABS Oral Take 10 mg  by mouth daily.      Marland Kitchen METOPROLOL TARTRATE 50 MG PO TABS Oral Take 50 mg by mouth daily.      Marland Kitchen ONE-DAILY MULTI VITAMINS PO TABS Oral Take 1 tablet by mouth daily with lunch.      Marland Kitchen FISH OIL 1000 MG PO CAPS Oral Take 1 capsule by mouth daily with lunch.      . OMEPRAZOLE MAGNESIUM 20 MG PO TBEC Oral Take 20 mg by mouth daily.      Marland Kitchen PANTOPRAZOLE SODIUM 40 MG PO TBEC Oral Take 40 mg by mouth daily.      Marland Kitchen POTASSIUM CHLORIDE CRYS ER 20 MEQ PO TBCR Oral Take 20 mEq by mouth daily with lunch.      Marland Kitchen PRESCRIPTION MEDICATION Subcutaneous Inject 30 Units into the skin at bedtime. Unknown name of insulin. Will follow up with pharmacy     . ROSUVASTATIN CALCIUM 10 MG PO TABS Oral Take 10 mg by mouth every other day. Takes every other night.     . SOLIFENACIN SUCCINATE 5 MG PO TABS Oral Take 5 mg by mouth daily.      Marland Kitchen ZOLPIDEM TARTRATE 5 MG PO TABS Oral Take 5 mg by mouth at bedtime.        BP 168/78  Pulse 82  Temp(Src) 99 F (37.2 C) (Oral)  Resp 16  SpO2 98%  Physical Exam  Nursing note and vitals reviewed. Constitutional: She appears well-developed and well-nourished.  HENT:  Head: Normocephalic and atraumatic.  Right Ear: External  ear normal.  Left Ear: External ear normal.  Nose: Nose normal.  Mouth/Throat: Oropharynx is clear and moist.  Eyes: Conjunctivae and EOM are normal. Pupils are equal, round, and reactive to light.  Neck: Normal range of motion. Neck supple.  Cardiovascular: Normal rate and regular rhythm.   Pulmonary/Chest: Effort normal and breath sounds normal.  Abdominal: Soft.  Musculoskeletal: Normal range of motion.  Neurological: She is alert. She displays tremor. She exhibits abnormal muscle tone.       Left upper extremity with contracture and splint.  lle weaker than right but still able to lift against gravity.   Skin: Skin is warm and dry.       Erythema bilateral thighs and labia c.w. candida    ED Course  Procedures (including critical care time)  Labs Reviewed - No data to display No results found.   No diagnosis found.  Results for orders placed during the hospital encounter of 04/04/11  CBC      Component Value Range   WBC 11.3 (*) 4.0 - 10.5 (K/uL)   RBC 4.32  3.87 - 5.11 (MIL/uL)   Hemoglobin 13.5  12.0 - 15.0 (g/dL)   HCT 16.1  09.6 - 04.5 (%)   MCV 90.5  78.0 - 100.0 (fL)   MCH 31.3  26.0 - 34.0 (pg)   MCHC 34.5  30.0 - 36.0 (g/dL)   RDW 40.9  81.1 - 91.4 (%)   Platelets 209  150 - 400 (K/uL)  DIFFERENTIAL      Component Value Range   Neutrophils Relative 64  43 - 77 (%)   Neutro Abs 7.2  1.7 - 7.7 (K/uL)   Lymphocytes Relative 28  12 - 46 (%)   Lymphs Abs 3.1  0.7 - 4.0 (K/uL)   Monocytes Relative 7  3 - 12 (%)   Monocytes Absolute 0.8  0.1 - 1.0 (K/uL)   Eosinophils Relative 1  0 - 5 (%)  Eosinophils Absolute 0.1  0.0 - 0.7 (K/uL)   Basophils Relative 1  0 - 1 (%)   Basophils Absolute 0.1  0.0 - 0.1 (K/uL)  BASIC METABOLIC PANEL      Component Value Range   Sodium 137  135 - 145 (mEq/L)   Potassium 3.4 (*) 3.5 - 5.1 (mEq/L)   Chloride 98  96 - 112 (mEq/L)   CO2 31  19 - 32 (mEq/L)   Glucose, Bld 123 (*) 70 - 99 (mg/dL)   BUN 9  6 - 23 (mg/dL)    Creatinine, Ser 0.98  0.50 - 1.10 (mg/dL)   Calcium 9.7  8.4 - 11.9 (mg/dL)   GFR calc non Af Amer 84 (*) >90 (mL/min)   GFR calc Af Amer >90  >90 (mL/min)  URINALYSIS, ROUTINE W REFLEX MICROSCOPIC      Component Value Range   Color, Urine YELLOW  YELLOW    APPearance CLOUDY (*) CLEAR    Specific Gravity, Urine 1.013  1.005 - 1.030    pH 7.5  5.0 - 8.0    Glucose, UA NEGATIVE  NEGATIVE (mg/dL)   Hgb urine dipstick NEGATIVE  NEGATIVE    Bilirubin Urine NEGATIVE  NEGATIVE    Ketones, ur NEGATIVE  NEGATIVE (mg/dL)   Protein, ur NEGATIVE  NEGATIVE (mg/dL)   Urobilinogen, UA 0.2  0.0 - 1.0 (mg/dL)   Nitrite NEGATIVE  NEGATIVE    Leukocytes, UA NEGATIVE  NEGATIVE    Dg Chest 2 View  04/04/2011  *RADIOLOGY REPORT*  Clinical Data: Weakness, altered mental status  CHEST - 2 VIEW  Comparison: 11/23/2008  Findings: Cardiomediastinal silhouette is stable.  No acute infiltrate or pleural effusion.  No pulmonary edema.  Bony thorax is stable.  Atherosclerotic calcifications of thoracic aorta again noted.  IMPRESSION:  No active disease.  No significant change.  Original Report Authenticated By: Natasha Mead, M.D.   Ct Head Wo Contrast  04/04/2011  *RADIOLOGY REPORT*  Clinical Data: Weakness.  Hypertension diabetes and stroke history.  CT HEAD WITHOUT CONTRAST  Technique:  Contiguous axial images were obtained from the base of the skull through the vertex without contrast.  Comparison: CT 02/04/2010, MRI 10/21/2008  Findings: Moderately advanced chronic microvascular ischemia in the cerebral white matter bilaterally.  Ventricle size is mildly prominent but stable.  Chronic infarct in the left anterior thalamus.  Question hypodensity left pons which may be artifact or infarct.  If this is an  infarct it appears to be chronic.  Negative for hemorrhage or mass lesion.  Calvarium is intact.  IMPRESSION: Chronic ischemic changes.  No acute abnormality.  Original Report Authenticated By: Camelia Phenes, M.D.     Patient missed the Xanax dose last night and became very shaky this morning. She did have some tremor upon my evaluation. She has left-sided weakness but this is stable from prior stroke. The tremor was chiefly in the neck and chin. This does not appear to represent any focal deficit. She did take her Xanax and route here and is feeling better. No other complaints besides the rash. Workup does not reveal any acute abnormalities. Results are discussed with her husband. They have an understanding in agreement with being discharged home.        Hilario Quarry, MD 04/04/11 1351  Hilario Quarry, MD 04/05/11 1044

## 2011-04-14 ENCOUNTER — Encounter: Payer: 59 | Admitting: Cardiovascular Disease

## 2011-04-16 ENCOUNTER — Encounter: Payer: Self-pay | Admitting: *Deleted

## 2011-04-16 ENCOUNTER — Ambulatory Visit (INDEPENDENT_AMBULATORY_CARE_PROVIDER_SITE_OTHER): Payer: PRIVATE HEALTH INSURANCE | Admitting: Cardiovascular Disease

## 2011-04-16 ENCOUNTER — Encounter: Payer: Self-pay | Admitting: Cardiovascular Disease

## 2011-04-16 DIAGNOSIS — E119 Type 2 diabetes mellitus without complications: Secondary | ICD-10-CM

## 2011-04-16 DIAGNOSIS — I10 Essential (primary) hypertension: Secondary | ICD-10-CM

## 2011-04-16 DIAGNOSIS — I6350 Cerebral infarction due to unspecified occlusion or stenosis of unspecified cerebral artery: Secondary | ICD-10-CM

## 2011-04-16 DIAGNOSIS — I639 Cerebral infarction, unspecified: Secondary | ICD-10-CM

## 2011-04-16 NOTE — Progress Notes (Signed)
Sarah Skinner Date of Birth  May 08, 1940 [facility]     [place] Office  [address]    [address]   [address] [place], [place]  [address]    [place], [place]  [address] [phone]  Fax  [phone]  [phone]  Fax [phone]  Problem List: 1. Hypertension 2. Diabetes Mellitus 3. Hypothyroidism 4. Hyperlipidemia 5. CVA- August , 2010  History of Present Illness:  Sarah Skinner is a 71 yo with the above noted medical problems.  We are asked to see her for pre-op evaluation prior to moderate conscious sedation for a urology procedure.  Description is completely wheelchair-bound. She's had a right hemispheric stroke and has almost complete paralysis of the left side of her body. She also has dementia and urinary incontinence. She is scheduled to have a urology procedure for her urinary incontinence.  She's not really had any episodes of angina. She does not get any exercise. She denies any shortness of breath. She denies any syncope or presyncope.   Current Outpatient Prescriptions on File Prior to Visit  Medication Sig Dispense Refill  . ALPRAZolam (XANAX) 0.5 MG tablet Take 0.5 mg by mouth 3 (three) times daily.        . diclofenac (VOLTAREN) 50 MG EC tablet Take 50 mg by mouth 2 (two) times daily.        Marland Kitchen docusate sodium (COLACE) 100 MG capsule Take 200 mg by mouth 4 (four) times daily.       Marland Kitchen escitalopram (LEXAPRO) 20 MG tablet Take 20 mg by mouth daily.        . fexofenadine (ALLEGRA) 180 MG tablet Take 180 mg by mouth daily.        . insulin aspart (NOVOLOG) 100 UNIT/ML injection Inject 3 Units into the skin 3 (three) times daily before meals. Injects 3 units every morning, dinner (noon), and supper.       . insulin glargine (LANTUS) 100 UNIT/ML injection Inject 31 Units into the skin at bedtime.        Marland Kitchen levothyroxine (SYNTHROID, LEVOTHROID) 50 MCG tablet Take 50 mcg by mouth daily.        Marland Kitchen loratadine (CLARITIN) 10 MG tablet Take 10 mg by mouth  daily.        . metoprolol (LOPRESSOR) 50 MG tablet Take 50 mg by mouth 2 (two) times daily.       . Multiple Vitamin (MULTIVITAMIN) tablet Take 1 tablet by mouth daily with lunch.        . Omega-3 Fatty Acids (FISH OIL) 1000 MG CAPS Take 1 capsule by mouth daily with lunch.        . potassium chloride SA (K-DUR,KLOR-CON) 20 MEQ tablet Take 20 mEq by mouth daily with lunch.        . solifenacin (VESICARE) 5 MG tablet Take 5 mg by mouth daily.        . vitamin C (ASCORBIC ACID) 500 MG tablet Take 500 mg by mouth daily.      . vitamin E 400 UNIT capsule Take 400 Units by mouth daily.      Marland Kitchen zolpidem (AMBIEN) 5 MG tablet Take 5 mg by mouth at bedtime.          Allergies  Allergen Reactions  . Penicillins Hives    Red splotches all over body  . Sulfa Antibiotics Hives    Red splotches all over body    Past Medical History  Diagnosis Date  .  Hypertension   . Diabetes mellitus   . Stroke     Past Surgical History  Procedure Date  . Abdominal hysterectomy     History  Smoking status  . Former Smoker  Smokeless tobacco  . Not on file  Comment: Quite 20 Yrs Ago    History  Alcohol Use No    No family history on file.  Reviw of Systems:  Reviewed in the HPI.  All other systems are negative.  Physical Exam: Blood pressure 166/80, pulse 73, height 5\' 2"  (1.575 m), weight 190 lb (86.183 kg). General: Well developed, well nourished, in no acute distress.  Head: Normocephalic, atraumatic, sclera non-icteric, mucus membranes are moist,   Neck: Supple. Carotids are 2 + without bruits. No JVD  Lungs: Clear bilaterally to auscultation.  Heart: regular rate  With normal  S1 S2. No murmurs, gallops or rubs.  Abdomen: Soft, non-tender, non-distended with normal bowel sounds. No hepatomegaly. No rebound/guarding. No masses.  Msk:  Strength and tone are normal  Extremities: No clubbing or cyanosis. No edema.  Distal pedal pulses are 2+ and equal bilaterally.  Neuro: Alert  and oriented X 3. Moves all extremities spontaneously.  Psych:  Responds to questions appropriately with a normal affect.  ECG: Normal sinus rhythm. Nonspecific ST and T wave abnormalities.  Assessment / Plan:

## 2011-04-16 NOTE — Assessment & Plan Note (Signed)
Sarah Skinner presents today for a preoperative evaluation prior to having urologic surgery. She has several risk factors for coronary artery disease including hypertension, hyperlipidemia, and a previous stroke.  She is wheelchair-bound. She does not get any exercise. She also has some underlying dementia. She's not a candidate for any invasive procedures.  She is not actively having angina and she's not in congestive heart failure at present.  Using the [facility] criteria I think it we can make a fairly good risk assessment.  If the urologic procedure can be performed with moderate conscious sedation then she is at no increased risk for any cardiovascular complications. If Dr. Annabell Howells has to use general anesthesia think she would be at moderate risk for cardiovascular complications.    We'll not need to see her back for followup visit that she will follow up with Dr.Wong for further management of her medical issues.

## 2011-04-16 NOTE — Patient Instructions (Signed)
Your physician recommends that you schedule a follow-up appointment as needed with Dr. Nahser  

## 2011-04-30 ENCOUNTER — Encounter: Payer: Self-pay | Admitting: Cardiovascular Disease

## 2011-05-04 ENCOUNTER — Other Ambulatory Visit: Payer: Self-pay | Admitting: Urology

## 2011-05-05 ENCOUNTER — Encounter (HOSPITAL_COMMUNITY): Payer: Self-pay | Admitting: Pharmacy Technician

## 2011-05-11 ENCOUNTER — Encounter (HOSPITAL_COMMUNITY)
Admission: RE | Admit: 2011-05-11 | Discharge: 2011-05-11 | Disposition: A | Discharge status: Home / Self Care | Payer: PRIVATE HEALTH INSURANCE | Source: Ambulatory Visit | Attending: Urology | Admitting: Urology

## 2011-05-11 ENCOUNTER — Encounter (HOSPITAL_COMMUNITY): Payer: Self-pay

## 2011-05-11 HISTORY — DX: Fibromyalgia: M79.7

## 2011-05-11 HISTORY — DX: Anxiety disorder, unspecified: F41.9

## 2011-05-11 HISTORY — DX: Encounter for other specified aftercare: Z51.89

## 2011-05-11 HISTORY — DX: Gastro-esophageal reflux disease without esophagitis: K21.9

## 2011-05-11 HISTORY — DX: Reserved for inherently not codable concepts without codable children: IMO0001

## 2011-05-11 HISTORY — DX: Hypothyroidism, unspecified: E03.9

## 2011-05-11 HISTORY — DX: Headache: R51

## 2011-05-11 HISTORY — DX: Unspecified osteoarthritis, unspecified site: M19.90

## 2011-05-11 LAB — BASIC METABOLIC PANEL
BUN: 9 mg/dL (ref 6–23)
Calcium: 9.5 mg/dL (ref 8.4–10.5)
GFR calc non Af Amer: 69 mL/min — ABNORMAL LOW (ref >90)
Glucose, Bld: 125 mg/dL — ABNORMAL HIGH (ref 70–99)
Sodium: 135 mEq/L (ref 135–145)

## 2011-05-11 LAB — SURGICAL PCR SCREEN: MRSA, PCR: NEGATIVE

## 2011-05-11 LAB — CBC
HCT: 41.2 % (ref 36.0–46.0)
Hemoglobin: 13.7 g/dL (ref 12.0–15.0)
MCH: 30.3 pg (ref 26.0–34.0)
MCHC: 33.3 g/dL (ref 30.0–36.0)

## 2011-05-11 NOTE — Progress Notes (Signed)
05/11/11 1214  OBSTRUCTIVE SLEEP APNEA  Have you ever been diagnosed with sleep apnea through a sleep study? No  Do you snore loudly (loud enough to be heard through closed doors)?  0  Do you often feel tired, fatigued, or sleepy during the daytime? 1  Has anyone observed you stop breathing during your sleep? 0  Do you have, or are you being treated for high blood pressure? 1  BMI more than 35 kg/m2? 1  Age over 70 years old? 1  Neck circumference greater than 40 cm/18 inches? 0  Gender: 0  Obstructive Sleep Apnea Score 4   Score 4 or greater  Updated health history

## 2011-05-11 NOTE — Patient Instructions (Signed)
21 Sarah Skinner  05/11/2011   Your procedure is scheduled on:  05/18/11 1030am-1130am  Report to [facility] at 0800 AM.  Call this number if you have problems the morning of surgery: [phone]   Remember:   Do not eat food:After Midnight.  May have clear liquids:until Midnight .  Marland Kitchen  Take these medicines the morning of surgery with A SIP OF WATER:    Do not wear jewelry, make-up or nail polish.  Do not wear lotions, powders, or perfumes.   Do not shave 48 hours prior to surgery.  Do not bring valuables to the hospital.  Contacts, dentures or bridgework may not be worn into surgery.  .    Patients discharged the day of surgery will not be allowed to drive home.  Name and phone number of your driver:   Special Instructions: CHG Shower Use Special Wash: 1/2 bottle night before surgery and 1/2 bottle morning of surgery. shower chin to toes with CHG.  Wash face and private parts with regular soap.     Please read over the following fact sheets that you were given: MRSA Information, coughing and deep breathing exercises, leg exercises

## 2011-05-11 NOTE — Pre-Procedure Instructions (Signed)
LOV with cardiology on 04/16/11 in EPIC  ECHO 9/10 in EPIC

## 2011-05-11 NOTE — Pre-Procedure Instructions (Signed)
05/11/11 Wheelchair weighed 40 lbs according to specs on computer.  Total weight for pt plus wheelchair is 246.4 lbs.  This is how weight calculated for pt of 206.4 lbs.

## 2011-05-11 NOTE — Pre-Procedure Instructions (Signed)
05/11/11 Pt states blood pressure always elevated at MD visits.  Initial Blood pressure was 204/85.  REcheck of blood pressure at 1140am was 192/98 .  1145-194/97.  1146 am- 182/91.  Pt voiced no complaints of any headaches, dizzines or lightheadedness at preop visit.

## 2011-05-12 ENCOUNTER — Encounter (HOSPITAL_COMMUNITY): Payer: Self-pay

## 2011-05-17 NOTE — H&P (Signed)
ive Problems Problems  1. Incomplete Emptying Of Bladder 788.21 2. Initiating Urination Requires Straining 788.65 3. 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 Urge And Stress Incontinence 788.33  History of Present Illness  Sarah Skinner returns today in f/u from her UDS which showed a 189cc capacity unstable bladder with a low VLPP consistent with ISD.  She had a PVR of only 50cc and had a decent detrusor contraction.  She has had some relief with Vesicare but still has urge and stress incontinence.   Past Medical History Problems  1. History of  Anxiety (Symptom) 300.00 2. History of  Arthritis V13.4 3. History of  Asthma 493.90 4. History of  Depression 311 5. History of  Diabetes Mellitus 250.00 6. History of  Esophageal Reflux 530.81 7. History of  Gastric Ulcer 531.90 8. History of  Glaucoma 365.9 9. History of  Stroke Syndrome 436 10. History of  Stroke Syndrome 436  Surgical History Problems  1. History of  Cholecystectomy 2. History of  Hysterectomy V45.77 3. History of  Neck Surgery 4. History of  Posterior Colporrhaphy (For Pelvic Relaxation)  Current Meds 1. ALPRAZolam 0.5 MG Oral Tablet; Therapy: (Recorded:08Oct2012) to 2. Claritin TABS; Therapy: (Recorded:08Oct2012) to 3. Colace CAPS; Therapy: (Recorded:08Oct2012) to 4. Crestor TABS; Therapy: (Recorded:08Oct2012) to 5. Culturelle CAPS; Therapy: (Recorded:08Oct2012) to 6. Diclofenac Potassium 50 MG Oral Tablet; Therapy: (Recorded:08Oct2012) to 7. Fish Oil CAPS; Therapy: (Recorded:08Oct2012) to 8. Klor-Con 20 MEQ Oral Packet; Therapy: (Recorded:08Oct2012) to 9. Levothyroxine Sodium 50 MCG Oral Tablet; Therapy: (Recorded:08Oct2012) to 10. Lexapro 20 MG Oral Tablet; Therapy: (Recorded:08Oct2012) to 11. Metoprolol Tartrate 50 MG Oral Tablet; Therapy: (Recorded:08Oct2012) to 12. Multi-Vitamin TABS; Therapy: (Recorded:08Oct2012) to 13. Pantoprazole Sodium 40 MG Oral Tablet Delayed Release; Therapy: (Recorded:08Oct2012)  to 14. VESIcare 5 MG Oral Tablet; Therapy: (Recorded:08Oct2012) to 15. Zolpidem Tartrate TABS; Therapy: (Recorded:08Oct2012) to  Allergies Medication  1. Aspirin TABS 2. Penicillins 3. Sulfa Drugs  Family History Problems  1. Family history of  Family Health Status Number Of Children 4 sons 2 daughters 2. Son's history of  Nephrolithiasis  Social History Problems  1. Former Smoker V15.82 smoked for 86yrs quit 45yrs ago 2. Marital History - Currently Married 3. Retired From Work Denied  4. History of  Alcohol Use 5. History of  Caffeine Use  Review of Systems  Gastrointestinal: diarrhea.  Constitutional: no recent weight loss.  Cardiovascular: chest pain.  Respiratory: no shortness of breath.    Physical Exam Constitutional: Well nourished and well developed . No acute distress.  Pulmonary: No respiratory distress and normal respiratory rhythm and effort.  Cardiovascular: Heart rate and rhythm are normal . No peripheral edema.    Results/Data  The following images/tracing/specimen were independently visualized:  I have reviewed her urodynamic tracing, floro and report with pertinent info noted above.    Assessment Assessed  1. Urge And Stress Incontinence 788.33 2. 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   Plan Intrinsic Sphincter Deficiency (599.82)  1. Follow-up Schedule Surgery Office  Follow-up  Requested for: 08Jan2013   I discussed increasing the vesicare vs a Macroplastique injection.   I believe she will be best served by Macroplastique.  She is not a good candidate for a sling.  I reviewed the risks of bleeding, infection, retention, need for second injections, persistent incontinence, thrombotic events and anesthetic complications. She will need a culture prior to the procedure and will need to be cleared by Dr. Modesto Charon.  TIme  in consultation >71min with > 50% face to face.

## 2011-05-18 ENCOUNTER — Ambulatory Visit (HOSPITAL_COMMUNITY): Payer: PRIVATE HEALTH INSURANCE | Admitting: Anesthesiology

## 2011-05-18 ENCOUNTER — Ambulatory Visit (HOSPITAL_COMMUNITY)
Admission: RE | Admit: 2011-05-18 | Discharge: 2011-05-18 | Disposition: A | Discharge status: Home / Self Care | Payer: PRIVATE HEALTH INSURANCE | Source: Ambulatory Visit | Attending: Urology | Admitting: Urology

## 2011-05-18 ENCOUNTER — Encounter (HOSPITAL_COMMUNITY): Payer: Self-pay | Admitting: *Deleted

## 2011-05-18 ENCOUNTER — Encounter (HOSPITAL_COMMUNITY): Payer: Self-pay | Admitting: Anesthesiology

## 2011-05-18 ENCOUNTER — Encounter (HOSPITAL_COMMUNITY)
Admission: RE | Disposition: A | Discharge status: Home / Self Care | Payer: Self-pay | Source: Ambulatory Visit | Attending: Urology

## 2011-05-18 DIAGNOSIS — N3946 Mixed incontinence: Secondary | ICD-10-CM | POA: Insufficient documentation

## 2011-05-18 DIAGNOSIS — R3916 Straining to void: Secondary | ICD-10-CM | POA: Insufficient documentation

## 2011-05-18 DIAGNOSIS — Z79899 Other long term (current) drug therapy: Secondary | ICD-10-CM | POA: Insufficient documentation

## 2011-05-18 DIAGNOSIS — N3642 Intrinsic sphincter deficiency (ISD): Secondary | ICD-10-CM | POA: Insufficient documentation

## 2011-05-18 DIAGNOSIS — E119 Type 2 diabetes mellitus without complications: Secondary | ICD-10-CM | POA: Insufficient documentation

## 2011-05-18 DIAGNOSIS — Z01812 Encounter for preprocedural laboratory examination: Secondary | ICD-10-CM | POA: Insufficient documentation

## 2011-05-18 DIAGNOSIS — R339 Retention of urine, unspecified: Secondary | ICD-10-CM | POA: Insufficient documentation

## 2011-05-18 DIAGNOSIS — K219 Gastro-esophageal reflux disease without esophagitis: Secondary | ICD-10-CM | POA: Insufficient documentation

## 2011-05-18 HISTORY — PX: CYSTOSCOPY WITH INJECTION: SHX1424

## 2011-05-18 LAB — GLUCOSE, CAPILLARY: Glucose-Capillary: 145 mg/dL — ABNORMAL HIGH (ref 70–99)

## 2011-05-18 SURGERY — CYSTOSCOPY, WITH INJECTION OF BLADDER NECK OR BLADDER WALL
Anesthesia: Monitor Anesthesia Care | Site: Urethra | Wound class: Clean Contaminated

## 2011-05-18 MED ORDER — LACTATED RINGERS IV SOLN
INTRAVENOUS | Status: DC | PRN
  Administered 2011-05-18: 10:00:00 via INTRAVENOUS

## 2011-05-18 MED ORDER — MIDAZOLAM HCL 5 MG/5ML IJ SOLN
INTRAMUSCULAR | Status: DC | PRN
  Administered 2011-05-18: 2 mg via INTRAVENOUS

## 2011-05-18 MED ORDER — LIDOCAINE HCL 2 % EX GEL
CUTANEOUS | Status: AC
  Filled 2011-05-18: qty 10

## 2011-05-18 MED ORDER — CIPROFLOXACIN IN D5W 400 MG/200ML IV SOLN
400.0000 mg | INTRAVENOUS | Status: AC
  Administered 2011-05-18: 400 mg via INTRAVENOUS

## 2011-05-18 MED ORDER — PROMETHAZINE HCL 25 MG/ML IJ SOLN: 6.2500 mg | INTRAMUSCULAR | Status: DC | PRN

## 2011-05-18 MED ORDER — LIDOCAINE HCL 1 % IJ SOLN
INTRAMUSCULAR | Status: DC | PRN
  Administered 2011-05-18: 50 mg via INTRADERMAL

## 2011-05-18 MED ORDER — CIPROFLOXACIN IN D5W 400 MG/200ML IV SOLN
INTRAVENOUS | Status: AC
  Filled 2011-05-18: qty 200

## 2011-05-18 MED ORDER — STERILE WATER FOR IRRIGATION IR SOLN
Status: DC | PRN
  Administered 2011-05-18: 3000 mL

## 2011-05-18 MED ORDER — FENTANYL CITRATE 0.05 MG/ML IJ SOLN
INTRAMUSCULAR | Status: DC | PRN
  Administered 2011-05-18: 25 ug via INTRAVENOUS
  Administered 2011-05-18: 50 ug via INTRAVENOUS
  Administered 2011-05-18: 25 ug via INTRAVENOUS

## 2011-05-18 MED ORDER — LIDOCAINE HCL 2 % EX GEL
CUTANEOUS | Status: DC | PRN
  Administered 2011-05-18: 1

## 2011-05-18 MED ORDER — CIPROFLOXACIN HCL 250 MG PO TABS: 250.0000 mg | ORAL_TABLET | Freq: Two times a day (BID) | ORAL | Status: AC

## 2011-05-18 MED ORDER — ONDANSETRON HCL 4 MG/2ML IJ SOLN
INTRAMUSCULAR | Status: DC | PRN
  Administered 2011-05-18: 4 mg via INTRAVENOUS

## 2011-05-18 MED ORDER — LACTATED RINGERS IV SOLN: INTRAVENOUS | Status: DC

## 2011-05-18 MED ORDER — INDIGOTINDISULFONATE SODIUM 8 MG/ML IJ SOLN
INTRAMUSCULAR | Status: AC
  Filled 2011-05-18: qty 5

## 2011-05-18 MED ORDER — PROPOFOL 10 MG/ML IV EMUL
INTRAVENOUS | Status: DC | PRN
  Administered 2011-05-18: 50 ug/kg/min via INTRAVENOUS

## 2011-05-18 MED ORDER — BELLADONNA ALKALOIDS-OPIUM 16.2-60 MG RE SUPP
RECTAL | Status: AC
  Filled 2011-05-18: qty 1

## 2011-05-18 MED ORDER — MEPERIDINE HCL 50 MG/ML IJ SOLN: 6.2500 mg | INTRAMUSCULAR | Status: DC | PRN

## 2011-05-18 MED ORDER — FENTANYL CITRATE 0.05 MG/ML IJ SOLN: 25.0000 ug | INTRAMUSCULAR | Status: DC | PRN

## 2011-05-18 MED ORDER — PHENAZOPYRIDINE HCL 200 MG PO TABS: 200.0000 mg | ORAL_TABLET | Freq: Three times a day (TID) | ORAL | Status: AC | PRN

## 2011-05-18 SURGICAL SUPPLY — 18 items
BAG URO CATCHER STRL LF (DRAPE) ×2 IMPLANT
CLOTH BEACON ORANGE TIMEOUT ST (SAFETY) ×2 IMPLANT
DEFLUX NEEDLE (NEEDLE) IMPLANT
DEFLUX SYRINGE (SYRINGE) IMPLANT
DRAPE CAMERA CLOSED 9X96 (DRAPES) ×2 IMPLANT
GLOVE SURG SS PI 8.0 STRL IVOR (GLOVE) ×2 IMPLANT
GOWN PREVENTION PLUS XLARGE (GOWN DISPOSABLE) ×2 IMPLANT
GOWN STRL REIN XL XLG (GOWN DISPOSABLE) ×2 IMPLANT
INJECTION MACROPLASIQ 2.5ML UN (Female Continence) ×2 IMPLANT
MACROPLASTIQUE 2.5ML UNIT (Female Continence) ×4 IMPLANT
MANIFOLD NEPTUNE II (INSTRUMENTS) ×2 IMPLANT
NDL SAFETY ECLIPSE 18X1.5 (NEEDLE) IMPLANT
NEEDLE HYPO 18GX1.5 SHARP (NEEDLE)
NEEDLE RIGID UROPLASTY (NEEDLE) ×2 IMPLANT
PACK CYSTO (CUSTOM PROCEDURE TRAY) ×2 IMPLANT
SYR CONTROL 10ML LL (SYRINGE) IMPLANT
TUBING CONNECTING 10 (TUBING) IMPLANT
WATER STERILE IRR 3000ML UROMA (IV SOLUTION) ×2 IMPLANT

## 2011-05-18 NOTE — Discharge Instructions (Signed)
Cystoscopy (Bladder Exam) A cystoscopy is an examination of your urinary bladder with a cystoscope. A cystoscope is an instrument like a small telescope with strong lights and lenses. It is inserted into the bladder through the urethra (the opening into the bladder) and allows your caregiver to examine the inside of your bladder. The procedure causes little discomfort and can be done in a hospital or office. It is a diagnostic procedure to evaluate the inside of your bladder. It may involve x-rays to further evaluate the ureters or internal aspects of the kidneys. It may aid in the removal of urinary stones  or in taking tissue samples (biopsies) if necessary. The procedure is easier in females because of a shorter urethra. In a female, the procedure must be done through the penis. This often requires more sedation and more time to do the procedure. The procedure usually takes twenty minutes to one half hour for a female and approximately an hour for a female. LET YOUR CAREGIVERS KNOW ABOUT:  Allergies.   Medications taken including herbs, eye drops, over the counter medications, and creams.   Use of steroids (by mouth or creams).   Previous problems with anesthetics or novocaine.   Possibility of pregnancy, if this applies.   History of blood clots (thrombophlebitis).   History of bleeding or blood problems.   Previous surgery, especially where prosthetics have been used like hip or knee replacements, and heart valve replacements.   Other health problems.  BEFORE THE PROCEDURE  You should be present 60 minutes prior to your procedure or as directed.  PROCEDURE During the procedure, you will:  Be assisted by your urologist and a nurse.   Lie on a cystoscopy table with your knees elevated and legs apart and covered with a drape. For women this is the same position as when a pap smear is taken.   Have the urethral area or penis washed and covered with sterile towels.   Have an anesthetic  (numbing) jelly applied to the urethra. This is usually all that is required for females but males may also require sedation.   Have the cystoscope inserted through the urethra and into the bladder. Sterile fluid will flow through the cystoscope and into the bladder. This will expand the bladder and provide clear fluid for the urologist to look through and examine the interior of the bladder.   Be allowed to go home once you are doing well, are stable, and awake if you were given a sedative. If given a sedative, have someone give you a ride home.  AFTER THE PROCEDURE  You may have temporary bleeding and burning on urination.   Drink lots of fluids.  SEEK IMMEDIATE MEDICAL CARE IF:  There is an increase in blood in the urine or if you are passing clots.   You have difficulty in passing your urine   You develop chills and/or an unexplained oral temperature above 102 F (38.9 C).  Your caregiver will discuss your results with you following the procedure. This may be at a later time if you have been sedated. If other testing or biopsies were taken, ask your caregiver how you are to obtain the results. Remember it is your responsibility to get your results. Do not assume everything is normal if you do not hear from your caregiver. Document Released: 02/06/2000 Document Revised: 01/28/2011 Document Reviewed: 11/30/2007 Exit[employer] Patient Information 2012 [employer]. 

## 2011-05-18 NOTE — Anesthesia Postprocedure Evaluation (Signed)
  Anesthesia Post-op Note  Patient: Sarah Skinner  Procedure(s) Performed: Procedure(s) (LRB): CYSTOSCOPY WITH INJECTION (N/A)  Patient Location: PACU  Anesthesia Type: MAC  Level of Consciousness: awake and alert   Airway and Oxygen Therapy: Patient Spontanous Breathing  Post-op Pain: mild  Post-op Assessment: Post-op Vital signs reviewed, Patient's Cardiovascular Status Stable, Respiratory Function Stable, Patent Airway and No signs of Nausea or vomiting  Post-op Vital Signs: stable  Complications: No apparent anesthesia complications

## 2011-05-18 NOTE — Brief Op Note (Signed)
05/18/2011  1:42 PM  PATIENT:  Sarah Skinner  71 y.o. female  PRE-OPERATIVE DIAGNOSIS:  stress urinary incontinence intrinsic sphincter deficiency  POST-OPERATIVE DIAGNOSIS:  stress urinary incontinence  PROCEDURE:  Procedure(s) (LRB): CYSTOSCOPY WITH INJECTION OF MACROPLASTIQUE (N/A)  SURGEON:  Surgeon(s) and Role:    * Anner Crete, MD - Primary  PHYSICIAN ASSISTANT:   ASSISTANTS: none   ANESTHESIA:   MAC  EBL:  Total I/O In: 840 [P.O.:240; I.V.:600] Out: 155 [Urine:155]  BLOOD ADMINISTERED:none  DRAINS: none   LOCAL MEDICATIONS USED:  LIDOCAINE  and Amount: Jelly 2% 10 ml  SPECIMEN:  No Specimen  DISPOSITION OF SPECIMEN:  N/A  COUNTS:  YES  TOURNIQUET:  * No tourniquets in log *  DICTATION: .Other Dictation: Dictation Number not noted  PLAN OF CARE: Discharge to home after PACU  PATIENT DISPOSITION:  PACU - hemodynamically stable.   Delay start of Pharmacological VTE agent (>24hrs) due to surgical blood loss or risk of bleeding: no

## 2011-05-18 NOTE — Interval H&P Note (Signed)
History and Physical Interval Note:  05/18/2011 9:00 AM  Sarah Skinner Sarah Skinner  has presented today for surgery, with the diagnosis of stress urinary incontinence intrinsic sphincter deficiency  The various methods of treatment have been discussed with the patient and family. After consideration of risks, benefits and other options for treatment, the patient has consented to  Procedure(s) (LRB): CYSTOSCOPY WITH INJECTION (N/A) as a surgical intervention .  The patients' history has been reviewed, patient examined, no change in status, stable for surgery.  I have reviewed the patients' chart and labs.  Questions were answered to the patient's satisfaction.     Ajooni Karam J

## 2011-05-18 NOTE — Transfer of Care (Signed)
Immediate Anesthesia Transfer of Care Note  Patient: Sarah Skinner  Procedure(s) Performed: Procedure(s) (LRB): CYSTOSCOPY WITH INJECTION (N/A)  Patient Location: PACU  Anesthesia Type: MAC  Level of Consciousness: oriented and sedated  Airway & Oxygen Therapy: Patient Spontanous Breathing and Patient connected to face mask oxygen  Post-op Assessment: Report given to PACU RN, Post -op Vital signs reviewed and stable and Patient moving all extremities  Post vital signs: Reviewed and stable  Complications: No apparent anesthesia complications

## 2011-05-18 NOTE — Anesthesia Preprocedure Evaluation (Signed)
Anesthesia Evaluation  Patient identified by MRN, date of birth, ID band Patient awake    Reviewed: Allergy & Precautions, H&P , NPO status , Patient's Chart, lab work & pertinent test results  Airway Mallampati: II TM Distance: >3 FB Neck ROM: Full    Dental No notable dental hx. (+) Edentulous Upper   Pulmonary neg pulmonary ROS,  breath sounds clear to auscultation  Pulmonary exam normal       Cardiovascular hypertension, Pt. on medications negative cardio ROS  Rhythm:Regular Rate:Normal     Neuro/Psych PSYCHIATRIC DISORDERS Anxiety Depression  Neuromuscular disease CVA, Residual Symptoms negative neurological ROS  negative psych ROS   GI/Hepatic negative GI ROS, Neg liver ROS,   Endo/Other  negative endocrine ROSDiabetes mellitus-, Insulin DependentHypothyroidism   Renal/GU negative Renal ROS  negative genitourinary   Musculoskeletal negative musculoskeletal ROS (+) Fibromyalgia -  Abdominal   Peds negative pediatric ROS (+)  Hematology negative hematology ROS (+)   Anesthesia Other Findings   Reproductive/Obstetrics negative OB ROS                           Anesthesia Physical Anesthesia Plan  ASA: III  Anesthesia Plan: MAC   Post-op Pain Management:    Induction: Intravenous  Airway Management Planned:   Additional Equipment:   Intra-op Plan:   Post-operative Plan:   Informed Consent: I have reviewed the patients History and Physical, chart, labs and discussed the procedure including the risks, benefits and alternatives for the proposed anesthesia with the patient or authorized representative who has indicated his/her understanding and acceptance.   Dental advisory given  Plan Discussed with: CRNA  Anesthesia Plan Comments:         Anesthesia Quick Evaluation

## 2011-05-19 NOTE — Op Note (Signed)
[name], Sarah Skinner              ACCOUNT NO.:  [id number]  MEDICAL RECORD NO.:  [id number]  LOCATION:  [facility]                         FACILITY:  [facility]  PHYSICIAN:  Excell Seltzer. Annabell Howells, M.D.    DATE OF BIRTH:  04/19/1940  DATE OF PROCEDURE:  05/18/2011 DATE OF DISCHARGE:  05/18/2011                              OPERATIVE REPORT   PROCEDURE:  Macroplastique implantation.  PREOPERATIVE DIAGNOSIS:  Intrinsic sphincter deficiency with stress incontinence.  POSTOPERATIVE DIAGNOSIS:  Intrinsic sphincter deficiency with stress incontinence.  SURGEON:  Excell Seltzer. Annabell Howells, M.D.  ANESTHESIA:  MAC.  DRAINS:  None.  COMPLICATIONS:  None.  INDICATIONS:  Sarah Skinner is a 71 year old white female with mixed incontinence on urodynamic system, has intrinsic sphincter deficiency with very low leak point pressure and stress incontinence along with instability and urge incontinence.  She was told not to be a good candidate for sling, but the Macroplastique might offer her benefit for the leak point pressure.  FINDINGS OF PROCEDURE:  She had a preoperative culture, which was negative.  She was given Cipro.  She was placed in a lithotomy position and sedation was induced as needed.  Her perineum and genitalia were prepped with Betadine solution.  She was draped in the usual sterile fashion.  The offset injection scope was prepared.  This was fitted with a Macroplastique needle and initial 2.5 cc syringe with the injection handle, the needle  had been primed.  The scope was inserted in the bladder.  Urethra inspected, her urethra __________, no lesions, it was smooth walled.  The orifices were unremarkable.  The initial needle insertion site was chosen in the left mid urethra. The needle was advanced at 45 degree angle to the first mark and then turned parallel and advanced to the second mark.  Half the syringe was injected with bulging of the mucosa.  The needle was then withdrawn after being left  indwelling for 30 seconds post injection.  The second site was chosen in the mid urethra on the right and a similar injection was performed completing the use of initial syringe.  A second syringe was loaded on the needle and injection site at 6 o'clock in mid urethra was chosen.  The needle was advanced at 45 degrees, then turned parallel to the remaining mark to the second mark and 2 additional cc of Macroplastique was instilled.  This caused excellent bulging, still with coaptation.  Of note, the right-sided injection site did have some extrusion at the bladder neck.  Once an adequate implant had been felt to have been performed, the bladder was drained with a 10-French Silastic catheter.  Once the bladder was drained, the patient was taken down from lithotomy position. Her anesthetic was reversed.  She was moved to recovery room in stable condition.  There were no complications.     Excell Seltzer. Annabell Howells, M.D.     JJW/MEDQ  D:  05/18/2011  T:  05/19/2011  Job:  [id number]  cc:   Thelma Barge P. Modesto Charon, M.D.

## 2011-05-21 ENCOUNTER — Encounter (HOSPITAL_COMMUNITY): Payer: Self-pay | Admitting: Urology

## 2011-06-21 ENCOUNTER — Emergency Department (HOSPITAL_COMMUNITY): Payer: PRIVATE HEALTH INSURANCE

## 2011-06-21 ENCOUNTER — Encounter (HOSPITAL_COMMUNITY): Payer: Self-pay | Admitting: Emergency Medicine

## 2011-06-21 ENCOUNTER — Emergency Department (HOSPITAL_COMMUNITY)
Admission: EM | Admit: 2011-06-21 | Discharge: 2011-06-21 | Disposition: A | Discharge status: Home / Self Care | Payer: PRIVATE HEALTH INSURANCE | Attending: Emergency Medicine | Admitting: Emergency Medicine

## 2011-06-21 DIAGNOSIS — Z794 Long term (current) use of insulin: Secondary | ICD-10-CM | POA: Insufficient documentation

## 2011-06-21 DIAGNOSIS — E119 Type 2 diabetes mellitus without complications: Secondary | ICD-10-CM | POA: Insufficient documentation

## 2011-06-21 DIAGNOSIS — R5383 Other fatigue: Secondary | ICD-10-CM | POA: Insufficient documentation

## 2011-06-21 DIAGNOSIS — K219 Gastro-esophageal reflux disease without esophagitis: Secondary | ICD-10-CM | POA: Insufficient documentation

## 2011-06-21 DIAGNOSIS — Z79899 Other long term (current) drug therapy: Secondary | ICD-10-CM | POA: Insufficient documentation

## 2011-06-21 DIAGNOSIS — I10 Essential (primary) hypertension: Secondary | ICD-10-CM | POA: Insufficient documentation

## 2011-06-21 DIAGNOSIS — F29 Unspecified psychosis not due to a substance or known physiological condition: Secondary | ICD-10-CM | POA: Insufficient documentation

## 2011-06-21 DIAGNOSIS — R404 Transient alteration of awareness: Secondary | ICD-10-CM | POA: Insufficient documentation

## 2011-06-21 DIAGNOSIS — E039 Hypothyroidism, unspecified: Secondary | ICD-10-CM | POA: Insufficient documentation

## 2011-06-21 DIAGNOSIS — Z8673 Personal history of transient ischemic attack (TIA), and cerebral infarction without residual deficits: Secondary | ICD-10-CM | POA: Insufficient documentation

## 2011-06-21 DIAGNOSIS — R5381 Other malaise: Secondary | ICD-10-CM | POA: Insufficient documentation

## 2011-06-21 LAB — CBC
MCV: 91.1 fL (ref 78.0–100.0)
Platelets: 190 10*3/uL (ref 150–400)
RBC: 4.47 MIL/uL (ref 3.87–5.11)
WBC: 9.4 10*3/uL (ref 4.0–10.5)

## 2011-06-21 LAB — BASIC METABOLIC PANEL
CO2: 29 mEq/L (ref 19–32)
Calcium: 9.5 mg/dL (ref 8.4–10.5)
Creatinine, Ser: 0.77 mg/dL (ref 0.50–1.10)

## 2011-06-21 LAB — DIFFERENTIAL
Lymphocytes Relative: 33 % (ref 12–46)
Lymphs Abs: 3.1 10*3/uL (ref 0.7–4.0)
Neutro Abs: 5.3 10*3/uL (ref 1.7–7.7)
Neutrophils Relative %: 56 % (ref 43–77)

## 2011-06-21 LAB — URINALYSIS, ROUTINE W REFLEX MICROSCOPIC
Glucose, UA: NEGATIVE mg/dL
Ketones, ur: NEGATIVE mg/dL
Leukocytes, UA: NEGATIVE
Protein, ur: NEGATIVE mg/dL

## 2011-06-21 MED ORDER — SODIUM CHLORIDE 0.9 % IV SOLN
Freq: Once | INTRAVENOUS | Status: AC
  Administered 2011-06-21: 1000 mL via INTRAVENOUS

## 2011-06-21 NOTE — ED Notes (Signed)
Patient transported to CT 

## 2011-06-21 NOTE — ED Notes (Signed)
Elderly female with hx of Stroke causing L sided weakness who presents with short period of confusion this AM when awakened which has since completely resolved - has no focal weakness or numbness worse than baseline on exam, has existing L sided weakness.  No slurred speech, no rash, soft abdomen, clear lungs and heart.  Alert and oriented and answering all questions wtihout difficulty.  VS without sig abnormality  Filed Vitals:   06/21/11 0609  BP: 146/98  Pulse: 73  Temp: 98.2 F (36.8 C)  Resp: 19    CT head r/o CVA though less likely - consider metabolic, infectious.  Medical screening examination/treatment/procedure(s) were conducted as a shared visit with non-physician practitioner(s) and myself.  I personally evaluated the patient during the encounter   Vida Roller, MD 06/21/11 [phone]

## 2011-06-21 NOTE — ED Provider Notes (Signed)
History     CSN: [id number]  Arrival date & time 06/21/11  0559   First MD Initiated Contact with Patient 06/21/11 [phone]      Chief Complaint  Patient presents with  . Cerebrovascular Accident    (Consider location/radiation/quality/duration/timing/severity/associated sxs/prior treatment) Patient is a 71 y.o. female presenting with weakness. The history is provided by the patient and the spouse.  Weakness Primary symptoms do not include fever or vomiting. Primary symptoms comment: Husband called EMS for period of confusion this morning which has since resolved. The symptoms began 1 to 2 hours ago. The symptoms are improving. The neurological symptoms are diffuse.  Additional symptoms include weakness. Associated symptoms comments: The patient reports she just doesn't feel well. She denies pain, known fever, cough, breathing difficulty, headache, N, V or dysuria. . Medical issues also include cerebral vascular accident. Associated medical issues comments: She has had a previous CVA with residual left sided weakness..    Past Medical History  Diagnosis Date  . Hypertension   . Diabetes mellitus   . Hypothyroidism   . Blood transfusion     hx of with childbirth   . Chronic kidney disease     stress incontinence   . GERD (gastroesophageal reflux disease)   . Headache     hx of   . Fibromyalgia   . Anxiety   . Arthritis     hands, feet , knees   . Stroke     left sided weakness     Past Surgical History  Procedure Date  . Abdominal hysterectomy   . Cholecystectomy   . Rectocele repair   . Cervical fusion   . Tubal ligation   . Wisdom tooth extraction   . Cystoscopy with injection 05/18/2011    Procedure: CYSTOSCOPY WITH INJECTION;  Surgeon: Anner Crete, MD;  Location: [facility];  Service: Urology;  Laterality: N/A;  Injection of Macroplastique    History reviewed. No pertinent family history.  History  Substance Use Topics  . Smoking status: Former Smoker    Quit  date: 02/23/1991  . Smokeless tobacco: Never Used   Comment: Quite 20 Yrs Ago  . Alcohol Use: No     hx of no longer uses     OB History    Grav Para Term Preterm Abortions TAB SAB Ect Mult Living                  Review of Systems  Constitutional: Negative for fever and chills.  HENT: Negative.  Negative for congestion.   Respiratory: Negative.  Negative for cough and shortness of breath.   Cardiovascular: Negative.  Negative for chest pain and leg swelling.  Gastrointestinal: Negative.  Negative for vomiting, abdominal pain and diarrhea.  Genitourinary: Negative for dysuria and frequency.  Musculoskeletal: Negative.   Skin: Negative.   Neurological: Positive for weakness.  Psychiatric/Behavioral: Positive for confusion.    Allergies  Aspirin; Penicillins; Sulfa antibiotics; and Latex  Home Medications   Current Outpatient Rx  Name Route Sig Dispense Refill  . ALPRAZOLAM 0.5 MG PO TABS Oral Take 0.5 mg by mouth 3 (three) times daily.     Marland Kitchen DICLOFENAC SODIUM 50 MG PO TBEC Oral Take 50 mg by mouth 2 (two) times daily.     Marland Kitchen DOCUSATE SODIUM 100 MG PO CAPS Oral Take 200 mg by mouth 2 (two) times daily.     Marland Kitchen ESCITALOPRAM OXALATE 20 MG PO TABS Oral Take 20 mg by mouth every morning.     Marland Kitchen  INSULIN ASPART 100 UNIT/ML [place] SOLN Subcutaneous Inject 3 Units into the skin 3 (three) times daily before meals. Injects 3 units every morning, dinner (noon), and supper.    . INSULIN GLARGINE 100 UNIT/ML [place] SOLN Subcutaneous Inject 31 Units into the skin at bedtime.     Marland Kitchen LEVOTHYROXINE SODIUM 50 MCG PO TABS Oral Take 50 mcg by mouth every morning.     Marland Kitchen LORATADINE 10 MG PO TABS Oral Take 10 mg by mouth daily.     Marland Kitchen METOPROLOL TARTRATE 50 MG PO TABS Oral Take 50 mg by mouth 2 (two) times daily.     Marland Kitchen ONE-DAILY MULTI VITAMINS PO TABS Oral Take 1 tablet by mouth daily with lunch.     . MULTIVITAMIN & MINERAL PO Oral Take 1 tablet by mouth daily. Multivitamin with calcium 1000mg , magnesium 400mg   and zinc 15 mg per daughter. Patient takes at nite    . FISH OIL 1000 MG PO CAPS Oral Take 1 capsule by mouth 4 (four) times daily.     Marland Kitchen PANTOPRAZOLE SODIUM 40 MG PO TBEC Oral Take 40 mg by mouth every morning.     Marland Kitchen MIRALAX PO Oral Take 17 g by mouth every morning.     Marland Kitchen POTASSIUM CHLORIDE CRYS ER 20 MEQ PO TBCR Oral Take 20 mEq by mouth daily with lunch.     . ROSUVASTATIN CALCIUM 10 MG PO TABS Oral Take 10 mg by mouth. Pt takes every other nite    . SOLIFENACIN SUCCINATE 5 MG PO TABS Oral Take 5 mg by mouth every morning.     Marland Kitchen VITAMIN C 500 MG PO TABS Oral Take 500 mg by mouth every morning.     Marland Kitchen VITAMIN E 400 UNITS PO CAPS Oral Take 400 Units by mouth every morning.     Marland Kitchen ZOLPIDEM TARTRATE 5 MG PO TABS Oral Take 5 mg by mouth at bedtime.       BP 153/87  Pulse 73  Temp(Src) 98.2 F (36.8 C) (Oral)  Resp 17  SpO2 100%  Physical Exam  Constitutional: She is oriented to person, place, and time. She appears well-developed and well-nourished. No distress.  HENT:  Head: Normocephalic.  Mouth/Throat: Oropharynx is clear and moist.  Neck: Normal range of motion.       No carotid bruit.  Pulmonary/Chest: Effort normal.       No increased effort, diffuse rales, no wheezing.  Abdominal: Soft. There is no tenderness.  Genitourinary:       Mild left sided weakness (h/o stroke with left side deficits).  Neurological: She is alert and oriented to person, place, and time. She exhibits normal muscle tone. Coordination normal.  Skin: Skin is warm and dry. No rash noted.  Psychiatric: She has a normal mood and affect.    ED Course  Procedures (including critical care time)  Labs Reviewed  BASIC METABOLIC PANEL - Abnormal; Notable for the following:    Glucose, Bld 100 (*)    GFR calc non Af Amer 83 (*)    All other components within normal limits  URINALYSIS, ROUTINE W REFLEX MICROSCOPIC  CBC  DIFFERENTIAL  URINE CULTURE   Results for orders placed during the hospital encounter  of 06/21/11  BASIC METABOLIC PANEL      Component Value Range   Sodium 137  135 - 145 (mEq/L)   Potassium 3.7  3.5 - 5.1 (mEq/L)   Chloride 98  96 - 112 (mEq/L)   CO2 29  19 - 32 (mEq/L)   Glucose, Bld 100 (*) 70 - 99 (mg/dL)   BUN 10  6 - 23 (mg/dL)   Creatinine, Ser 1.61  0.50 - 1.10 (mg/dL)   Calcium 9.5  8.4 - 09.6 (mg/dL)   GFR calc non Af Amer 83 (*) >90 (mL/min)   GFR calc Af Amer >90  >90 (mL/min)  URINALYSIS, ROUTINE W REFLEX MICROSCOPIC      Component Value Range   Color, Urine YELLOW  YELLOW    APPearance CLEAR  CLEAR    Specific Gravity, Urine 1.010  1.005 - 1.030    pH 7.5  5.0 - 8.0    Glucose, UA NEGATIVE  NEGATIVE (mg/dL)   Hgb urine dipstick NEGATIVE  NEGATIVE    Bilirubin Urine NEGATIVE  NEGATIVE    Ketones, ur NEGATIVE  NEGATIVE (mg/dL)   Protein, ur NEGATIVE  NEGATIVE (mg/dL)   Urobilinogen, UA 0.2  0.0 - 1.0 (mg/dL)   Nitrite NEGATIVE  NEGATIVE    Leukocytes, UA NEGATIVE  NEGATIVE   CBC      Component Value Range   WBC 9.4  4.0 - 10.5 (K/uL)   RBC 4.47  3.87 - 5.11 (MIL/uL)   Hemoglobin 13.6  12.0 - 15.0 (g/dL)   HCT 04.5  40.9 - 81.1 (%)   MCV 91.1  78.0 - 100.0 (fL)   MCH 30.4  26.0 - 34.0 (pg)   MCHC 33.4  30.0 - 36.0 (g/dL)   RDW 91.4  78.2 - 95.6 (%)   Platelets 190  150 - 400 (K/uL)  DIFFERENTIAL      Component Value Range   Neutrophils Relative 56  43 - 77 (%)   Neutro Abs 5.3  1.7 - 7.7 (K/uL)   Lymphocytes Relative 33  12 - 46 (%)   Lymphs Abs 3.1  0.7 - 4.0 (K/uL)   Monocytes Relative 9  3 - 12 (%)   Monocytes Absolute 0.9  0.1 - 1.0 (K/uL)   Eosinophils Relative 2  0 - 5 (%)   Eosinophils Absolute 0.2  0.0 - 0.7 (K/uL)   Basophils Relative 1  0 - 1 (%)   Basophils Absolute 0.1  0.0 - 0.1 (K/uL)    Ct Head Wo Contrast  06/21/2011  *RADIOLOGY REPORT*  Clinical Data: Confusion.  Altered mental status.  CT HEAD WITHOUT CONTRAST  Technique:  Contiguous axial images were obtained from the base of the skull through the vertex without  contrast.  Comparison: CT of head 04/04/2011.  Findings: No definite acute intracranial abnormalities. Specifically, no definite signs of significant regions of acute/subacute ischemia, no focal mass, mass effect, hydrocephalus or abnormal intra or extra-axial fluid collections.  There is mild cerebral and cerebellar atrophy, with extensive patchy and confluent areas of decreased attenuation throughout the deep and periventricular white matter of the cerebral hemispheres bilaterally, compatible with chronic microvascular ischemic changes.  No acute displaced skull fractures are identified. Visualized paranasal sinuses and mastoids are well pneumatized.  IMPRESSION: 1.  No acute intracranial abnormalities. 2.  Mild cerebral and cerebellar atrophy with chronic microvascular ischemic changes in the deep and periventricular white matter of the cerebral hemispheres bilaterally again noted.  Original Report Authenticated By: Florencia Reasons, M.D.   Dg Chest Portable 1 View  06/21/2011  *RADIOLOGY REPORT*  Clinical Data: Shortness of breath.  PORTABLE CHEST - 1 VIEW  Comparison: 04/04/2011.  Findings: Lung volumes are slightly low.  There are poorly defined opacities in the left base partially obscuring  the left hemidiaphragm that may reflect areas of atelectasis and/or air space consolidation.  There is blunting of the left costophrenic sulcus which could suggest presence of a small left-sided pleural effusion.  Right lung is clear.  Pulmonary vasculature is normal. Heart size is borderline enlarged (unchanged). The patient is rotated to the right on today's exam, resulting in distortion of the mediastinal contours and reduced diagnostic sensitivity and specificity for mediastinal pathology.  Atherosclerotic calcifications within the arch of the aorta.  IMPRESSION: 1.  Low lung volumes with poorly defined left lower lobe opacities that may reflect an area of atelectasis and/or consolidation, with a small left-sided  pleural effusion.  Original Report Authenticated By: Florencia Reasons, M.D.     No diagnosis found. 1. Altered mental status - resolved 2. General malaise    MDM  Patient's husband called EMS for perceived confusion that had resolved prior to EMS arrival and she has remained alert, oriented here, requesting breakfast and eating well. Exam is unremarkable with left sided weakness from previous stroke. No acute abnormalities on CT scan. Doubt acute stroke. Labs normal. Will discharge home to follow up with Dr. Modesto Charon for close follow up this week.        Rodena Medin, PA-C 06/21/11 0932  Rodena Medin, PA-C 07/05/11 1252

## 2011-06-21 NOTE — Discharge Instructions (Signed)
YOU CAN BE DISCHARGED HOME AND SHOULD FOLLOW UP WITH DR. Modesto Charon THIS WEEK FOR RECHECK. RETURN HERE AS NEEDED FOR RECURRENT SYMPTOMS OR NEW CONCERNS. CONTINUE YOUR CURRENT MEDICATIONS AS PRESCRIBED.

## 2011-06-21 NOTE — ED Notes (Signed)
Per EMS:  Pt's husband and pt went to bed around 2300 yesterday. Pt was coherent at this time.  Pt's husband was awakened by pt screaming for her mother, confused, and not having appropriate conversations.  Pt has hx of stroke residual symptoms involving left sided weakness and left arm with limited mobility.  Symptoms resolved once EMS arrived.

## 2011-06-22 LAB — URINE CULTURE: Colony Count: 40000

## 2011-06-29 ENCOUNTER — Emergency Department (HOSPITAL_COMMUNITY): Payer: PRIVATE HEALTH INSURANCE

## 2011-06-29 ENCOUNTER — Emergency Department (HOSPITAL_COMMUNITY)
Admission: EM | Admit: 2011-06-29 | Discharge: 2011-06-29 | Disposition: A | Discharge status: Home / Self Care | Payer: PRIVATE HEALTH INSURANCE | Attending: Emergency Medicine | Admitting: Emergency Medicine

## 2011-06-29 DIAGNOSIS — K219 Gastro-esophageal reflux disease without esophagitis: Secondary | ICD-10-CM | POA: Insufficient documentation

## 2011-06-29 DIAGNOSIS — I10 Essential (primary) hypertension: Secondary | ICD-10-CM | POA: Insufficient documentation

## 2011-06-29 DIAGNOSIS — E669 Obesity, unspecified: Secondary | ICD-10-CM | POA: Insufficient documentation

## 2011-06-29 DIAGNOSIS — R259 Unspecified abnormal involuntary movements: Secondary | ICD-10-CM | POA: Insufficient documentation

## 2011-06-29 DIAGNOSIS — Z794 Long term (current) use of insulin: Secondary | ICD-10-CM | POA: Insufficient documentation

## 2011-06-29 DIAGNOSIS — R29898 Other symptoms and signs involving the musculoskeletal system: Secondary | ICD-10-CM | POA: Insufficient documentation

## 2011-06-29 DIAGNOSIS — Z79899 Other long term (current) drug therapy: Secondary | ICD-10-CM | POA: Insufficient documentation

## 2011-06-29 DIAGNOSIS — K5900 Constipation, unspecified: Secondary | ICD-10-CM | POA: Insufficient documentation

## 2011-06-29 DIAGNOSIS — Z8673 Personal history of transient ischemic attack (TIA), and cerebral infarction without residual deficits: Secondary | ICD-10-CM | POA: Insufficient documentation

## 2011-06-29 DIAGNOSIS — R509 Fever, unspecified: Secondary | ICD-10-CM | POA: Insufficient documentation

## 2011-06-29 DIAGNOSIS — R1013 Epigastric pain: Secondary | ICD-10-CM | POA: Insufficient documentation

## 2011-06-29 DIAGNOSIS — E119 Type 2 diabetes mellitus without complications: Secondary | ICD-10-CM | POA: Insufficient documentation

## 2011-06-29 LAB — CBC
HCT: 40 % (ref 36.0–46.0)
Hemoglobin: 13.8 g/dL (ref 12.0–15.0)
MCV: 88.1 fL (ref 78.0–100.0)
RBC: 4.54 MIL/uL (ref 3.87–5.11)
WBC: 14.1 10*3/uL — ABNORMAL HIGH (ref 4.0–10.5)

## 2011-06-29 LAB — DIFFERENTIAL
Eosinophils Relative: 0 % (ref 0–5)
Lymphocytes Relative: 12 % (ref 12–46)
Lymphs Abs: 1.8 10*3/uL (ref 0.7–4.0)
Monocytes Absolute: 0.7 10*3/uL (ref 0.1–1.0)

## 2011-06-29 LAB — COMPREHENSIVE METABOLIC PANEL
ALT: 47 U/L — ABNORMAL HIGH (ref 0–35)
CO2: 26 mEq/L (ref 19–32)
Calcium: 9.6 mg/dL (ref 8.4–10.5)
Creatinine, Ser: 0.76 mg/dL (ref 0.50–1.10)
GFR calc Af Amer: >90 mL/min (ref >90)
GFR calc non Af Amer: 83 mL/min — ABNORMAL LOW (ref >90)
Glucose, Bld: 216 mg/dL — ABNORMAL HIGH (ref 70–99)
Total Bilirubin: 0.4 mg/dL (ref 0.3–1.2)

## 2011-06-29 LAB — URINALYSIS, ROUTINE W REFLEX MICROSCOPIC
Bilirubin Urine: NEGATIVE
Nitrite: NEGATIVE
Protein, ur: NEGATIVE mg/dL
Specific Gravity, Urine: 1.012 (ref 1.005–1.030)
Urobilinogen, UA: 0.2 mg/dL (ref 0.0–1.0)

## 2011-06-29 MED ORDER — SODIUM CHLORIDE 0.9 % IV SOLN
1000.0000 mL | INTRAVENOUS | Status: DC
  Administered 2011-06-29: 1000 mL via INTRAVENOUS

## 2011-06-29 MED ORDER — ONDANSETRON HCL 4 MG/2ML IJ SOLN
4.0000 mg | Freq: Once | INTRAMUSCULAR | Status: AC
  Administered 2011-06-29: 4 mg via INTRAVENOUS
  Filled 2011-06-29: qty 2

## 2011-06-29 MED ORDER — LACTULOSE 10 GM/15ML PO SOLN: 20.0000 g | Freq: Two times a day (BID) | ORAL | Status: AC | PRN

## 2011-06-29 NOTE — ED Notes (Signed)
Bed:WA19<BR> Expected date:<BR> Expected time:<BR> Means of arrival:<BR> Comments:<BR> ems

## 2011-06-29 NOTE — ED Notes (Signed)
Pt's husband sts her blood sugar was 299 and he gave 5 units of novolog at that time.

## 2011-06-29 NOTE — Discharge Instructions (Signed)
Stay on a simple diet for one or 2 days until you start feeling better. Drink plenty of water each day. See your Dr. if not better in 2 days  Abdominal Pain Abdominal pain can be caused by many things. Your caregiver decides the seriousness of your pain by an examination and possibly blood tests and X-rays. Many cases can be observed and treated at home. Most abdominal pain is not caused by a disease and will probably improve without treatment. However, in many cases, more time must pass before a clear cause of the pain can be found. Before that point, it may not be known if you need more testing, or if hospitalization or surgery is needed. HOME CARE INSTRUCTIONS   Do not take laxatives unless directed by your caregiver.   Take pain medicine only as directed by your caregiver.   Only take over-the-counter or prescription medicines for pain, discomfort, or fever as directed by your caregiver.   Try a clear liquid diet (broth, tea, or water) for as long as directed by your caregiver. Slowly move to a bland diet as tolerated.  SEEK IMMEDIATE MEDICAL CARE IF:   The pain does not go away.   You have a fever.   You keep throwing up (vomiting).   The pain is felt only in portions of the abdomen. Pain in the right side could possibly be appendicitis. In an adult, pain in the left lower portion of the abdomen could be colitis or diverticulitis.   You pass bloody or black tarry stools.  MAKE SURE YOU:   Understand these instructions.   Will watch your condition.   Will get help right away if you are not doing well or get worse.  Document Released: 11/18/2004 Document Revised: 01/28/2011 Document Reviewed: 09/27/2007 [facility] Patient Information 2012 [place], [place].Constipation in Adults Constipation is having fewer than 2 bowel movements per week. Usually, the stools are hard. As we grow older, constipation is more common. If you try to fix constipation with laxatives, the problem may get  worse. This is because laxatives taken over a long period of time make the colon muscles weaker. A low-fiber diet, not taking in enough fluids, and taking some medicines may make these problems worse. MEDICATIONS THAT MAY CAUSE CONSTIPATION  Water pills (diuretics).   Calcium channel blockers (used to control blood pressure and for the heart).   Certain pain medicines (narcotics).   Anticholinergics.   Anti-inflammatory agents.   Antacids that contain aluminum.  DISEASES THAT CONTRIBUTE TO CONSTIPATION  Diabetes.   Parkinson's disease.   Dementia.   Stroke.   Depression.   Illnesses that cause problems with salt and water metabolism.  HOME CARE INSTRUCTIONS   Constipation is usually best cared for without medicines. Increasing dietary fiber and eating more fruits and vegetables is the best way to manage constipation.   Slowly increase fiber intake to 25 to 38 grams per day. Whole grains, fruits, vegetables, and legumes are good sources of fiber. A dietitian can further help you incorporate high-fiber foods into your diet.   Drink enough water and fluids to keep your urine clear or pale yellow.   A fiber supplement may be added to your diet if you cannot get enough fiber from foods.   Increasing your activities also helps improve regularity.   Suppositories, as suggested by your caregiver, will also help. If you are using antacids, such as aluminum or calcium containing products, it will be helpful to switch to products containing magnesium if  your caregiver says it is okay.   If you have been given a liquid injection (enema) today, this is only a temporary measure. It should not be relied on for treatment of longstanding (chronic) constipation.   Stronger measures, such as magnesium sulfate, should be avoided if possible. This may cause uncontrollable diarrhea. Using magnesium sulfate may not allow you time to make it to the bathroom.  SEEK IMMEDIATE MEDICAL CARE IF:    There is bright red blood in the stool.   The constipation stays for more than 4 days.   There is belly (abdominal) Dr. to or rectal pain.   You do not seem to be getting better.   You have any questions or concerns.  MAKE SURE YOU:   Understand these instructions.   Will watch your condition.   Will get help right away if you are not doing well or get worse.  Document Released: 11/07/2003 Document Revised: 01/28/2011 Document Reviewed: 01/12/2011 [facility] Patient Information 2012 [place], [place].

## 2011-06-29 NOTE — ED Provider Notes (Signed)
History     CSN: [id number]  Arrival date & time 06/29/11  4098   First MD Initiated Contact with Patient 06/29/11 [phone]      Chief Complaint  Patient presents with  . Hyperglycemia    had cortisone injection yesterday  . Fever  . Abdominal Pain    mid umbilical area    (Consider location/radiation/quality/duration/timing/severity/associated sxs/prior treatment) HPI Comments: Sarah Skinner is a 71 y.o. female who awoke this morning, tried to have a bowel movement, and was unable to do. She's been using MiraLAX twice a day for constipation. She also had trouble voiding at home, this morning, but was able to void in a bedpan here. She was nauseated this morning, but did not vomit. She has no recent fever, anorexia, headache, chest pain, shortness of breath, back pain, unusual weakness or dizziness. She was evaluated in the emergency department 9 days ago for altered mental status. Her husband reports that her glucose was elevated at 299, last night. So at 4 AM today, he gave her 5 units of NovoLog. Patient reports mild, abdominal pain that started this morning.  Patient is a 71 y.o. female presenting with fever and abdominal pain. The history is provided by the patient.  Fever Primary symptoms of the febrile illness include fever and abdominal pain.  Abdominal Pain The primary symptoms of the illness include abdominal pain and fever.    Past Medical History  Diagnosis Date  . Hypertension   . Diabetes mellitus   . Hypothyroidism   . Blood transfusion     hx of with childbirth   . Chronic kidney disease     stress incontinence   . GERD (gastroesophageal reflux disease)   . Headache     hx of   . Fibromyalgia   . Anxiety   . Arthritis     hands, feet , knees   . Stroke     left sided weakness     Past Surgical History  Procedure Date  . Abdominal hysterectomy   . Cholecystectomy   . Rectocele repair   . Cervical fusion   . Tubal ligation   . Wisdom tooth extraction    . Cystoscopy with injection 05/18/2011    Procedure: CYSTOSCOPY WITH INJECTION;  Surgeon: Anner Crete, MD;  Location: [facility];  Service: Urology;  Laterality: N/A;  Injection of Macroplastique    No family history on file.  History  Substance Use Topics  . Smoking status: Former Smoker    Quit date: 02/23/1991  . Smokeless tobacco: Never Used   Comment: Quite 20 Yrs Ago  . Alcohol Use: No     hx of no longer uses     OB History    Grav Para Term Preterm Abortions TAB SAB Ect Mult Living                  Review of Systems  Constitutional: Positive for fever.  Gastrointestinal: Positive for abdominal pain.  All other systems reviewed and are negative.    Allergies  Aspirin; Penicillins; Sulfa antibiotics; and Latex  Home Medications   Current Outpatient Rx  Name Route Sig Dispense Refill  . ALPRAZOLAM 0.5 MG PO TABS Oral Take 0.5 mg by mouth 3 (three) times daily.     Marland Kitchen DICLOFENAC SODIUM 50 MG PO TBEC Oral Take 50 mg by mouth 2 (two) times daily.     Marland Kitchen DOCUSATE SODIUM 100 MG PO CAPS Oral Take 200 mg by mouth 2 (two)  times daily.     Marland Kitchen ESCITALOPRAM OXALATE 20 MG PO TABS Oral Take 20 mg by mouth every morning.     . INSULIN ASPART 100 UNIT/ML [place] SOLN Subcutaneous Inject 3 Units into the skin 3 (three) times daily before meals. Injects 3 units every morning, dinner (noon), and supper.    . INSULIN GLARGINE 100 UNIT/ML [place] SOLN Subcutaneous Inject 31 Units into the skin at bedtime.     Marland Kitchen LEVOTHYROXINE SODIUM 50 MCG PO TABS Oral Take 50 mcg by mouth every morning.     Marland Kitchen LORATADINE 10 MG PO TABS Oral Take 10 mg by mouth daily.     Marland Kitchen METOPROLOL TARTRATE 50 MG PO TABS Oral Take 50 mg by mouth 2 (two) times daily.     Marland Kitchen ONE-DAILY MULTI VITAMINS PO TABS Oral Take 1 tablet by mouth daily with lunch.     . MULTIVITAMIN & MINERAL PO Oral Take 1 tablet by mouth daily. Multivitamin with calcium 1000mg , magnesium 400mg  and zinc 15 mg per daughter. Patient takes at nite    . FISH OIL  1000 MG PO CAPS Oral Take 1 capsule by mouth 4 (four) times daily.     Marland Kitchen PANTOPRAZOLE SODIUM 40 MG PO TBEC Oral Take 40 mg by mouth every morning.     Marland Kitchen MIRALAX PO Oral Take 17 g by mouth every morning.     Marland Kitchen POTASSIUM CHLORIDE CRYS ER 20 MEQ PO TBCR Oral Take 20 mEq by mouth daily with lunch.     . ROSUVASTATIN CALCIUM 10 MG PO TABS Oral Take 10 mg by mouth. Pt takes every other nite    . SOLIFENACIN SUCCINATE 5 MG PO TABS Oral Take 5 mg by mouth every morning.     Marland Kitchen VITAMIN C 500 MG PO TABS Oral Take 500 mg by mouth every morning.     Marland Kitchen VITAMIN E 400 UNITS PO CAPS Oral Take 400 Units by mouth every morning.     Marland Kitchen ZOLPIDEM TARTRATE 5 MG PO TABS Oral Take 5 mg by mouth at bedtime.     Marland Kitchen LACTULOSE 10 GM/15ML PO SOLN Oral Take 30 mLs (20 g total) by mouth 2 (two) times daily as needed (constipation). 500 mL 0    BP 179/86  Pulse 95  Temp(Src) 98.7 F (37.1 C) (Oral)  Resp 20  SpO2 95%  Physical Exam  Nursing note and vitals reviewed. Constitutional: She is oriented to person, place, and time. She appears well-developed.       She is obese  HENT:  Head: Normocephalic and atraumatic.  Eyes: Conjunctivae and EOM are normal. Pupils are equal, round, and reactive to light.  Neck: Normal range of motion and phonation normal. Neck supple.  Cardiovascular: Normal rate, regular rhythm and intact distal pulses.   Pulmonary/Chest: Effort normal and breath sounds normal. No respiratory distress. She has no wheezes. She has no rales. She exhibits no tenderness.  Abdominal: Soft. She exhibits no distension and no mass. There is tenderness (Mild epigastric). There is no guarding.  Musculoskeletal: Normal range of motion.  Neurological: She is alert and oriented to person, place, and time. She has normal strength.       Left arm weakness and clonus, consistent with prior stroke. Left leg 4/5 strength as compared to the right leg with 5 out of 5 strength.  Skin: Skin is warm and dry.  Psychiatric: She  has a normal mood and affect. Her behavior is normal. Judgment and thought content normal.  ED Course  Procedures (including critical care time)  Labs Reviewed  CBC - Abnormal; Notable for the following:    WBC 14.1 (*)    All other components within normal limits  DIFFERENTIAL - Abnormal; Notable for the following:    Neutrophils Relative 83 (*)    Neutro Abs 11.6 (*)    All other components within normal limits  COMPREHENSIVE METABOLIC PANEL - Abnormal; Notable for the following:    Sodium 134 (*)    Glucose, Bld 216 (*)    ALT 47 (*)    GFR calc non Af Amer 83 (*)    All other components within normal limits  URINALYSIS, ROUTINE W REFLEX MICROSCOPIC - Abnormal; Notable for the following:    APPearance CLOUDY (*)    All other components within normal limits  URINE CULTURE   Dg Abd Acute W/chest  06/29/2011  *RADIOLOGY REPORT*  Clinical Data: Hyperglycemia, fever, abdominal pain  ACUTE ABDOMEN SERIES (ABDOMEN 2 VIEW & CHEST 1 VIEW)  Comparison: Chest radiograph 06/21/2011, abdominal radiographs 12/19/2010  Findings: Borderline enlargement of cardiac silhouette. Slightly prominent right superior mediastinal soft tissues unchanged. Atherosclerotic calcification of thoracic and abdominal aorta. Pulmonary vascular congestion. No definite infiltrate or effusion. Bones appear demineralized. Nonobstructive bowel gas pattern. Gas and stool present rectum. No bowel dilatation, bowel wall thickening or intraperitoneal air. No definite urinary tract calcification. Surgical clips right upper quadrant question cholecystectomy.  IMPRESSION: No acute abdominal findings.  Original Report Authenticated By: Lollie Marrow, M.D.   Reeval. At d/c, pt is comfortable, eating and drinking well. No further c/o  1. Abdominal pain   2. Constipation       MDM  Nonspecific abdominal pain with negative ED eval. Doubt colitis, UTI, metabolic instability. Pt is stable for d/c    Plan: Home Medications-  Lactulose; Home Treatments- increase fluids; Recommended follow up- PCP in 1 week        Flint Melter, MD 06/29/11 [phone]

## 2011-06-29 NOTE — ED Notes (Signed)
Pt is from home (hx of stroke) reports constipation, abdominal pain, fever and high blood sugar

## 2011-06-29 NOTE — ED Notes (Signed)
Diaper put on pt and pt moved to hallway. Awaiting transport home.

## 2011-06-30 LAB — URINE CULTURE

## 2011-07-06 NOTE — ED Provider Notes (Signed)
Medical screening examination/treatment/procedure(s) were conducted as a shared visit with non-physician practitioner(s) and myself.  I personally evaluated the patient during the encounter  Please see my separate respective documentation pertaining to this patient encounter   Vida Roller, MD 07/06/11 1425

## 2011-08-16 ENCOUNTER — Inpatient Hospital Stay (HOSPITAL_COMMUNITY)
Admission: EM | Admit: 2011-08-16 | Discharge: 2011-08-20 | DRG: 394 | Disposition: A | Discharge status: Home Health | Payer: PRIVATE HEALTH INSURANCE | Attending: Internal Medicine | Admitting: Internal Medicine

## 2011-08-16 ENCOUNTER — Encounter (HOSPITAL_COMMUNITY): Payer: Self-pay | Admitting: *Deleted

## 2011-08-16 ENCOUNTER — Emergency Department (HOSPITAL_COMMUNITY): Payer: PRIVATE HEALTH INSURANCE

## 2011-08-16 DIAGNOSIS — N393 Stress incontinence (female) (male): Secondary | ICD-10-CM | POA: Diagnosis present

## 2011-08-16 DIAGNOSIS — I639 Cerebral infarction, unspecified: Secondary | ICD-10-CM

## 2011-08-16 DIAGNOSIS — E119 Type 2 diabetes mellitus without complications: Secondary | ICD-10-CM | POA: Diagnosis present

## 2011-08-16 DIAGNOSIS — R109 Unspecified abdominal pain: Secondary | ICD-10-CM

## 2011-08-16 DIAGNOSIS — E871 Hypo-osmolality and hyponatremia: Secondary | ICD-10-CM | POA: Diagnosis present

## 2011-08-16 DIAGNOSIS — E039 Hypothyroidism, unspecified: Secondary | ICD-10-CM | POA: Diagnosis present

## 2011-08-16 DIAGNOSIS — I69959 Hemiplegia and hemiparesis following unspecified cerebrovascular disease affecting unspecified side: Secondary | ICD-10-CM

## 2011-08-16 DIAGNOSIS — K219 Gastro-esophageal reflux disease without esophagitis: Secondary | ICD-10-CM

## 2011-08-16 DIAGNOSIS — F3289 Other specified depressive episodes: Secondary | ICD-10-CM | POA: Diagnosis present

## 2011-08-16 DIAGNOSIS — F418 Other specified anxiety disorders: Secondary | ICD-10-CM

## 2011-08-16 DIAGNOSIS — F411 Generalized anxiety disorder: Secondary | ICD-10-CM | POA: Diagnosis present

## 2011-08-16 DIAGNOSIS — D72829 Elevated white blood cell count, unspecified: Secondary | ICD-10-CM | POA: Diagnosis present

## 2011-08-16 DIAGNOSIS — K559 Vascular disorder of intestine, unspecified: Principal | ICD-10-CM | POA: Diagnosis present

## 2011-08-16 DIAGNOSIS — R112 Nausea with vomiting, unspecified: Secondary | ICD-10-CM | POA: Diagnosis present

## 2011-08-16 DIAGNOSIS — K760 Fatty (change of) liver, not elsewhere classified: Secondary | ICD-10-CM

## 2011-08-16 DIAGNOSIS — IMO0001 Reserved for inherently not codable concepts without codable children: Secondary | ICD-10-CM | POA: Diagnosis present

## 2011-08-16 DIAGNOSIS — M797 Fibromyalgia: Secondary | ICD-10-CM | POA: Diagnosis present

## 2011-08-16 DIAGNOSIS — K625 Hemorrhage of anus and rectum: Secondary | ICD-10-CM | POA: Diagnosis present

## 2011-08-16 DIAGNOSIS — E876 Hypokalemia: Secondary | ICD-10-CM | POA: Diagnosis present

## 2011-08-16 DIAGNOSIS — K5792 Diverticulitis of intestine, part unspecified, without perforation or abscess without bleeding: Secondary | ICD-10-CM

## 2011-08-16 DIAGNOSIS — I10 Essential (primary) hypertension: Secondary | ICD-10-CM | POA: Diagnosis present

## 2011-08-16 DIAGNOSIS — F329 Major depressive disorder, single episode, unspecified: Secondary | ICD-10-CM | POA: Diagnosis present

## 2011-08-16 DIAGNOSIS — K7689 Other specified diseases of liver: Secondary | ICD-10-CM | POA: Diagnosis present

## 2011-08-16 DIAGNOSIS — K5900 Constipation, unspecified: Secondary | ICD-10-CM | POA: Diagnosis present

## 2011-08-16 DIAGNOSIS — K5909 Other constipation: Secondary | ICD-10-CM | POA: Diagnosis present

## 2011-08-16 DIAGNOSIS — K6289 Other specified diseases of anus and rectum: Secondary | ICD-10-CM | POA: Diagnosis present

## 2011-08-16 HISTORY — DX: Angina pectoris, unspecified: I20.9

## 2011-08-16 HISTORY — DX: Stress incontinence (female) (male): N39.3

## 2011-08-16 LAB — DIFFERENTIAL
Lymphocytes Relative: 9 % — ABNORMAL LOW (ref 12–46)
Lymphs Abs: 2 10*3/uL (ref 0.7–4.0)
Monocytes Relative: 6 % (ref 3–12)
Neutro Abs: 19.1 10*3/uL — ABNORMAL HIGH (ref 1.7–7.7)
Neutrophils Relative %: 85 % — ABNORMAL HIGH (ref 43–77)

## 2011-08-16 LAB — URINE MICROSCOPIC-ADD ON

## 2011-08-16 LAB — URINALYSIS, ROUTINE W REFLEX MICROSCOPIC
Bilirubin Urine: NEGATIVE
Leukocytes, UA: NEGATIVE
Nitrite: NEGATIVE
Specific Gravity, Urine: 1.016 (ref 1.005–1.030)
Urobilinogen, UA: 0.2 mg/dL (ref 0.0–1.0)
pH: 7.5 (ref 5.0–8.0)

## 2011-08-16 LAB — CBC
Hemoglobin: 14.6 g/dL (ref 12.0–15.0)
Platelets: 223 10*3/uL (ref 150–400)
RBC: 4.74 MIL/uL (ref 3.87–5.11)
WBC: 22.5 10*3/uL — ABNORMAL HIGH (ref 4.0–10.5)

## 2011-08-16 NOTE — ED Notes (Signed)
Per EMS: patient c/o abdominal pain. Has been constipated x 4 days, took enema at home and has had diarrhea x 3. Patient has also had N/V 1 time today. Patient last ate at breakfast, hx diabetes. 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 Pt also sts that she thought she saw blood when she used the restroom earlier, not sure if it was urine or feces.

## 2011-08-16 NOTE — ED Notes (Signed)
Patient calling out requesting to see MD

## 2011-08-16 NOTE — ED Notes (Signed)
Patient requesting water and pain medication, informed patient that she is not to eat or drink anything until MD sees her and that no pain medication can be given until MD sees her.

## 2011-08-16 NOTE — ED Notes (Signed)
Bright red blood and mucous found in bedpan after patient was taken off- In and out cath done on patient. Patient tolerated well. Patient informed that hemoccult with most likely be done next, procedure explained to patient.

## 2011-08-16 NOTE — ED Notes (Signed)
YQM:VH84<ON> Expected date:08/16/11<BR> Expected time: 9:15 PM<BR> Means of arrival:Ambulance<BR> Comments:<BR> abd pain

## 2011-08-17 ENCOUNTER — Encounter (HOSPITAL_COMMUNITY): Payer: Self-pay | Admitting: Internal Medicine

## 2011-08-17 ENCOUNTER — Emergency Department (HOSPITAL_COMMUNITY): Payer: PRIVATE HEALTH INSURANCE

## 2011-08-17 DIAGNOSIS — D72829 Elevated white blood cell count, unspecified: Secondary | ICD-10-CM | POA: Diagnosis present

## 2011-08-17 DIAGNOSIS — K5909 Other constipation: Secondary | ICD-10-CM | POA: Diagnosis present

## 2011-08-17 DIAGNOSIS — K6289 Other specified diseases of anus and rectum: Secondary | ICD-10-CM | POA: Diagnosis present

## 2011-08-17 DIAGNOSIS — E039 Hypothyroidism, unspecified: Secondary | ICD-10-CM | POA: Diagnosis present

## 2011-08-17 DIAGNOSIS — N393 Stress incontinence (female) (male): Secondary | ICD-10-CM | POA: Diagnosis present

## 2011-08-17 DIAGNOSIS — E1165 Type 2 diabetes mellitus with hyperglycemia: Secondary | ICD-10-CM

## 2011-08-17 DIAGNOSIS — K219 Gastro-esophageal reflux disease without esophagitis: Secondary | ICD-10-CM

## 2011-08-17 DIAGNOSIS — E876 Hypokalemia: Secondary | ICD-10-CM | POA: Diagnosis present

## 2011-08-17 DIAGNOSIS — D7289 Other specified disorders of white blood cells: Secondary | ICD-10-CM

## 2011-08-17 DIAGNOSIS — K760 Fatty (change of) liver, not elsewhere classified: Secondary | ICD-10-CM | POA: Diagnosis present

## 2011-08-17 DIAGNOSIS — E871 Hypo-osmolality and hyponatremia: Secondary | ICD-10-CM | POA: Diagnosis present

## 2011-08-17 DIAGNOSIS — K625 Hemorrhage of anus and rectum: Secondary | ICD-10-CM

## 2011-08-17 DIAGNOSIS — IMO0001 Reserved for inherently not codable concepts without codable children: Secondary | ICD-10-CM

## 2011-08-17 LAB — HEPATIC FUNCTION PANEL
Alkaline Phosphatase: 74 U/L (ref 39–117)
Bilirubin, Direct: 0.2 mg/dL (ref 0.0–0.3)
Indirect Bilirubin: 0.5 mg/dL (ref 0.3–0.9)
Total Bilirubin: 0.7 mg/dL (ref 0.3–1.2)

## 2011-08-17 LAB — TSH: TSH: 1.318 u[IU]/mL (ref 0.350–4.500)

## 2011-08-17 LAB — BASIC METABOLIC PANEL
CO2: 26 mEq/L (ref 19–32)
Chloride: 90 mEq/L — ABNORMAL LOW (ref 96–112)
Glucose, Bld: 215 mg/dL — ABNORMAL HIGH (ref 70–99)
Potassium: 3.2 mEq/L — ABNORMAL LOW (ref 3.5–5.1)
Sodium: 130 mEq/L — ABNORMAL LOW (ref 135–145)

## 2011-08-17 LAB — HEMOGLOBIN A1C: Hgb A1c MFr Bld: 6.8 % — ABNORMAL HIGH (ref <5.7)

## 2011-08-17 LAB — PROTIME-INR: INR: 0.98 (ref 0.00–1.49)

## 2011-08-17 LAB — URINE CULTURE
Colony Count: NO GROWTH
Culture: NO GROWTH

## 2011-08-17 LAB — APTT: aPTT: 30 seconds (ref 24–37)

## 2011-08-17 LAB — OCCULT BLOOD, POC DEVICE: Fecal Occult Bld: POSITIVE

## 2011-08-17 LAB — GLUCOSE, CAPILLARY: Glucose-Capillary: 200 mg/dL — ABNORMAL HIGH (ref 70–99)

## 2011-08-17 MED ORDER — METRONIDAZOLE IN NACL 5-0.79 MG/ML-% IV SOLN
500.0000 mg | Freq: Once | INTRAVENOUS | Status: AC
  Administered 2011-08-17: 500 mg via INTRAVENOUS
  Filled 2011-08-17: qty 100

## 2011-08-17 MED ORDER — OMEGA-3-ACID ETHYL ESTERS 1 G PO CAPS
1.0000 g | ORAL_CAPSULE | Freq: Four times a day (QID) | ORAL | Status: DC
  Administered 2011-08-17 – 2011-08-20 (×11): 1 g via ORAL
  Filled 2011-08-17 (×16): qty 1

## 2011-08-17 MED ORDER — ONDANSETRON HCL 4 MG PO TABS: 4.0000 mg | ORAL_TABLET | Freq: Four times a day (QID) | ORAL | Status: DC | PRN

## 2011-08-17 MED ORDER — ZOLPIDEM TARTRATE 5 MG PO TABS: 5.0000 mg | ORAL_TABLET | Freq: Every evening | ORAL | Status: DC | PRN

## 2011-08-17 MED ORDER — POTASSIUM CHLORIDE CRYS ER 20 MEQ PO TBCR
20.0000 meq | EXTENDED_RELEASE_TABLET | Freq: Every day | ORAL | Status: DC
  Administered 2011-08-19 – 2011-08-20 (×2): 20 meq via ORAL
  Filled 2011-08-17 (×4): qty 1

## 2011-08-17 MED ORDER — ONDANSETRON HCL 4 MG/2ML IJ SOLN
4.0000 mg | Freq: Four times a day (QID) | INTRAMUSCULAR | Status: DC | PRN
  Administered 2011-08-17 – 2011-08-18 (×2): 4 mg via INTRAVENOUS
  Filled 2011-08-17 (×2): qty 2

## 2011-08-17 MED ORDER — CIPROFLOXACIN IN D5W 400 MG/200ML IV SOLN
400.0000 mg | Freq: Two times a day (BID) | INTRAVENOUS | Status: DC
  Administered 2011-08-17 – 2011-08-20 (×7): 400 mg via INTRAVENOUS
  Filled 2011-08-17 (×8): qty 200

## 2011-08-17 MED ORDER — DARIFENACIN HYDROBROMIDE ER 7.5 MG PO TB24
7.5000 mg | ORAL_TABLET | Freq: Every day | ORAL | Status: DC
  Administered 2011-08-17 – 2011-08-20 (×3): 7.5 mg via ORAL
  Filled 2011-08-17 (×5): qty 1

## 2011-08-17 MED ORDER — ONE-DAILY MULTI VITAMINS PO TABS: 1.0000 | ORAL_TABLET | Freq: Every day | ORAL | Status: DC

## 2011-08-17 MED ORDER — ALUM & MAG HYDROXIDE-SIMETH 200-200-20 MG/5ML PO SUSP: 30.0000 mL | Freq: Four times a day (QID) | ORAL | Status: DC | PRN

## 2011-08-17 MED ORDER — ESCITALOPRAM OXALATE 20 MG PO TABS
20.0000 mg | ORAL_TABLET | Freq: Every day | ORAL | Status: DC
  Administered 2011-08-17 – 2011-08-20 (×3): 20 mg via ORAL
  Filled 2011-08-17 (×2): qty 1
  Filled 2011-08-17 (×2): qty 2

## 2011-08-17 MED ORDER — ACETAMINOPHEN 325 MG PO TABS
650.0000 mg | ORAL_TABLET | Freq: Four times a day (QID) | ORAL | Status: DC | PRN
  Administered 2011-08-18: 650 mg via ORAL
  Filled 2011-08-17: qty 2

## 2011-08-17 MED ORDER — SODIUM CHLORIDE 0.9 % IV SOLN
Freq: Once | INTRAVENOUS | Status: AC
  Administered 2011-08-17: 07:00:00 via INTRAVENOUS

## 2011-08-17 MED ORDER — POTASSIUM CHLORIDE IN NACL 40-0.9 MEQ/L-% IV SOLN
INTRAVENOUS | Status: DC
  Administered 2011-08-17 – 2011-08-18 (×2): via INTRAVENOUS
  Filled 2011-08-17 (×4): qty 1000

## 2011-08-17 MED ORDER — ATORVASTATIN CALCIUM 20 MG PO TABS
20.0000 mg | ORAL_TABLET | Freq: Every day | ORAL | Status: DC
  Administered 2011-08-17 – 2011-08-19 (×2): 20 mg via ORAL
  Filled 2011-08-17 (×4): qty 1

## 2011-08-17 MED ORDER — LEVOTHYROXINE SODIUM 50 MCG PO TABS
50.0000 ug | ORAL_TABLET | Freq: Every day | ORAL | Status: DC
  Administered 2011-08-17 – 2011-08-20 (×4): 50 ug via ORAL
  Filled 2011-08-17 (×5): qty 1

## 2011-08-17 MED ORDER — PANTOPRAZOLE SODIUM 40 MG PO TBEC
40.0000 mg | DELAYED_RELEASE_TABLET | Freq: Every day | ORAL | Status: DC
  Administered 2011-08-19: 40 mg via ORAL
  Filled 2011-08-17 (×3): qty 1

## 2011-08-17 MED ORDER — ADULT MULTIVITAMIN W/MINERALS CH
1.0000 | ORAL_TABLET | Freq: Every day | ORAL | Status: DC
  Administered 2011-08-19 – 2011-08-20 (×2): 1 via ORAL
  Filled 2011-08-17 (×4): qty 1

## 2011-08-17 MED ORDER — DOCUSATE SODIUM 100 MG PO CAPS
200.0000 mg | ORAL_CAPSULE | Freq: Two times a day (BID) | ORAL | Status: DC
  Administered 2011-08-17 – 2011-08-20 (×6): 200 mg via ORAL
  Filled 2011-08-17 (×8): qty 2

## 2011-08-17 MED ORDER — METOPROLOL TARTRATE 50 MG PO TABS
50.0000 mg | ORAL_TABLET | Freq: Two times a day (BID) | ORAL | Status: DC
  Administered 2011-08-17 – 2011-08-20 (×6): 50 mg via ORAL
  Filled 2011-08-17 (×2): qty 1
  Filled 2011-08-17 (×2): qty 2
  Filled 2011-08-17: qty 1
  Filled 2011-08-17 (×2): qty 2
  Filled 2011-08-17: qty 1

## 2011-08-17 MED ORDER — MORPHINE SULFATE 2 MG/ML IJ SOLN
2.0000 mg | INTRAMUSCULAR | Status: DC | PRN
Start: 2011-08-17 — End: 2011-08-20
  Filled 2011-08-17: qty 1

## 2011-08-17 MED ORDER — METRONIDAZOLE IN NACL 5-0.79 MG/ML-% IV SOLN
500.0000 mg | Freq: Three times a day (TID) | INTRAVENOUS | Status: DC
  Filled 2011-08-17 (×3): qty 100

## 2011-08-17 MED ORDER — INSULIN ASPART 100 UNIT/ML ~~LOC~~ SOLN
0.0000 [IU] | Freq: Three times a day (TID) | SUBCUTANEOUS | Status: DC
  Administered 2011-08-17 (×2): 2 [IU] via SUBCUTANEOUS
  Administered 2011-08-18 – 2011-08-20 (×4): 1 [IU] via SUBCUTANEOUS
  Administered 2011-08-20: 2 [IU] via SUBCUTANEOUS

## 2011-08-17 MED ORDER — INSULIN GLARGINE 100 UNIT/ML ~~LOC~~ SOLN
31.0000 [IU] | Freq: Every day | SUBCUTANEOUS | Status: DC
  Administered 2011-08-17 – 2011-08-20 (×3): 31 [IU] via SUBCUTANEOUS

## 2011-08-17 MED ORDER — INSULIN ASPART 100 UNIT/ML ~~LOC~~ SOLN
3.0000 [IU] | Freq: Three times a day (TID) | SUBCUTANEOUS | Status: DC
  Administered 2011-08-17 – 2011-08-20 (×7): 3 [IU] via SUBCUTANEOUS

## 2011-08-17 MED ORDER — ACETAMINOPHEN 650 MG RE SUPP: 650.0000 mg | Freq: Four times a day (QID) | RECTAL | Status: DC | PRN

## 2011-08-17 MED ORDER — HYDROCODONE-ACETAMINOPHEN 5-325 MG PO TABS
1.0000 | ORAL_TABLET | ORAL | Status: DC | PRN
  Administered 2011-08-19: 1 via ORAL
  Filled 2011-08-17: qty 1

## 2011-08-17 MED ORDER — ALPRAZOLAM 0.5 MG PO TABS
0.5000 mg | ORAL_TABLET | Freq: Three times a day (TID) | ORAL | Status: DC
  Administered 2011-08-17 – 2011-08-20 (×8): 0.5 mg via ORAL
  Filled 2011-08-17 (×9): qty 1

## 2011-08-17 MED ORDER — INSULIN ASPART 100 UNIT/ML ~~LOC~~ SOLN: 0.0000 [IU] | Freq: Every day | SUBCUTANEOUS | Status: DC

## 2011-08-17 MED ORDER — METRONIDAZOLE IN NACL 5-0.79 MG/ML-% IV SOLN
500.0000 mg | Freq: Three times a day (TID) | INTRAVENOUS | Status: DC
  Administered 2011-08-17 – 2011-08-20 (×9): 500 mg via INTRAVENOUS
  Filled 2011-08-17 (×11): qty 100

## 2011-08-17 MED ORDER — POLYETHYLENE GLYCOL 3350 17 G PO PACK
17.0000 g | PACK | Freq: Two times a day (BID) | ORAL | Status: DC
  Administered 2011-08-17 – 2011-08-20 (×6): 17 g via ORAL
  Filled 2011-08-17 (×8): qty 1

## 2011-08-17 MED ORDER — CIPROFLOXACIN IN D5W 400 MG/200ML IV SOLN
400.0000 mg | Freq: Once | INTRAVENOUS | Status: AC
  Administered 2011-08-17: 400 mg via INTRAVENOUS
  Filled 2011-08-17: qty 200

## 2011-08-17 MED ORDER — LORATADINE 10 MG PO TABS
10.0000 mg | ORAL_TABLET | Freq: Every day | ORAL | Status: DC
  Administered 2011-08-19 – 2011-08-20 (×2): 10 mg via ORAL
  Filled 2011-08-17 (×4): qty 1

## 2011-08-17 MED ORDER — IOHEXOL 300 MG/ML  SOLN
100.0000 mL | Freq: Once | INTRAMUSCULAR | Status: AC | PRN
  Administered 2011-08-17: 100 mL via INTRAVENOUS

## 2011-08-17 MED ORDER — FLEET ENEMA 7-19 GM/118ML RE ENEM
2.0000 | ENEMA | Freq: Once | RECTAL | Status: AC
  Administered 2011-08-18: 2 via RECTAL
  Filled 2011-08-17: qty 1

## 2011-08-17 NOTE — ED Provider Notes (Signed)
History     CSN: [id number]  Arrival date & time 08/16/11  2115   First MD Initiated Contact with Patient 08/17/11 0018      Chief Complaint  Patient presents with  . Abdominal Pain    (Consider location/radiation/quality/duration/timing/severity/associated sxs/prior treatment) HPI Comments: Pateint with Hx constipation used an enema today had a large hard BM than has has diarrhea since with blood in the last 3 episodes and diffuse abdominal pain   Patient is a 71 y.o. female presenting with abdominal pain. The history is provided by the patient and the spouse.  Abdominal Pain The primary symptoms of the illness include abdominal pain, nausea, vomiting, diarrhea and hematochezia. The primary symptoms of the illness do not include fever, shortness of breath or dysuria. The current episode started 3 to 5 hours ago.  The abdominal pain began 6 to 12 hours ago. The pain came on gradually. The abdominal pain has been unchanged since its onset. The abdominal pain is generalized. The severity of the abdominal pain is 5/10. The abdominal pain is relieved by nothing.  Nausea began today.  The diarrhea began today. The diarrhea is blood-tinged. The diarrhea occurs 2 to 4 times per day.  The patient states that she believes she is currently not pregnant. The patient has had a change in bowel habit. Additional symptoms associated with the illness include constipation.    Past Medical History  Diagnosis Date  . Hypertension   . Diabetes mellitus   . Hypothyroidism   . Blood transfusion     hx of with childbirth   . Chronic kidney disease     stress incontinence   . GERD (gastroesophageal reflux disease)   . Headache     hx of   . Fibromyalgia   . Anxiety   . Arthritis     hands, feet , knees   . Stroke     left sided weakness     Past Surgical History  Procedure Date  . Abdominal hysterectomy   . Cholecystectomy   . Rectocele repair   . Cervical fusion   . Tubal ligation   .  Wisdom tooth extraction   . Cystoscopy with injection 05/18/2011    Procedure: CYSTOSCOPY WITH INJECTION;  Surgeon: Anner Crete, MD;  Location: [facility];  Service: Urology;  Laterality: N/A;  Injection of Macroplastique    No family history on file.  History  Substance Use Topics  . Smoking status: Former Smoker    Quit date: 02/23/1991  . Smokeless tobacco: Never Used   Comment: Quite 20 Yrs Ago  . Alcohol Use: No     hx of no longer uses     OB History    Grav Para Term Preterm Abortions TAB SAB Ect Mult Living                  Review of Systems  Constitutional: Negative for fever.  Respiratory: Negative for shortness of breath.   Gastrointestinal: Positive for nausea, vomiting, abdominal pain, diarrhea, constipation, blood in stool and hematochezia. Negative for rectal pain.  Genitourinary: Negative for dysuria and decreased urine volume.  Neurological: Negative for dizziness and weakness.    Allergies  Aspirin; Penicillins; Sulfa antibiotics; and Latex  Home Medications   Current Outpatient Rx  Name Route Sig Dispense Refill  . ALPRAZOLAM 0.5 MG PO TABS Oral Take 0.5 mg by mouth 3 (three) times daily.     Marland Kitchen DICLOFENAC SODIUM 50 MG PO TBEC Oral Take 50  mg by mouth 2 (two) times daily.     Marland Kitchen DOCUSATE SODIUM 100 MG PO CAPS Oral Take 200 mg by mouth 2 (two) times daily.     Marland Kitchen ESCITALOPRAM OXALATE 20 MG PO TABS Oral Take 20 mg by mouth every morning.     . INSULIN ASPART 100 UNIT/ML [place] SOLN Subcutaneous Inject 3 Units into the skin 3 (three) times daily before meals. Injects 3 units every morning, dinner (noon), and supper.    . INSULIN GLARGINE 100 UNIT/ML [place] SOLN Subcutaneous Inject 31 Units into the skin at bedtime.     Marland Kitchen LEVOTHYROXINE SODIUM 50 MCG PO TABS Oral Take 50 mcg by mouth every morning.     Marland Kitchen LORATADINE 10 MG PO TABS Oral Take 10 mg by mouth daily.     Marland Kitchen METOPROLOL TARTRATE 50 MG PO TABS Oral Take 50 mg by mouth 2 (two) times daily.     Marland Kitchen ONE-DAILY MULTI  VITAMINS PO TABS Oral Take 1 tablet by mouth daily with lunch.     . MULTIVITAMIN & MINERAL PO Oral Take 1 tablet by mouth daily. Multivitamin with calcium 1000mg , magnesium 400mg  and zinc 15 mg per daughter. Patient takes at nite    . FISH OIL 1000 MG PO CAPS Oral Take 1 capsule by mouth 4 (four) times daily.     Marland Kitchen PANTOPRAZOLE SODIUM 40 MG PO TBEC Oral Take 40 mg by mouth every morning.     Marland Kitchen MIRALAX PO Oral Take 17 g by mouth every morning.     Marland Kitchen POTASSIUM CHLORIDE CRYS ER 20 MEQ PO TBCR Oral Take 20 mEq by mouth daily with lunch.     . ROSUVASTATIN CALCIUM 10 MG PO TABS Oral Take 10 mg by mouth. Pt takes every other nite    . SOLIFENACIN SUCCINATE 5 MG PO TABS Oral Take 5 mg by mouth every morning.     Marland Kitchen VITAMIN C 500 MG PO TABS Oral Take 500 mg by mouth every morning.     Marland Kitchen VITAMIN E 400 UNITS PO CAPS Oral Take 400 Units by mouth every morning.     Marland Kitchen ZOLPIDEM TARTRATE 5 MG PO TABS Oral Take 5 mg by mouth at bedtime.       BP 185/98  Pulse 87  Temp 99.1 F (37.3 C) (Rectal)  Resp 17  SpO2 96%  Physical Exam  Constitutional: She is oriented to person, place, and time. She appears well-developed.  Eyes: Pupils are equal, round, and reactive to light.  Neck: Normal range of motion.  Cardiovascular: Normal rate.   Pulmonary/Chest: Effort normal.  Abdominal: Soft. Bowel sounds are normal. She exhibits no distension. There is generalized tenderness. There is no rebound and no guarding.  Genitourinary: Rectal exam shows no external hemorrhoid, no internal hemorrhoid, no mass and no tenderness. Guaiac positive stool.  Musculoskeletal:       L sided hemiparesis post CVA   Neurological: She is alert and oriented to person, place, and time.  Skin: Skin is warm and dry.    ED Course  Procedures (including critical care time)  Labs Reviewed  URINALYSIS, ROUTINE W REFLEX MICROSCOPIC - Abnormal; Notable for the following:    APPearance CLOUDY (*)     Protein, ur >300 (*)     All other  components within normal limits  CBC - Abnormal; Notable for the following:    WBC 22.5 (*)  REPEATED TO VERIFY   All other components within normal limits  DIFFERENTIAL -  Abnormal; Notable for the following:    Neutrophils Relative 85 (*)     Neutro Abs 19.1 (*)     Lymphocytes Relative 9 (*)     Monocytes Absolute 1.4 (*)     All other components within normal limits  URINE MICROSCOPIC-ADD ON - Abnormal; Notable for the following:    Bacteria, UA MANY (*)     Casts HYALINE CASTS (*)     All other components within normal limits   Dg Abd 2 Views  08/16/2011  *RADIOLOGY REPORT*  Clinical Data: Abdominal pain and vomiting for 24 hours.  ABDOMEN - 2 VIEW  Comparison: 06/29/2011  Findings: Scattered gas and stool throughout the colon.  No small or large bowel distension.  No free intra-abdominal air.  No abnormal air fluid levels.  Surgical clips in the right upper quadrant.  Vascular calcifications.  Degenerative changes in the lumbar spine and hips.  No radiopaque stones.  No significant changes since the previous study.  IMPRESSION: Nonobstructive bowel gas pattern.  Original Report Authenticated By: Marlon Pel, M.D.     No diagnosis found.    MDM   Patient with diffuse abdominal pain , bloody diarrhea and a WBC of 22.5-- will obtain abdominal CT scan         Arman Filter, NP 08/17/11 [phone]

## 2011-08-17 NOTE — Consult Note (Signed)
Reason for Consult:Proctitis Referring Physician: [facility].  Sarah Skinner HPI: This is a 71 year old female with an acute bout of hematochezia after an enema.  Her daughter reports that she gave her an enema to help relieve her bowel movements as she had impaction issues in the past.  After the bowel movement she started to have hematochezia.  It was also associated with abdominal pain, but she has a history of chronic abdominal pain.  No prior history of hematochezia.  In the ER a CT scan was performed and she was identified to have a proctitis.  Her WBC was also noted to be markedly elevated at 22,000.  No reports of any fever and at this time she reports feeling better.  Per her report she had a colonoscopy with Dr. Loreta Ave 5-6 years prior.  Constipation is a chronic problem for her.  Additionally she did have some nausea and vomiting at the onset of the hematochezia.  Past Medical History  Diagnosis Date  . Hypertension   . Diabetes mellitus   . Hypothyroidism   . Blood transfusion     hx of with childbirth   . Stress incontinence     stress incontinence   . GERD (gastroesophageal reflux disease)   . Headache     hx of   . Fibromyalgia   . Anxiety   . Arthritis     hands, feet , knees   . Stroke     left sided weakness     Past Surgical History  Procedure Date  . Abdominal hysterectomy   . Cholecystectomy   . Rectocele repair   . Cervical fusion   . Tubal ligation   . Wisdom tooth extraction   . Cystoscopy with injection 05/18/2011    Procedure: CYSTOSCOPY WITH INJECTION;  Surgeon: Anner Crete, MD;  Location: [facility];  Service: Urology;  Laterality: N/A;  Injection of Macroplastique    Family History  Problem Relation Age of Onset  . Heart failure Mother   . COPD Father   . Diabetes Sister   . Heart failure Sister     Social History:  reports that she quit smoking about 20 years ago. Her smoking use included Cigarettes. She has a 100 pack-year smoking  history. She has never used smokeless tobacco. She reports that she does not drink alcohol or use illicit drugs.  Allergies:  Allergies  Allergen Reactions  . Aspirin     Red splotches all over body   . Penicillins Hives    Red splotches all over body  . Sulfa Antibiotics Hives    Red splotches all over body  . Latex Rash    Medications:  Scheduled:   . sodium chloride   Intravenous Once  . ALPRAZolam  0.5 mg Oral TID  . atorvastatin  20 mg Oral q1800  . ciprofloxacin  400 mg Intravenous Once  . ciprofloxacin  400 mg Intravenous BID  . darifenacin  7.5 mg Oral Daily  . docusate sodium  200 mg Oral BID  . escitalopram  20 mg Oral Daily  . insulin aspart  0-5 Units Subcutaneous QHS  . insulin aspart  0-9 Units Subcutaneous TID WC  . insulin aspart  3 Units Subcutaneous TID AC  . insulin glargine  31 Units Subcutaneous QHS  . levothyroxine  50 mcg Oral Q breakfast  . loratadine  10 mg Oral Daily  . metoprolol  50 mg Oral BID  . metronidazole  500 mg Intravenous Once  .  metronidazole  500 mg Intravenous Q8H  . multivitamin with minerals  1 tablet Oral Daily  . omega-3 acid ethyl esters  1 g Oral QID  . pantoprazole  40 mg Oral Q1200  . polyethylene glycol  17 g Oral BID  . potassium chloride SA  20 mEq Oral Q lunch  . DISCONTD: metronidazole  500 mg Intravenous Q8H  . DISCONTD: multivitamin  1 tablet Oral Q lunch   Continuous:   . 0.9 % NaCl with KCl 40 mEq / L 100 mL/hr at 08/17/11 1049    Results for orders placed during the hospital encounter of 08/16/11 (from the past 24 hour(s))  CBC     Status: Abnormal   Collection Time   08/16/11  9:46 PM      Component Value Range   WBC 22.5 (*) 4.0 - 10.5 K/uL   RBC 4.74  3.87 - 5.11 MIL/uL   Hemoglobin 14.6  12.0 - 15.0 g/dL   HCT 16.1  09.6 - 04.5 %   MCV 88.4  78.0 - 100.0 fL   MCH 30.8  26.0 - 34.0 pg   MCHC 34.8  30.0 - 36.0 g/dL   RDW 40.9  81.1 - 91.4 %   Platelets 223  150 - 400 K/uL  DIFFERENTIAL     Status:  Abnormal   Collection Time   08/16/11  9:46 PM      Component Value Range   Neutrophils Relative 85 (*) 43 - 77 %   Neutro Abs 19.1 (*) 1.7 - 7.7 K/uL   Lymphocytes Relative 9 (*) 12 - 46 %   Lymphs Abs 2.0  0.7 - 4.0 K/uL   Monocytes Relative 6  3 - 12 %   Monocytes Absolute 1.4 (*) 0.1 - 1.0 K/uL   Eosinophils Relative 0  0 - 5 %   Eosinophils Absolute 0.0  0.0 - 0.7 K/uL   Basophils Relative 0  0 - 1 %   Basophils Absolute 0.0  0.0 - 0.1 K/uL  URINALYSIS, ROUTINE W REFLEX MICROSCOPIC     Status: Abnormal   Collection Time   08/16/11  9:58 PM      Component Value Range   Color, Urine YELLOW  YELLOW   APPearance CLOUDY (*) CLEAR   Specific Gravity, Urine 1.016  1.005 - 1.030   pH 7.5  5.0 - 8.0   Glucose, UA NEGATIVE  NEGATIVE mg/dL   Hgb urine dipstick NEGATIVE  NEGATIVE   Bilirubin Urine NEGATIVE  NEGATIVE   Ketones, ur NEGATIVE  NEGATIVE mg/dL   Protein, ur >782 (*) NEGATIVE mg/dL   Urobilinogen, UA 0.2  0.0 - 1.0 mg/dL   Nitrite NEGATIVE  NEGATIVE   Leukocytes, UA NEGATIVE  NEGATIVE  URINE MICROSCOPIC-ADD ON     Status: Abnormal   Collection Time   08/16/11  9:58 PM      Component Value Range   Squamous Epithelial / LPF RARE  RARE   RBC / HPF 0-2  <3 RBC/hpf   Bacteria, UA MANY (*) RARE   Casts HYALINE CASTS (*) NEGATIVE   Urine-Other MUCOUS PRESENT    OCCULT BLOOD, POC DEVICE     Status: Normal   Collection Time   08/17/11 12:30 AM      Component Value Range   Fecal Occult Bld POSITIVE    BASIC METABOLIC PANEL     Status: Abnormal   Collection Time   08/17/11  3:30 AM      Component Value Range  Sodium 130 (*) 135 - 145 mEq/L   Potassium 3.2 (*) 3.5 - 5.1 mEq/L   Chloride 90 (*) 96 - 112 mEq/L   CO2 26  19 - 32 mEq/L   Glucose, Bld 215 (*) 70 - 99 mg/dL   BUN 8  6 - 23 mg/dL   Creatinine, Ser 8.29  0.50 - 1.10 mg/dL   Calcium 9.2  8.4 - 56.2 mg/dL   GFR calc non Af Amer 88 (*) >90 mL/min   GFR calc Af Amer >90  >90 mL/min  GLUCOSE, CAPILLARY     Status:  Abnormal   Collection Time   08/17/11  3:35 AM      Component Value Range   Glucose-Capillary 234 (*) 70 - 99 mg/dL   Comment 1 Notify RN    OCCULT BLOOD, POC DEVICE     Status: Normal   Collection Time   08/17/11  6:55 AM      Component Value Range   Fecal Occult Bld NEGATIVE    LIPASE, BLOOD     Status: Normal   Collection Time   08/17/11  7:48 AM      Component Value Range   Lipase 12  11 - 59 U/L  GLUCOSE, CAPILLARY     Status: Abnormal   Collection Time   08/17/11  8:28 AM      Component Value Range   Glucose-Capillary 177 (*) 70 - 99 mg/dL  PROTIME-INR     Status: Normal   Collection Time   08/17/11  9:35 AM      Component Value Range   Prothrombin Time 13.2  11.6 - 15.2 seconds   INR 0.98  0.00 - 1.49  APTT     Status: Normal   Collection Time   08/17/11  9:35 AM      Component Value Range   aPTT 30  24 - 37 seconds  TSH     Status: Normal   Collection Time   08/17/11  9:35 AM      Component Value Range   TSH 1.318  0.350 - 4.500 uIU/mL  HEMOGLOBIN A1C     Status: Abnormal   Collection Time   08/17/11  9:35 AM      Component Value Range   Hemoglobin A1C 6.8 (*) <5.7 %   Mean Plasma Glucose 148 (*) <117 mg/dL  HEPATIC FUNCTION PANEL     Status: Abnormal   Collection Time   08/17/11  9:35 AM      Component Value Range   Total Protein 6.9  6.0 - 8.3 g/dL   Albumin 3.5  3.5 - 5.2 g/dL   AST 27  0 - 37 U/L   ALT 51 (*) 0 - 35 U/L   Alkaline Phosphatase 74  39 - 117 U/L   Total Bilirubin 0.7  0.3 - 1.2 mg/dL   Bilirubin, Direct 0.2  0.0 - 0.3 mg/dL   Indirect Bilirubin 0.5  0.3 - 0.9 mg/dL  GLUCOSE, CAPILLARY     Status: Abnormal   Collection Time   08/17/11 12:38 PM      Component Value Range   Glucose-Capillary 200 (*) 70 - 99 mg/dL   Comment 1 Documented in Chart     Comment 2 Notify RN       Ct Abdomen Pelvis W Contrast  08/17/2011  *RADIOLOGY REPORT*  Clinical Data: Abdominal pain and constipation for 4 days. Diarrhea.  Nausea and vomiting.  White cell  count 22.5.  CT ABDOMEN  AND PELVIS WITH CONTRAST  Technique:  Multidetector CT imaging of the abdomen and pelvis was performed following the standard protocol during bolus administration of intravenous contrast.  Contrast: OMNIPAQUE IOHEXOL 300 MG/ML  SOLN  Comparison: 02/04/2010  Findings: Fibrosis in the lung bases.  Low attenuation change throughout the liver suggesting fatty infiltration.  Prominence of the lateral segment left lobe suggest mild cirrhosis.  No focal liver lesions.  Surgical absence of the gallbladder.  The pancreas, spleen, adrenal glands, kidneys, and retroperitoneal lymph nodes are unremarkable.  Calcification of the abdominal aorta without aneurysm.  Prominent visceral adipose tissues.  The stomach, small bowel, and colon are not abnormally distended.  No free air or free fluid in the abdomen.  Pelvis:  The uterus is surgically absent.  No abnormal adnexal masses.  The bladder wall is not thickened.  Fat in the inguinal canals.  No free or loculated pelvic fluid collections.  There is a suggestion of thickening of the wall of the rectum although this may be due to nondistension.  Infiltration in the perirectal fat. Changes may represent proctitis or other inflammatory process. Follow-up evaluation of the rectum is recommended to exclude an occult mass lesion.  Degenerative changes in the lumbar spine. Spondylolysis and mild spondylolisthesis at L5-S1.  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  Suggestion of thickening of the wall of the rectum with stranding in the perirectal fat.  This is most likely representing inflammatory process such as proctitis although follow-up evaluation of the rectum is recommended to exclude occult mass in the nondistended rectum.  Original Report Authenticated By: Marlon Pel, M.D.   Dg Abd 2 Views  08/16/2011  *RADIOLOGY REPORT*  Clinical Data: Abdominal pain and vomiting for 24 hours.  ABDOMEN - 2 VIEW  Comparison:  06/29/2011  Findings: Scattered gas and stool throughout the colon.  No small or large bowel distension.  No free intra-abdominal air.  No abnormal air fluid levels.  Surgical clips in the right upper quadrant.  Vascular calcifications.  Degenerative changes in the lumbar spine and hips.  No radiopaque stones.  No significant changes since the previous study.  IMPRESSION: Nonobstructive bowel gas pattern.  Original Report Authenticated By: Marlon Pel, M.D.    ROS:  As stated above in the HPI otherwise negative.  Blood pressure 156/63, pulse 77, temperature 98.2 F (36.8 C), temperature source Oral, resp. rate 16, height 5\' 2"  (1.575 m), weight 93.441 kg (206 lb), SpO2 97.00%.    PE: Gen: NAD, Alert and Oriented HEENT:  [place]/AT, EOMI Neck: Supple, no LAD Lungs: CTA Bilaterally CV: RRR without M/G/R ABM: Soft, NTND, +BS Ext: No C/C/E Rectal: No evidence of any fissures, thrombosed hemorrhoids, or masses.  No gross blood on the glove.  Assessment/Plan: 1) Proctitis. 2) Possible cirrhosis on CT scan.   Further evaluation with a FFS is required at this time.  She does have perirectal stranding and it can be suggestive of an infectious process versus IBD.    Plan: 1) FFS tomorrow.  Wyman Meschke D 08/17/2011, 3:58 PM

## 2011-08-17 NOTE — ED Notes (Signed)
Pt returned from CT °

## 2011-08-17 NOTE — H&P (Addendum)
Hospital Admission Note Date: 08/17/2011  Patient name: Sarah Skinner Medical record number: [id number] Date of birth: 06/30/1940 Age: 71 y.o. Gender: female PCP: Redmond Baseman, MD  Attending physician: Maryruth Bun Curtina Grills, MD Emergency Contact:  Fabiha Rougeau (husband) [phone] Code Status: DNR  Chief Complaint: Stomach cramps, bloody stools.  History of Present Illness: Sarah Skinner is an 71 y.o. female with a PMH of GERD, DM, HTN who presented to the hospital with a chief complaint of abdominal cramping and bright red blood per rectum that has developed over the past 24 hours.  The abdominal pain is somewhat chronic.   The patient also has had nausea and vomiting that began last night.  No hematemesis.  Tried to take bland foods yesterday, including yogurt, which she was not able to keep down.  No sick contacts.  Had a colonoscopy 5-6 years ago, done by Dr. Loreta Ave, and was told her colon was "black".  The patient endorses chronic constipation and used an OTC enema yesterday resulting in loose stools.  Takes Ibuprofen as needed for pain, but does not take every day (once or twice a week).  Past Medical History Past Medical History  Diagnosis Date  . Hypertension   . Diabetes mellitus   . Hypothyroidism   . Blood transfusion     hx of with childbirth   . Stress incontinence     stress incontinence   . GERD (gastroesophageal reflux disease)   . Headache     hx of   . Fibromyalgia   . Anxiety   . Arthritis     hands, feet , knees   . Stroke     left sided weakness     Past Surgical History Past Surgical History  Procedure Date  . Abdominal hysterectomy   . Cholecystectomy   . Rectocele repair   . Cervical fusion   . Tubal ligation   . Wisdom tooth extraction   . Cystoscopy with injection 05/18/2011    Procedure: CYSTOSCOPY WITH INJECTION;  Surgeon: Anner Crete, MD;  Location: [facility];  Service: Urology;  Laterality: N/A;  Injection of Macroplastique     Meds: Prior to Admission medications   Medication Sig Start Date End Date Taking? Authorizing Provider  ALPRAZolam Prudy Feeler) 0.5 MG tablet Take 0.5 mg by mouth 3 (three) times daily.    Yes Historical Provider, MD  diclofenac (VOLTAREN) 50 MG EC tablet Take 50 mg by mouth 2 (two) times daily.    Yes Historical Provider, MD  docusate sodium (COLACE) 100 MG capsule Take 200 mg by mouth 2 (two) times daily.    Yes Historical Provider, MD  escitalopram (LEXAPRO) 20 MG tablet Take 20 mg by mouth every morning.    Yes Historical Provider, MD  insulin aspart (NOVOLOG) 100 UNIT/ML injection Inject 3 Units into the skin 3 (three) times daily before meals. Injects 3 units every morning, dinner (noon), and supper.   Yes Historical Provider, MD  insulin glargine (LANTUS) 100 UNIT/ML injection Inject 31 Units into the skin at bedtime.    Yes Historical Provider, MD  levothyroxine (SYNTHROID, LEVOTHROID) 50 MCG tablet Take 50 mcg by mouth every morning.    Yes Historical Provider, MD  loratadine (CLARITIN) 10 MG tablet Take 10 mg by mouth daily.    Yes Historical Provider, MD  metoprolol (LOPRESSOR) 50 MG tablet Take 50 mg by mouth 2 (two) times daily.    Yes Historical Provider, MD  Multiple Vitamin (MULTIVITAMIN) tablet Take 1 tablet by  mouth daily with lunch.    Yes Historical Provider, MD  Multiple Vitamins-Minerals (MULTIVITAMIN & MINERAL PO) Take 1 tablet by mouth daily. Multivitamin with calcium 1000mg , magnesium 400mg  and zinc 15 mg per daughter. Patient takes at nite   Yes Historical Provider, MD  Omega-3 Fatty Acids (FISH OIL) 1000 MG CAPS Take 1 capsule by mouth 4 (four) times daily.    Yes Historical Provider, MD  pantoprazole (PROTONIX) 40 MG tablet Take 40 mg by mouth every morning.    Yes Historical Provider, MD  Polyethylene Glycol 3350 (MIRALAX PO) Take 17 g by mouth every morning.    Yes Historical Provider, MD  potassium chloride SA (K-DUR,KLOR-CON) 20 MEQ tablet Take 20 mEq by mouth daily  with lunch.    Yes Historical Provider, MD  rosuvastatin (CRESTOR) 10 MG tablet Take 10 mg by mouth. Pt takes every other nite   Yes Historical Provider, MD  solifenacin (VESICARE) 5 MG tablet Take 5 mg by mouth every morning.    Yes Historical Provider, MD  vitamin C (ASCORBIC ACID) 500 MG tablet Take 500 mg by mouth every morning.    Yes Historical Provider, MD  vitamin E 400 UNIT capsule Take 400 Units by mouth every morning.    Yes Historical Provider, MD  zolpidem (AMBIEN) 5 MG tablet Take 5 mg by mouth at bedtime.    Yes Historical Provider, MD    Allergies: Aspirin; Penicillins; Sulfa antibiotics; and Latex  Social History: History   Social History  . Marital Status: Married    Spouse Name: Shon Hale    Number of Children: 6  . Years of Education: 12   Occupational History  . Retired IT sales professional    Social History Main Topics  . Smoking status: Former Smoker -- 2.0 packs/day for 50 years    Types: Cigarettes    Quit date: 02/23/1991  . Smokeless tobacco: Never Used   Comment: Quite 20 Yrs Ago  . Alcohol Use: No     hx of no longer uses   . Drug Use: No  . Sexually Active: Not on file   Other Topics Concern  . Not on file   Social History Narrative   Married.  Lives in [place] with her husband.  Wheelchair bound.  Transfers with assistance.  Non-ambulatory.    Family History:  Family History  Problem Relation Age of Onset  . Heart failure Mother   . COPD Father   . Diabetes Sister   . Heart failure Sister     Review of Systems: Constitutional: No fever, no chills;  Appetite normal; + weight loss, no weight gain.  HEENT: No blurry vision, no diplopia, no pharyngitis, + occasiona dysphagia, +dry eyes CV: No chest pain, no palpitations.  Resp: No SOB, no cough. GI: + nausea, + vomiting, + occasional diarrhea, no melena, +hematochezia, + chronic constipation.  GU: No dysuria, no hematuria.  MSK: + myalgias, + arthralgias.  Neuro:  + headache, +left hemiparesis, no  history of seizures.  Psych: + depression, + anxiety.  Endo: + thyroid disease, + DM, + heat intolerance, no cold intolerance, no polyuria, no polydipsia  Skin: No rashes, no skin lesions.  Heme: No easy bruising, no history of blood diseases.   Physical Exam: Blood pressure 165/73, pulse 91, temperature 99 F (37.2 C), temperature source Oral, resp. rate 20, SpO2 97.00%. BP 165/73  Pulse 91  Temp 99 F (37.2 C) (Oral)  Resp 20  SpO2 97%  General Appearance:  Alert, cooperative, no distress, appears stated age  Head:    Normocephalic, without obvious abnormality, atraumatic  Eyes:    PERRL, conjunctiva/corneas clear, EOM's intact  Ears:    Normal external ear canals, both ears  Nose:   Nares normal, septum midline, mucosa normal, no drainage    or sinus tenderness  Throat:   Lips, mucosa, and tongue normal; edentulous upper, lower jaw: teeth in good condition  Neck:   Supple, symmetrical, trachea midline, no adenopathy;    thyroid:  no enlargement/tenderness/nodules; no carotid   bruit or JVD  Back:     Symmetric, no curvature, ROM normal, no CVA tenderness  Lungs:     Clear to auscultation bilaterally, respirations unlabored  Chest Wall:    No tenderness or deformity   Heart:    Regular rate and rhythm, S1 and S2 normal, no murmur, rub   or gallop  Abdomen:    Soft, NT, ND, + BS x 4  Rectal:    Per EDP: Rectal exam shows no external hemorrhoid, no internal hemorrhoid, no mass and no tenderness. Guaiac positive stool.   Extremities:   Extremities normal, atraumatic, no cyanosis or edema  Pulses:   2+ and symmetric all extremities  Skin:   Skin color, texture, turgor normal, no rashes or lesions  Lymph nodes:   Cervical, supraclavicular, and axillary nodes normal  Neurologic:   CNII-XII intact, + left hemiparesis   Lab results: Basic Metabolic Panel:  Lab 08/17/11 5621  NA 130*  K 3.2*  CL 90*  CO2 26  GLUCOSE 215*  BUN 8  CREATININE 0.65  CALCIUM 9.2  MG --  PHOS --    GFR The CrCl is unknown because both a height and weight (above a minimum accepted value) are required for this calculation.  CBC:  Lab 08/16/11 2146  WBC 22.5*  NEUTROABS 19.1*  HGB 14.6  HCT 41.9  MCV 88.4  PLT 223   CBG:  Lab 08/17/11 0335  GLUCAP 234*    Imaging results:   Ct Abdomen Pelvis W Contrast 08/17/2011    IMPRESSION: Probable cirrhosis and fatty infiltration in the liver.  Suggestion of thickening of the wall of the rectum with stranding in the perirectal fat.  This is most likely representing inflammatory process such as proctitis although follow-up evaluation of the rectum is recommended to exclude occult mass in the nondistended rectum.  Original Report Authenticated By: Marlon Pel, M.D.    Dg Abd 2 Views 08/16/2011 IMPRESSION: Nonobstructive bowel gas pattern.  Original Report Authenticated By: Marlon Pel, M.D.    Assessment & Plan: Principal Problem:  *Proctitis with rectal bleeding, nausea and vomiting  CT scan reviewed, consistent with proctitis, possibly related to recent enema administration.  Concurrent leukocytosis concerning for infection.  Admit and place on IV antibiotics with Cipro and Flagyl.  Rule out Clostridium difficile with PCR studies, although this is unlikely given the volume of stool noted in the colon and her history of chronic constipation requiring enemas.  Will request GI evaluation given the findings on CT scan as the patient may require a sigmoidoscopy for further evaluation of her rectum. (Spoke with Dr. Loreta Ave who will have Dr. Elnoria Howard evaluate the patient). Active Problems:  DM (diabetes mellitus)  Continue home regimen of Lantus and NovoLog. Add sliding scale.  Check hemoglobin A1c to determine overall glycemic control.  Hypertension  Continue home antihypertensives and monitor her blood pressure.  Depression with anxiety  Continue Lexapro and Xanax.  Fibromyalgia  The patient endorses chronic pain  from fibromyalgia. Hold Voltaren and nonsteroidal anti-inflammatories given history of rectal bleeding.  Use Vicodin for pain control.  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  PT/OT evaluations requested.  Medication the date for aspirin therapy due to allergy.  Control modifiable risk factors.  Hypokalemia  Likely secondary to GI losses. Replace in IV fluids.  Fatty liver with probable cirrhosis by CT scan  Check liver function studies to determine synthetic capacity of the liver as well as coagulation studies.  Chronic constipation  Stool seen throughout the colon on imaging studies. Would continue Colace and add twice a day MiraLAX. Would avoid further enemas for now.  Leukocytosis  Likely from proctitis. Treating with antibiotics.  Hyponatremia  Likely from dehydration although cirrhosis physiology could cause this. Hydrate and monitor.  Hypothyroidism  Check TSH to determine if she is appropriately treated given her complaints of heat intolerance.  GERD (gastroesophageal reflux disease)  Continue PPI therapy.  Stress incontinence  Continue Vesicare.  Prophylaxis: SCDs for DVT prophylaxis given rectal bleeding.  Time Spent On Admission: One hour.  Jazmyn Offner 08/17/2011, 7:56 AM Pager [phone]

## 2011-08-17 NOTE — ED Provider Notes (Signed)
Pt signed out to me at shift change. Pt with nausea, vomiting, abdominal pain, rectal pain and rectal bleeding since yesterday. CT showed proctitis and liver cirrhosis. I did rectal exam, which showed no stool in her rectum, no blood, however, per nurse, bowel movements in ED that are watery, mucosy, red. Pt continues to have nausea and vomiting in ED. WBC 22. Will admit. Cipro/flagyl ordered.  Spoke with [facility], will admit.   Lottie Mussel, [place] 08/17/11 [phone]

## 2011-08-17 NOTE — Evaluation (Signed)
Occupational Therapy Evaluation Patient Details Name: Sarah Skinner MRN: [id number] DOB: August 01, 1940 Today's Date: 08/17/2011 Time: 4098-1191 OT Time Calculation (min): 19 min  OT Assessment / Plan / Recommendation Clinical Impression  This 71 year old female was admitted with proctitis/rectal bleeding and nausea/vomiting.  She has a h/o CVAs and has residual L hemiparesis.  Pt reports that husband assisted her to the wheelchair daily and that she had assistance for adls.  Visitor feels that pt is weaker than baseline.  She is appropriate for skilled OT to increase strength/endurance for adls/mobility.      OT Assessment  Patient needs continued OT Services    Follow Up Recommendations  Home health OT (vs [facility] depending on progress)    Barriers to Discharge      Equipment Recommendations  None recommended by OT    Recommendations for Other Services    Frequency  Min 2X/week    Precautions / Restrictions Precautions Precautions: Fall   Pertinent Vitals/Pain No pain at time of eval    ADL  Eating/Feeding: Simulated;Set up Where Assessed - Eating/Feeding: Bed level Grooming: Simulated;Minimal assistance Where Assessed - Grooming: Supine, head of bed up Upper Body Bathing: Simulated;Moderate assistance Where Assessed - Upper Body Bathing: Supine, head of bed up Lower Body Bathing: Simulated;+1 Total assistance Where Assessed - Lower Body Bathing: Rolling right and/or left Upper Body Dressing: Simulated;Moderate assistance Where Assessed - Upper Body Dressing: Supine, head of bed up Lower Body Dressing: Simulated;+1 Total assistance Where Assessed - Lower Body Dressing: Rolling right and/or left Toileting - Clothing Manipulation and Hygiene: Simulated;+1 Total assistance Where Assessed - Toileting Clothing Manipulation and Hygiene: Rolling right and/or left Transfers/Ambulation Related to ADLs: sat EOB only:  pt's bed did not go low enough to support feet on floor (bari bed)     OT Diagnosis: Generalized weakness  OT Problem List: Decreased strength;Decreased activity tolerance;Impaired balance (sitting and/or standing);Impaired UE functional use OT Treatment Interventions: Self-care/ADL training;Therapeutic activities;Balance training;Patient/family education   OT Goals Acute Rehab OT Goals OT Goal Formulation: With patient Time For Goal Achievement: 08/24/11 Potential to Achieve Goals: Good Miscellaneous OT Goals Miscellaneous OT Goal #1: Pt will roll to L with min A and R with mod A for adls OT Goal: Miscellaneous Goal #1 - Progress: Goal set today Miscellaneous OT Goal #2: Pt will sit eob unsupported with min guard A x 5 minutes in prep for functional transfers OT Goal: Miscellaneous Goal #2 - Progress: Goal set today Miscellaneous OT Goal #3: Pt will transfer to 3:1 commode with mod A  OT Goal: Miscellaneous Goal #3 - Progress: Goal set today  Visit Information  Last OT Received On: 08/17/11 Assistance Needed: +2 (+1 to sit eob)    Subjective Data  Subjective: " I mostly use the bed pan.  My husband gets me up to the w/c for 2-3 hours a day" Patient Stated Goal: none stated.  Agreeable to OT   Prior Functioning  Home Living Lives With: Spouse Available Help at Discharge:  ([facility]) Bathroom Toilet:  (3:1) Home Adaptive Equipment: Hospital bed;Wheelchair - manual;Bedside commode/3-in-1 Prior Function Level of Independence: Needs assistance Communication Communication: No difficulties Dominant Hand: Right    Cognition  Overall Cognitive Status: Appears within functional limits for tasks assessed/performed Arousal/Alertness: Awake/alert Orientation Level: Appears intact for tasks assessed Behavior During Session: [facility] for tasks performed    Extremity/Trunk Assessment Right Upper Extremity Assessment RUE ROM/Strength/Tone: Within functional levels Left Upper Extremity Assessment LUE ROM/Strength/Tone: Deficits LUE  ROM/Strength/Tone Deficits: hemiparesis.  PROM WFLs shoulder.  Has some recruitment of biceps/triceps in gravity eliminated plane; wrist and fingers shortened with no AROM but able to open fist 1/2 way Trunk Assessment Trunk Assessment:  (able to sit eob without feet supported with min guard 2 min)   Mobility Bed Mobility Bed Mobility: Rolling Right;Rolling Left;Right Sidelying to Sit Rolling Right: 2: Max assist Rolling Left: 3: Mod assist (to turn completely) Right Sidelying to Sit: 2: Max assist   Exercise    Balance    End of Session OT - End of Session Activity Tolerance: Patient limited by fatigue Patient left: in bed;with call bell/phone within reach;with family/visitor present   Kiven Vangilder 08/17/2011, 4:26 PM Marica Otter, OTR/L [phone] 08/17/2011

## 2011-08-17 NOTE — ED Provider Notes (Signed)
Medical screening examination/treatment/procedure(s) were performed by non-physician practitioner and as supervising physician I was immediately available for consultation/collaboration.   Jari Carollo M Alleta Avery, MD 08/17/11 0841 

## 2011-08-17 NOTE — ED Notes (Signed)
CT notified RN that BMP needed to be ordered to be sure of patients kidney function. Blood drawn and sent to lab.

## 2011-08-17 NOTE — ED Provider Notes (Signed)
Medical screening examination/treatment/procedure(s) were conducted as a shared visit with non-physician practitioner(s) and myself.  I personally evaluated the patient during the encounter  Pt with rectal bleeding and proctitis on CT. Will admit. IV abx now.   Lyanne Co, MD 08/17/11 [phone]

## 2011-08-18 ENCOUNTER — Encounter (HOSPITAL_COMMUNITY)
Admission: EM | Disposition: A | Discharge status: Home Health | Payer: Self-pay | Source: Home / Self Care | Attending: Internal Medicine

## 2011-08-18 ENCOUNTER — Encounter (HOSPITAL_COMMUNITY): Payer: Self-pay | Admitting: *Deleted

## 2011-08-18 DIAGNOSIS — K6289 Other specified diseases of anus and rectum: Secondary | ICD-10-CM

## 2011-08-18 DIAGNOSIS — K625 Hemorrhage of anus and rectum: Secondary | ICD-10-CM

## 2011-08-18 DIAGNOSIS — E1165 Type 2 diabetes mellitus with hyperglycemia: Secondary | ICD-10-CM

## 2011-08-18 DIAGNOSIS — D7289 Other specified disorders of white blood cells: Secondary | ICD-10-CM

## 2011-08-18 HISTORY — PX: FLEXIBLE SIGMOIDOSCOPY: SHX5431

## 2011-08-18 LAB — CBC
HCT: 37.6 % (ref 36.0–46.0)
Hemoglobin: 12.8 g/dL (ref 12.0–15.0)
MCH: 30.2 pg (ref 26.0–34.0)
MCHC: 34 g/dL (ref 30.0–36.0)

## 2011-08-18 LAB — GLUCOSE, CAPILLARY
Glucose-Capillary: 123 mg/dL — ABNORMAL HIGH (ref 70–99)
Glucose-Capillary: 127 mg/dL — ABNORMAL HIGH (ref 70–99)
Glucose-Capillary: 96 mg/dL (ref 70–99)
Glucose-Capillary: 149 mg/dL — ABNORMAL HIGH (ref 70–99)

## 2011-08-18 LAB — BASIC METABOLIC PANEL
BUN: 9 mg/dL (ref 6–23)
CO2: 26 mEq/L (ref 19–32)
Chloride: 93 mEq/L — ABNORMAL LOW (ref 96–112)
Glucose, Bld: 123 mg/dL — ABNORMAL HIGH (ref 70–99)
Potassium: 3.5 mEq/L (ref 3.5–5.1)

## 2011-08-18 SURGERY — SIGMOIDOSCOPY, FLEXIBLE
Anesthesia: Moderate Sedation

## 2011-08-18 MED ORDER — FENTANYL CITRATE 0.05 MG/ML IJ SOLN
INTRAMUSCULAR | Status: DC | PRN
  Administered 2011-08-18: 25 ug via INTRAVENOUS

## 2011-08-18 MED ORDER — DIPHENHYDRAMINE HCL 50 MG/ML IJ SOLN
INTRAMUSCULAR | Status: AC
  Filled 2011-08-18: qty 1

## 2011-08-18 MED ORDER — FENTANYL CITRATE 0.05 MG/ML IJ SOLN
INTRAMUSCULAR | Status: AC
  Filled 2011-08-18: qty 2

## 2011-08-18 MED ORDER — MIDAZOLAM HCL 10 MG/2ML IJ SOLN
INTRAMUSCULAR | Status: DC | PRN
  Administered 2011-08-18: 2 mg via INTRAVENOUS
  Administered 2011-08-18: 1 mg via INTRAVENOUS

## 2011-08-18 MED ORDER — FLEET ENEMA 7-19 GM/118ML RE ENEM
ENEMA | RECTAL | Status: AC
  Filled 2011-08-18: qty 1

## 2011-08-18 MED ORDER — MIDAZOLAM HCL 10 MG/2ML IJ SOLN
INTRAMUSCULAR | Status: AC
  Filled 2011-08-18: qty 2

## 2011-08-18 NOTE — Progress Notes (Signed)
Patient ID: Sarah Skinner, female   DOB: 11-01-1940, 71 y.o.   MRN: [id number] [facility] PROGRESS NOTE  Sarah Skinner JYN:[id number] DOB: 1940/04/23 DOA: 08/16/2011 PCP: Redmond Baseman, MD   Assessment & Plan:  Principal Problem:  *Proctitis with rectal bleeding, nausea and vomiting  CT scan reviewed, consistent with proctitis, possibly related to recent enema administration.  Concurrent leukocytosis concerning for infection - WBC trending down Admit and place on IV antibiotics with Cipro and Flagyl.  Rule out Clostridium difficile with PCR studies F/u flex sigmoidoscopy  Active Problems:  DM (diabetes mellitus)  Continue home regimen of Lantus and NovoLog. Add sliding scale.  Check hemoglobin A1c- pending Hypertension  Continue home antihypertensives and monitor her blood pressure. Depression with anxiety  Continue Lexapro and Xanax. Fibromyalgia  The patient endorses chronic pain from fibromyalgia. Hold Voltaren and nonsteroidal anti-inflammatories given history of rectal bleeding.  Use Vicodin for pain control. 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  PT/OT evaluations requested.  Medication the date for aspirin therapy due to allergy.  Control modifiable risk factors. Hypokalemia  Likely secondary to GI losses. Replaced in IV fluids. Fatty liver with probable cirrhosis by CT scan  Check liver function studies to determine synthetic capacity of the liver  Chronic constipation  Stool seen throughout the colon on imaging studies. continue Colace and add twice a day MiraLAX. Would avoid further enemas for now. Leukocytosis  Likely from proctitis. Treating with antibiotics. Hyponatremia  Likely from dehydration. Hydrate and monitor. Hypothyroidism  Check TSH to determine if she is appropriately treated given her complaints of heat intolerance. GERD (gastroesophageal reflux disease)  Continue PPI  therapy. Stress incontinence  Continue Vesicare. Prophylaxis: SCDs for DVT prophylaxis given rectal bleeding.   Code Status: full code Family Communication: updated at bedside Disposition Plan:   [name], MD  [facility] Pager [phone]  If 7PM-7AM, please contact night-coverage [email or web address] Password [email or web address] 08/18/2011, 2:41 PM   LOS: 2 days   Subjective: No events overnight  Objective: Filed Vitals:   08/17/11 2207 08/18/11 0612 08/18/11 1403 08/18/11 1430  BP: 155/80 172/81 186/90 193/87  Pulse: 78 72    Temp: 98.6 F (37 C) 98.6 F (37 C) 98.2 F (36.8 C)   TempSrc: Oral Oral Oral   Resp: 18 18 17 20   Height:      Weight:      SpO2: 96% 95% 97% 100%    Intake/Output Summary (Last 24 hours) at 08/18/11 1441 Last data filed at 08/18/11 0600  Gross per 24 hour  Intake   1100 ml  Output      0 ml  Net   1100 ml    Exam:   General:  Pt is alert, follows commands appropriately, not in acute distress  Cardiovascular: Regular rate and rhythm, S1/S2, no murmurs, no rubs, no gallops  Respiratory: Clear to auscultation bilaterally, no wheezing, no crackles, no rhonchi  Abdomen: Soft, non tender, non distended, bowel sounds present, no guarding  Extremities: No edema, pulses DP and PT palpable bilaterally  Neuro: Grossly nonfocal  Data Reviewed: Basic Metabolic Panel:  Lab 08/18/11 9629 08/17/11 0330  NA 127* 130*  K 3.5 3.2*  CL 93* 90*  CO2 26 26  GLUCOSE 123* 215*  BUN 9 8  CREATININE 0.83 0.65  CALCIUM 8.7 9.2  MG -- --  PHOS -- --   Liver Function Tests:  Lab 08/17/11 0935  AST 27  ALT 51*  ALKPHOS 74  BILITOT 0.7  PROT 6.9  ALBUMIN 3.5    Lab 08/17/11 0748  LIPASE 12  AMYLASE --   No results found for this basename: AMMONIA:5 in the last 168 hours CBC:  Lab 08/18/11 0335 08/16/11 2146  WBC 14.2* 22.5*  NEUTROABS -- 19.1*  HGB 12.8 14.6  HCT 37.6 41.9  MCV 88.7 88.4  PLT 203 223   Cardiac Enzymes: No  results found for this basename: CKTOTAL:5,CKMB:5,CKMBINDEX:5,TROPONINI:5 in the last 168 hours BNP: No components found with this basename: POCBNP:5 CBG:  Lab 08/18/11 1228 08/18/11 0735 08/17/11 2259 08/17/11 1700 08/17/11 1238  GLUCAP 127* 123* 108* 158* 200*    Recent Results (from the past 240 hour(s))  URINE CULTURE     Status: Normal   Collection Time   08/16/11  9:58 PM      Component Value Range Status Comment   Specimen Description URINE, CLEAN CATCH   Final    Special Requests NONE   Final    Culture  Setup Time [id number]   Final    Colony Count NO GROWTH   Final    Culture NO GROWTH   Final    Report Status 08/17/2011 FINAL   Final      Studies: Ct Abdomen Pelvis W Contrast 08/17/2011  *RADIOLOGY REPORT*  Clinical Data: Abdominal pain and constipation for 4 days. Diarrhea.  Nausea and vomiting.  White cell count 22.5.  CT ABDOMEN AND PELVIS WITH CONTRAST  Technique:  Multidetector CT imaging of the abdomen and pelvis was performed following the standard protocol during bolus administration of intravenous contrast.  Contrast: OMNIPAQUE IOHEXOL 300 MG/ML  SOLN  Comparison: 02/04/2010  Findings: Fibrosis in the lung bases.  Low attenuation change throughout the liver suggesting fatty infiltration.  Prominence of the lateral segment left lobe suggest mild cirrhosis.  No focal liver lesions.  Surgical absence of the gallbladder.  The pancreas, spleen, adrenal glands, kidneys, and retroperitoneal lymph nodes are unremarkable.  Calcification of the abdominal aorta without aneurysm.  Prominent visceral adipose tissues.  The stomach, small bowel, and colon are not abnormally distended.  No free air or free fluid in the abdomen.  Pelvis:  The uterus is surgically absent.  No abnormal adnexal masses.  The bladder wall is not thickened.  Fat in the inguinal canals.  No free or loculated pelvic fluid collections.  There is a suggestion of thickening of the wall of the rectum although  this may be due to nondistension.  Infiltration in the perirectal fat. Changes may represent proctitis or other inflammatory process. Follow-up evaluation of the rectum is recommended to exclude an occult mass lesion.  Degenerative changes in the lumbar spine. Spondylolysis and mild spondylolisthesis at L5-S1.  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  Suggestion of thickening of the wall of the rectum with stranding in the perirectal fat.  This is most likely representing inflammatory process such as proctitis although follow-up evaluation of the rectum is recommended to exclude occult mass in the nondistended rectum.  Original Report Authenticated By: Marlon Pel, M.D.   Dg Abd 2 Views 08/16/2011  *RADIOLOGY REPORT*  Clinical Data: Abdominal pain and vomiting for 24 hours.  ABDOMEN - 2 VIEW  Comparison: 06/29/2011  Findings: Scattered gas and stool throughout the colon.  No small or large bowel distension.  No free intra-abdominal air.  No abnormal air fluid levels.  Surgical clips in the right upper quadrant.  Vascular calcifications.  Degenerative changes  in the lumbar spine and hips.  No radiopaque stones.  No significant changes since the previous study.  IMPRESSION: Nonobstructive bowel gas pattern.  Original Report Authenticated By: Marlon Pel, M.D.    Scheduled Meds:   . ALPRAZolam  0.5 mg Oral TID  . atorvastatin  20 mg Oral q1800  . ciprofloxacin  400 mg Intravenous BID  . darifenacin  7.5 mg Oral Daily  . docusate sodium  200 mg Oral BID  . escitalopram  20 mg Oral Daily  . insulin aspart  0-5 Units Subcutaneous QHS  . insulin aspart  0-9 Units Subcutaneous TID WC  . insulin aspart  3 Units Subcutaneous TID AC  . insulin glargine  31 Units Subcutaneous QHS  . levothyroxine  50 mcg Oral Q breakfast  . loratadine  10 mg Oral Daily  . metoprolol  50 mg Oral BID  . metronidazole  500 mg Intravenous Q8H  . multivitamin with minerals  1 tablet Oral  Daily  . omega-3 acid ethyl esters  1 g Oral QID  . pantoprazole  40 mg Oral Q1200  . polyethylene glycol  17 g Oral BID  . potassium chloride SA  20 mEq Oral Q lunch  . sodium phosphate  2 enema Rectal Once  . sodium phosphate      . DISCONTD: metronidazole  500 mg Intravenous Q8H   Continuous Infusions:   . 0.9 % NaCl with KCl 40 mEq / L 100 mL/hr at 08/18/11 4098

## 2011-08-18 NOTE — Op Note (Signed)
[facility] [address] [place], [place]  [address]  OPERATIVE PROCEDURE REPORT  PATIENT:  Sarah Skinner, Sarah Skinner  MR#:  [id number] BIRTHDATE:  1940/04/12  GENDER:  female ENDOSCOPIST:  Dr. Lorenza Burton, MD ASSISTANT:  Angelique Blonder and Kandice Robinsons, technician.  PROCEDURE DATE:  08/18/2011 PRE-PROCEDURE PREPERATION:  Fleets enemas were used for bowel prep. She was fasted for 4 hours prior to the procedure. PRE-PROCEDURE PHYSICAL:  Patient has stable vital signs. Neck is supple. There is no JVD, thyromegaly or LAD. Chest clear to auscultation. S1 and S2 regular. Abdomen soft, obese, non-distended, diffusely tender with NABS. PROCEDURE:  Flexible sigmoidoscopy with multiple cold biopsies. ASA CLASS:  Class IV INDICATIONS:  1) Rectal bleeding  2) Constipation. MEDICATIONS:  Fentanyl 25 mcg & Versed 3 mg IV.  DESCRIPTION OF PROCEDURE: After the risks, benefits, and alternatives of the procedure were thoroughly explained [including a 10% missed rate of cancer and polyps], informed consent was obtained. Digital rectal exam was performed. The Pentax pediatric colonoscope [id number] was introduced through the anus and advanced to the sigmoid colon, without limitations.  The quality of the prep was poor.. Multiple washes were done. Small lesions could be missed. The instrument was then slowly withdrawn as the colon was fully examined. <<PROCEDUREIMAGES>>  FINDINGS:  The rectum appeared normal with no evidence of proctitis. There was ischemic colitis with mucosl edema, nodularity and erthema noted from 10-20 cm. Beyond that there was solid stool and therefore I could not advance the scope beyond 20 cm. Multiple cold biopsies were done from the rectosigmoid colon. No masses, polyps, diverticula or AVM's were noted. The patient tolerated the procedure without immediate complications.  The scope was then withdrawn from the patient and the procedure terminated.  IMPRESSION:   Ischemic colitis from 10-20 cm-biopsies done; no evidence of proctitis.  RECOMMENDATIONS:  1) Await pathology results. 2) Avoid all NSAIDS for the now. 3) Scheduled Colace 200 mg BID PO. 4) Miralax 17 gms TID PO.  REPEAT EXAM:  None planned for now.  DISCHARGE INSTRUCTIONS:  Standard discharge instructions given.  ______________________________ Dr. Lorenza Burton, MD  CPT CODES: 47829  DIAGNOSIS CODES:  569.3, 564.00, 793.4  CC:  THP  n. eSIGNED:   Dr. Lorenza Burton at 08/18/2011 03:39 PM  Lurene Shadow, [phone]

## 2011-08-18 NOTE — Progress Notes (Signed)
2 fleet's enemas administered per orders.  Patient had no feces expelled after 1st enema, and trace amount of formed, yellowish stool with second enema.

## 2011-08-18 NOTE — Progress Notes (Signed)
PT Cancellation Note  Treatment cancelled today due to patient's refusal to participate. Pt does not feel well and is anticipating a procedure this afternoon. Pt refuses PT  Bayard Hugger. [name], PT 08/18/2011, 11:05 AM

## 2011-08-19 ENCOUNTER — Encounter (HOSPITAL_COMMUNITY): Payer: Self-pay | Admitting: Gastroenterology

## 2011-08-19 DIAGNOSIS — D7289 Other specified disorders of white blood cells: Secondary | ICD-10-CM

## 2011-08-19 DIAGNOSIS — K6289 Other specified diseases of anus and rectum: Secondary | ICD-10-CM

## 2011-08-19 DIAGNOSIS — K625 Hemorrhage of anus and rectum: Secondary | ICD-10-CM

## 2011-08-19 DIAGNOSIS — E1165 Type 2 diabetes mellitus with hyperglycemia: Secondary | ICD-10-CM

## 2011-08-19 LAB — CBC
HCT: 37.2 % (ref 36.0–46.0)
Hemoglobin: 12.7 g/dL (ref 12.0–15.0)
MCHC: 34.1 g/dL (ref 30.0–36.0)
MCV: 89.9 fL (ref 78.0–100.0)

## 2011-08-19 LAB — BASIC METABOLIC PANEL
BUN: 9 mg/dL (ref 6–23)
Chloride: 99 mEq/L (ref 96–112)
Creatinine, Ser: 0.76 mg/dL (ref 0.50–1.10)
GFR calc non Af Amer: 83 mL/min — ABNORMAL LOW (ref >90)
Glucose, Bld: 138 mg/dL — ABNORMAL HIGH (ref 70–99)
Potassium: 3.4 mEq/L — ABNORMAL LOW (ref 3.5–5.1)

## 2011-08-19 LAB — GLUCOSE, CAPILLARY: Glucose-Capillary: 100 mg/dL — ABNORMAL HIGH (ref 70–99)

## 2011-08-19 MED ORDER — POTASSIUM CHLORIDE CRYS ER 20 MEQ PO TBCR
40.0000 meq | EXTENDED_RELEASE_TABLET | Freq: Once | ORAL | Status: AC
  Administered 2011-08-19: 40 meq via ORAL
  Filled 2011-08-19: qty 2

## 2011-08-19 NOTE — Progress Notes (Signed)
C diff negative, pt removed from precautions.

## 2011-08-19 NOTE — Progress Notes (Addendum)
[facility] PROGRESS NOTE  LARAMIE GELLES ZOX:[id number] DOB: 1940-12-16 DOA: 08/16/2011 PCP: Redmond Baseman, MD  Assessment/Plan:  Principal Problem:  *Proctitis with rectal bleeding, nausea and vomiting  CT scan reviewed, consistent with proctitis, possibly related to recent enema administration.  Concurrent leukocytosis concerning for infection - WBC trending down  Continue IV antibiotics with Cipro and Flagyl.  Clostridium difficile PCR negative  Active Problems:  DM (diabetes mellitus)  Continue home regimen of Lantus and NovoLog. Add sliding scale.  Check hemoglobin A1c- pending  Hypertension  Continue home antihypertensives and monitor her blood pressure.  Depression with anxiety  Continue Lexapro and Xanax.  Fibromyalgia  The patient endorses chronic pain from fibromyalgia. Hold Voltaren and nonsteroidal anti-inflammatories given history of rectal bleeding.  Use Vicodin for pain control.  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  PT/OT evaluations requested.  Control modifiable risk factors.  Hypokalemia  Likely secondary to GI losses.  Will supplement  Fatty liver with probable cirrhosis by CT scan  Check liver function studies to determine synthetic capacity of the liver   Chronic constipation  Stool seen throughout the colon on imaging studies. continue Colace and add twice a day MiraLAX. Would avoid further enemas for now.  Leukocytosis  Likely from colitis. Treating with antibiotics.  Hyponatremia  Likely from dehydration. Hydrate and monitor.  Hypothyroidism  Check TSH to determine if she is appropriately treated given her complaints of heat intolerance.  GERD (gastroesophageal reflux disease)  Continue PPI therapy.  Stress incontinence  Continue Vesicare.  Prophylaxis: SCDs for DVT prophylaxis given rectal bleeding.   Code Status: full code  Family Communication: updated  at bedside  Disposition Plan: home when stable, per patient's daughter [name] they would prefer to have patient go home with home health PT; anticipate discharge tomorrow am   [name], MD  [facility] Pager [phone]  If 7PM-7AM, please contact night-coverage [email or web address] Password [email or web address] 08/19/2011, 10:10 AM   LOS: 3 days   Consultants:  GI - Dr. Loreta Ave  Procedures:  Endoscopy - Dr. Loreta Ave 08/18/2011 IMPRESSION: Ischemic colitis from 10-20 cm-biopsies done; no evidence of proctitis.  RECOMMENDATIONS:  1) Await pathology results.  2) Avoid all NSAIDS for the now.  3) Scheduled Colace 200 mg BID PO.  4) Miralax 17 gms TID PO.  Antibiotics:  Ciprofloxacin 08/17/2011 -->  Flagyl 08/17/2011 -->  HPI/Subjective: No events overnight.   Objective: Filed Vitals:   08/18/11 1540 08/18/11 1550 08/18/11 2124 08/19/11 0547  BP: 156/85 154/89 179/80 167/72  Pulse:   91 70  Temp:   99.5 F (37.5 C) 98.7 F (37.1 C)  TempSrc:   Oral Oral  Resp: 14 13 20 16   Height:      Weight:      SpO2: 96% 96% 95% 96%    Intake/Output Summary (Last 24 hours) at 08/19/11 1010 Last data filed at 08/19/11 0630  Gross per 24 hour  Intake    700 ml  Output      0 ml  Net    700 ml    Exam:   General:  Pt is alert, follows commands appropriately, not in acute distress  Cardiovascular: Regular rate and rhythm, S1/S2, no murmurs, no rubs, no gallops  Respiratory: Clear to auscultation bilaterally, no wheezing, no crackles, no rhonchi  Abdomen: Soft, non tender, non distended, bowel sounds present, no guarding  Extremities: No edema, pulses DP and PT palpable bilaterally  Data Reviewed: Basic Metabolic  Panel:  Lab 08/19/11 0905 08/18/11 0335 08/17/11 0330  NA 133* 127* 130*  K 3.4* 3.5 3.2*  CL 99 93* 90*  CO2 26 26 26   GLUCOSE 138* 123* 215*  BUN 9 9 8   CREATININE 0.76 0.83 0.65  CALCIUM 8.6 8.7 9.2   Liver Function Tests:  Lab 08/17/11 0935  AST 27    ALT 51*  ALKPHOS 74  BILITOT 0.7  PROT 6.9  ALBUMIN 3.5    Lab 08/17/11 0748  LIPASE 12  AMYLASE --   CBC:  Lab 08/19/11 0905 08/18/11 0335 08/16/11 2146  WBC 9.9 14.2* 22.5*  NEUTROABS -- -- 19.1*  HGB 12.7 12.8 14.6  HCT 37.2 37.6 41.9  MCV 89.9 88.7 88.4  PLT 186 203 223   CBG:  Lab 08/19/11 0838 08/18/11 2209 08/18/11 1722 08/18/11 1228 08/18/11 0735  GLUCAP 116* 149* 96 127* 123*    Recent Results (from the past 240 hour(s))  URINE CULTURE     Status: Normal   Collection Time   08/16/11  9:58 PM      Component Value Range Status Comment   Specimen Description URINE, CLEAN CATCH   Final    Special Requests NONE   Final    Culture  Setup Time [id number]   Final    Colony Count NO GROWTH   Final    Culture NO GROWTH   Final    Report Status 08/17/2011 FINAL   Final   CLOSTRIDIUM DIFFICILE BY PCR     Status: Normal   Collection Time   08/18/11  1:42 PM      Component Value Range Status Comment   C difficile by pcr NEGATIVE  NEGATIVE Final      Studies: No results found.  Scheduled Meds:   . ALPRAZolam  0.5 mg Oral TID  . atorvastatin  20 mg Oral q1800  . ciprofloxacin  400 mg Intravenous BID  . darifenacin  7.5 mg Oral Daily  . docusate sodium  200 mg Oral BID  . escitalopram  20 mg Oral Daily  . insulin aspart  0-5 Units Subcutaneous QHS  . insulin aspart  0-9 Units Subcutaneous TID WC  . insulin aspart  3 Units Subcutaneous TID AC  . insulin glargine  31 Units Subcutaneous QHS  . levothyroxine  50 mcg Oral Q breakfast  . loratadine  10 mg Oral Daily  . metoprolol  50 mg Oral BID  . metronidazole  500 mg Intravenous Q8H  . multivitamin with minerals  1 tablet Oral Daily  . omega-3 acid ethyl esters  1 g Oral QID  . pantoprazole  40 mg Oral Q1200  . polyethylene glycol  17 g Oral BID  . potassium chloride SA  20 mEq Oral Q lunch  . sodium phosphate  2 enema Rectal Once  . sodium phosphate       Continuous Infusions:   . DISCONTD: 0.9 % NaCl  with KCl 40 mEq / L 100 mL/hr at 08/18/11 8119

## 2011-08-19 NOTE — Evaluation (Signed)
Physical Therapy Evaluation Patient Details Name: Sarah Skinner MRN: [id number] DOB: April 30, 1940 Today's Date: 08/19/2011 Time: 4098-1191 PT Time Calculation (min): 17 min  PT Assessment / Plan / Recommendation Clinical Impression  71 yo female admitted wtih proctitis with rectal bleeding, N/V. Pt is from home with husband. Near total care for ADLs/mobility. Husband "lifts" pt into chair per pt. Pt states "he is my therapy." Recommend HHPT and 24 hour supervision/assist as long as husband is able to continue to care for pt at home. If not, then may need to consider SNF.     PT Assessment  Patient needs continued PT services    Follow Up Recommendations  Home health PT;Supervision/Assistance - 24 hour  (SNF if husband is unable to continue to care for pt)    Barriers to Discharge        Equipment Recommendations  None recommended by PT    Recommendations for Other Services OT consult   Frequency Min 3X/week    Precautions / Restrictions Precautions Precautions: Fall Precaution Comments: L side hemiparesis. Restrictions Weight Bearing Restrictions: No   Pertinent Vitals/Pain       Mobility  Bed Mobility Bed Mobility: Rolling Left;Left Sidelying to Sit Rolling Left: 4: Min assist Left Sidelying to Sit: 2: Max assist Details for Bed Mobility Assistance: Assist for bil LEs off bed and trunk to upright. VCs safety, technique, hand placement. Transfers Transfers: Sit to Stand;Stand to Sit;Squat Pivot Transfers;Stand Pivot Transfers Sit to Stand: 1: +2 Total assist;From chair/3-in-1;With upper extremity assist;With armrests Sit to Stand: Patient Percentage: 40% Stand to Sit: 1: +2 Total assist;To chair/3-in-1;With upper extremity assist;With armrests Stand Pivot Transfers: 1: +2 Total assist Stand Pivot Transfers: Patient Percentage: 40% Squat Pivot Transfers: 1: +2 Total assist Squat Pivot Transfers: Patient Percentage: 60% Details for Transfer Assistance: VCs safety,  technique, handplacemet. Assist to rise, stabilize, control descent. Pt able to stand for 10-15 seconds with RW support. Assist to keep L hand on walker grip. Squat pivot x 1 (bed to recliner), Stand pivot x 1 (recliner to bed). Ambulation/Gait Ambulation/Gait Assistance: Not tested (comment)    Exercises     PT Diagnosis: Generalized weakness (hemiparesis L side)  PT Problem List: Decreased strength;Decreased mobility;Decreased balance;Decreased activity tolerance;Decreased knowledge of use of DME;Pain PT Treatment Interventions: Functional mobility training;Therapeutic activities;Therapeutic exercise;Balance training;Patient/family education   PT Goals Acute Rehab PT Goals PT Goal Formulation: With patient Time For Goal Achievement: 09/02/11 Potential to Achieve Goals: Fair Pt will Roll Supine to Right Side: with min assist PT Goal: Rolling Supine to Right Side - Progress: Goal set today Pt will Roll Supine to Left Side: with min assist PT Goal: Rolling Supine to Left Side - Progress: Goal set today Pt will go Supine/Side to Sit: with mod assist PT Goal: Supine/Side to Sit - Progress: Goal set today Pt will go Sit to Supine/Side: with mod assist PT Goal: Sit to Supine/Side - Progress: Goal set today Pt will Transfer Bed to Chair/Chair to Bed: with max assist PT Transfer Goal: Bed to Chair/Chair to Bed - Progress: Goal set today  Visit Information  Last PT Received On: 08/19/11 Assistance Needed: +2    Subjective Data  Subjective: "Im tired. I didn't sleep any last night" Patient Stated Goal: Home   Prior Functioning  Home Living Lives With: Spouse Available Help at Discharge:  ([facility]) Home Adaptive Equipment: Hospital bed;Wheelchair - manual;Bedside commode/3-in-1 Prior Function Level of Independence: Needs assistance Needs Assistance: Transfers;Bathing;Dressing;Feeding;Grooming;Toileting Transfer Assistance: Transfers only.  Pt states husband picks her  up/lifts patient into chair Communication Communication: No difficulties    Cognition  Overall Cognitive Status: Appears within functional limits for tasks assessed/performed Arousal/Alertness: Awake/alert Orientation Level: Appears intact for tasks assessed Behavior During Session: [facility] for tasks performed    Extremity/Trunk Assessment Right Lower Extremity Assessment RLE ROM/Strength/Tone: Deficits RLE ROM/Strength/Tone Deficits: Strength at least 4/5 throughout RLE Sensation: WFL - Light Touch RLE Coordination: WFL - gross motor Left Lower Extremity Assessment LLE ROM/Strength/Tone: Deficits LLE ROM/Strength/Tone Deficits: Strength at leat 2/5 throughout   Balance Balance Balance Assessed: Yes Static Sitting Balance Static Sitting - Balance Support: Feet supported;Bilateral upper extremity supported Static Sitting - Level of Assistance: 5: Stand by assistance  End of Session PT - End of Session Equipment Utilized During Treatment: Gait belt Activity Tolerance: Patient tolerated treatment well Patient left: in bed;with call bell/phone within reach  GP     Rebeca Alert [facility] 08/19/2011, 11:46 AM [phone]

## 2011-08-20 DIAGNOSIS — K625 Hemorrhage of anus and rectum: Secondary | ICD-10-CM

## 2011-08-20 DIAGNOSIS — D7289 Other specified disorders of white blood cells: Secondary | ICD-10-CM

## 2011-08-20 DIAGNOSIS — K6289 Other specified diseases of anus and rectum: Secondary | ICD-10-CM

## 2011-08-20 DIAGNOSIS — E1165 Type 2 diabetes mellitus with hyperglycemia: Secondary | ICD-10-CM

## 2011-08-20 LAB — GLUCOSE, CAPILLARY: Glucose-Capillary: 133 mg/dL — ABNORMAL HIGH (ref 70–99)

## 2011-08-20 LAB — BASIC METABOLIC PANEL
BUN: 9 mg/dL (ref 6–23)
Chloride: 98 mEq/L (ref 96–112)
Glucose, Bld: 119 mg/dL — ABNORMAL HIGH (ref 70–99)
Potassium: 3.3 mEq/L — ABNORMAL LOW (ref 3.5–5.1)

## 2011-08-20 LAB — CBC
HCT: 40.7 % (ref 36.0–46.0)
Hemoglobin: 13.6 g/dL (ref 12.0–15.0)
MCV: 90 fL (ref 78.0–100.0)
WBC: 11.8 10*3/uL — ABNORMAL HIGH (ref 4.0–10.5)

## 2011-08-20 MED ORDER — POLYETHYLENE GLYCOL 3350 17 G PO PACK: 17.0000 g | PACK | Freq: Two times a day (BID) | ORAL | Status: AC

## 2011-08-20 MED ORDER — POTASSIUM CHLORIDE CRYS ER 20 MEQ PO TBCR
40.0000 meq | EXTENDED_RELEASE_TABLET | Freq: Once | ORAL | Status: AC
  Administered 2011-08-20: 40 meq via ORAL
  Filled 2011-08-20: qty 2

## 2011-08-20 MED ORDER — DOCUSATE SODIUM 100 MG PO CAPS: 200.0000 mg | ORAL_CAPSULE | Freq: Two times a day (BID) | ORAL | Status: DC

## 2011-08-20 NOTE — Discharge Instructions (Signed)
Colitis Colitis is inflammation of the colon. Colitis can be a short-term or long-standing (chronic) illness. Crohn's disease and ulcerative colitis are 2 types of colitis which are chronic. They usually require lifelong treatment. CAUSES  There are many different causes of colitis, including:  Viruses.   Germs (bacteria).   Medicine reactions.  SYMPTOMS   Diarrhea.   Intestinal bleeding.   Pain.   Fever.   Throwing up (vomiting).   Tiredness (fatigue).   Weight loss.   Bowel blockage.  DIAGNOSIS  The diagnosis of colitis is based on examination and stool or blood tests. X-rays, CT scan, and colonoscopy may also be needed. TREATMENT  Treatment may include:  Fluids given through the vein (intravenously).   Bowel rest (nothing to eat or drink for a period of time).   Medicine for pain and diarrhea.   Medicines (antibiotics) that kill germs.   Cortisone medicines.   Surgery.  HOME CARE INSTRUCTIONS   Get plenty of rest.   Drink enough water and fluids to keep your urine clear or pale yellow.   Eat a well-balanced diet.   Call your caregiver for follow-up as recommended.  SEEK IMMEDIATE MEDICAL CARE IF:   You develop chills.   You have an oral temperature above 102 F (38.9 C), not controlled by medicine.   You have extreme weakness, fainting, or dehydration.   You have repeated vomiting.   You develop severe belly (abdominal) pain or are passing bloody or tarry stools.  MAKE SURE YOU:   Understand these instructions.   Will watch your condition.   Will get help right away if you are not doing well or get worse.  Document Released: 03/18/2004 Document Revised: 01/28/2011 Document Reviewed: 06/13/2009 [employer] Patient Information 2012 [employer]. 

## 2011-08-20 NOTE — Discharge Summary (Signed)
Physician Discharge Summary  Sarah Skinner:[id number] DOB: [birth year] DOA: 08/16/2011  PCP: Redmond Baseman, MD  Admit date: 08/16/2011 Discharge date: 08/20/2011  Discharge Condition: medically stable for discharge home today; per family and patient they declined SNF placement and wanted HHPT (order placed)  Diet recommendation: diabetic diet  History of present illness:   71 y.o. female with a PMH of GERD, DM, HTN who presented to the hospital with a chief complaint of abdominal cramping and bright red blood per rectum that has developed over the past 24 hours. The abdominal pain is somewhat chronic. The patient also has had nausea and vomiting that began last night. No hematemesis. Tried to take bland foods yesterday, including yogurt, which she was not able to keep down. No sick contacts. Had a colonoscopy 5-6 years ago, done by Dr. Loreta Ave, and was told her colon was "black". The patient endorses chronic constipation and used an OTC enema yesterday resulting in loose stools. Takes Ibuprofen as needed for pain, but does not take every day (once or twice a week).   Assessment/Plan:   Principal Problem:  *Proctitis with rectal bleeding, nausea and vomiting  CT scan reviewed, consistent with proctitis, possibly related to recent enema administration.  WBC trending down  Since no evidence of proctitis will discontinue antibiotics at this time; no need for Cipro or flagyl upon discharge  Clostridium difficile PCR negative  Active Problems:  DM (diabetes mellitus)  Continue home regimen of Lantus and NovoLog. Add sliding scale.  Check hemoglobin A1c- 6.8  Hypertension  Continue home antihypertensives and monitor her blood pressure.  Depression with anxiety  Continue Lexapro and Xanax.  Fibromyalgia  The patient endorses chronic pain from fibromyalgia. Hold Voltaren and nonsteroidal anti-inflammatories given history of rectal bleeding.  Use Vicodin for pain control.  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  PT/OT evaluations requested. Recommended SNF but family and patient declined it Control modifiable risk factors.  Hypokalemia  Likely secondary to GI losses.  Will supplement today and patient can continue taking potassium supplements per home regimen  Fatty liver with probable cirrhosis by CT scan  Check liver function studies to determine synthetic capacity of the liver - only mild elevation in ALT 51 otherwise within normal limits  Chronic constipation  Stool seen throughout the colon on imaging studies. continue Colace and added TID  MiraLAX. Would avoid further enemas for now.  Leukocytosis  Likely from colitis. No evidence of proctitis on colonoscopy so will d/c antibiotics for now  Hyponatremia  Likely from dehydration.  Stable at 133  Hypothyroidism  TSH within normal limits  GERD (gastroesophageal reflux disease)  Continue PPI therapy.  Stress incontinence  Continue Vesicare.  Prophylaxis: SCDs for DVT prophylaxis given rectal bleeding while pt was in hospital  Code Status: full code  Family Communication: updated at bedside  Disposition Plan: home today  Consultants:  GI - Dr. Loreta Ave  Procedures:  Endoscopy - Dr. 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   RECOMMENDATIONS:  1) Await pathology results.  2) Avoid all NSAIDS for the now.  3) Scheduled Colace 200 mg BID PO.  4) Miralax 17 gms TID PO.   Antibiotics:  Ciprofloxacin 08/17/2011 --> 08/20/2011  Flagyl 08/17/2011 -->08/20/2011  [name], MD  [facility]  Pager [phone]   If 7PM-7AM, please contact night-coverage  [email or web address]  Password [email or web address]  08/19/2011, 10:10 AM  LOS: 3 days  Discharge Exam: Filed Vitals:   08/20/11 0604  BP: 174/79  Pulse: 69  Temp: 98.7 F (37.1 C)  Resp: 18   Filed Vitals:   08/19/11 1457  08/19/11 2140 08/19/11 2141 08/20/11 0604  BP: 174/82 183/79 172/84 174/79  Pulse: 68 64  69  Temp: 98.2 F (36.8 C) 99 F (37.2 C)  98.7 F (37.1 C)  TempSrc: Oral Oral  Oral  Resp: 22 20  18   Height:      Weight:      SpO2: 97% 98%  97%    General: Pt is alert, follows commands appropriately, not in acute distress Cardiovascular: Regular rate and rhythm, S1/S2 +, no murmurs, no rubs, no gallops Respiratory: Clear to auscultation bilaterally, no wheezing, no crackles, no rhonchi Abdominal: Soft, non tender, non distended, bowel sounds +, no guarding Extremities: no edema, no cyanosis, pulses palpable bilaterally DP and PT Neuro: Grossly nonfocal  Discharge Instructions  Discharge Orders    Future Orders Please Complete By Expires   Diet - low sodium heart healthy      Increase activity slowly      Call MD for:  persistant nausea and vomiting      Call MD for:  severe uncontrolled pain      Call MD for:  difficulty breathing, headache or visual disturbances      Call MD for:  persistant dizziness or light-headedness        Medication List  As of 08/20/2011  1:27 PM   TAKE these medications         ALPRAZolam 0.5 MG tablet   Commonly known as: XANAX   Take 0.5 mg by mouth 3 (three) times daily.      diclofenac 50 MG EC tablet   Commonly known as: VOLTAREN   Take 50 mg by mouth 2 (two) times daily.      docusate sodium 100 MG capsule   Commonly known as: COLACE   Take 2 capsules (200 mg total) by mouth 2 (two) times daily.      escitalopram 20 MG tablet   Commonly known as: LEXAPRO   Take 20 mg by mouth every morning.      Fish Oil 1000 MG Caps   Take 1 capsule by mouth 4 (four) times daily.      insulin aspart 100 UNIT/ML injection   Commonly known as: novoLOG   Inject 3 Units into the skin 3 (three) times daily before meals. Injects 3 units every morning, dinner (noon), and supper.      insulin glargine 100 UNIT/ML injection   Commonly known as: LANTUS    Inject 31 Units into the skin at bedtime.      levothyroxine 50 MCG tablet   Commonly known as: SYNTHROID, LEVOTHROID   Take 50 mcg by mouth every morning.      loratadine 10 MG tablet   Commonly known as: CLARITIN   Take 10 mg by mouth daily.      metoprolol 50 MG tablet   Commonly known as: LOPRESSOR   Take 50 mg by mouth 2 (two) times daily.      MULTIVITAMIN & MINERAL PO   Take 1 tablet by mouth daily. Multivitamin with calcium 1000mg , magnesium 400mg  and zinc 15 mg per daughter. Patient takes at nite      multivitamin tablet   Take 1 tablet by mouth daily with lunch.      pantoprazole 40 MG tablet   Commonly known as: PROTONIX  Take 40 mg by mouth every morning.      MIRALAX PO   Take 17 g by mouth every morning.      polyethylene glycol packet   Commonly known as: MIRALAX / GLYCOLAX   Take 17 g by mouth 3 times daily.      potassium chloride SA 20 MEQ tablet   Commonly known as: K-DUR,KLOR-CON   Take 20 mEq by mouth daily with lunch.      rosuvastatin 10 MG tablet   Commonly known as: CRESTOR   Take 10 mg by mouth. Pt takes every other nite      solifenacin 5 MG tablet   Commonly known as: VESICARE   Take 5 mg by mouth every morning.      vitamin C 500 MG tablet   Commonly known as: ASCORBIC ACID   Take 500 mg by mouth every morning.      vitamin E 400 UNIT capsule   Take 400 Units by mouth every morning.      zolpidem 5 MG tablet   Commonly known as: AMBIEN   Take 5 mg by mouth at bedtime.           Follow-up Information    Follow up with Redmond Baseman, MD in 1 week.   Contact information:   [address] [place] [address] [phone]           The results of significant diagnostics from this hospitalization (including imaging, microbiology, ancillary and laboratory) are listed below for reference.    Significant Diagnostic Studies: Ct Abdomen Pelvis W Contrast 08/17/2011     IMPRESSION: Probable cirrhosis and fatty infiltration in the liver.  Suggestion of thickening of the wall of the rectum with stranding in the perirectal fat.  This is most likely representing inflammatory process such as proctitis although follow-up evaluation of the rectum is recommended to exclude occult mass in the nondistended rectum.    Dg Abd 2 Views 08/16/2011    IMPRESSION: Nonobstructive bowel gas pattern.    Microbiology: Recent Results (from the past 240 hour(s))  URINE CULTURE     Status: Normal   Collection Time   08/16/11  9:58 PM      Component Value Range Status Comment   Specimen Description URINE, CLEAN CATCH   Final    Special Requests NONE   Final    Culture  Setup Time [id number]   Final    Colony Count NO GROWTH   Final    Culture NO GROWTH   Final    Report Status 08/17/2011 FINAL   Final   CLOSTRIDIUM DIFFICILE BY PCR     Status: Normal   Collection Time   08/18/11  1:42 PM      Component Value Range Status Comment   C difficile by pcr NEGATIVE  NEGATIVE Final      Labs: Basic Metabolic Panel:  Lab 08/20/11 6295 08/19/11 0905 08/18/11 0335 08/17/11 0330  NA 133* 133* 127* 130*  K 3.3* 3.4* 3.5 3.2*  CL 98 99 93* 90*  CO2 27 26 26 26   GLUCOSE 119* 138* 123* 215*  BUN 9 9 9 8   CREATININE 0.82 0.76 0.83 0.65  CALCIUM 8.8 8.6 8.7 9.2   Liver Function Tests:  Lab 08/17/11 0935  AST 27  ALT 51*  ALKPHOS 74  BILITOT 0.7  PROT 6.9  ALBUMIN 3.5    Lab 08/17/11 0748  LIPASE 12  AMYLASE --  CBC:  Lab 08/20/11 0330 08/19/11 0905 08/18/11 0335 08/16/11 2146  WBC 11.8* 9.9 14.2* 22.5*  HGB 13.6 12.7 12.8 14.6  HCT 40.7 37.2 37.6 41.9  MCV 90.0 89.9 88.7 88.4  PLT 187 186 203 223   CBG:  Lab 08/20/11 1304 08/20/11 0722 08/19/11 2149 08/19/11 1708 08/19/11 1223  GLUCAP 176* 133* 100* 96 138*    Time coordinating discharge: Over 30 minutes  Signed:  [name], MD  [facility] 08/20/2011, 1:27 PM  Pager #:  [phone]

## 2012-01-27 ENCOUNTER — Emergency Department (HOSPITAL_COMMUNITY): Payer: PRIVATE HEALTH INSURANCE

## 2012-01-27 ENCOUNTER — Observation Stay (HOSPITAL_COMMUNITY)
Admission: EM | Admit: 2012-01-27 | Discharge: 2012-01-28 | DRG: 206 | Disposition: A | Discharge status: Home / Self Care | Payer: PRIVATE HEALTH INSURANCE | Attending: Pulmonary Disease | Admitting: Pulmonary Disease

## 2012-01-27 ENCOUNTER — Encounter (HOSPITAL_COMMUNITY)
Admission: EM | Disposition: A | Discharge status: Home / Self Care | Payer: Self-pay | Source: Home / Self Care | Attending: Pulmonary Disease

## 2012-01-27 ENCOUNTER — Encounter (HOSPITAL_COMMUNITY): Payer: Self-pay | Admitting: *Deleted

## 2012-01-27 DIAGNOSIS — Z794 Long term (current) use of insulin: Secondary | ICD-10-CM

## 2012-01-27 DIAGNOSIS — Z880 Allergy status to penicillin: Secondary | ICD-10-CM

## 2012-01-27 DIAGNOSIS — I10 Essential (primary) hypertension: Secondary | ICD-10-CM

## 2012-01-27 DIAGNOSIS — I639 Cerebral infarction, unspecified: Secondary | ICD-10-CM

## 2012-01-27 DIAGNOSIS — F418 Other specified anxiety disorders: Secondary | ICD-10-CM

## 2012-01-27 DIAGNOSIS — E039 Hypothyroidism, unspecified: Secondary | ICD-10-CM

## 2012-01-27 DIAGNOSIS — E871 Hypo-osmolality and hyponatremia: Secondary | ICD-10-CM

## 2012-01-27 DIAGNOSIS — K6289 Other specified diseases of anus and rectum: Secondary | ICD-10-CM

## 2012-01-27 DIAGNOSIS — Z79899 Other long term (current) drug therapy: Secondary | ICD-10-CM

## 2012-01-27 DIAGNOSIS — I6350 Cerebral infarction due to unspecified occlusion or stenosis of unspecified cerebral artery: Secondary | ICD-10-CM

## 2012-01-27 DIAGNOSIS — E119 Type 2 diabetes mellitus without complications: Secondary | ICD-10-CM

## 2012-01-27 DIAGNOSIS — D72829 Elevated white blood cell count, unspecified: Secondary | ICD-10-CM

## 2012-01-27 DIAGNOSIS — I69959 Hemiplegia and hemiparesis following unspecified cerebrovascular disease affecting unspecified side: Secondary | ICD-10-CM

## 2012-01-27 DIAGNOSIS — E876 Hypokalemia: Secondary | ICD-10-CM

## 2012-01-27 DIAGNOSIS — K5909 Other constipation: Secondary | ICD-10-CM

## 2012-01-27 DIAGNOSIS — IMO0001 Reserved for inherently not codable concepts without codable children: Secondary | ICD-10-CM | POA: Diagnosis present

## 2012-01-27 DIAGNOSIS — K219 Gastro-esophageal reflux disease without esophagitis: Secondary | ICD-10-CM

## 2012-01-27 DIAGNOSIS — K760 Fatty (change of) liver, not elsewhere classified: Secondary | ICD-10-CM

## 2012-01-27 DIAGNOSIS — F411 Generalized anxiety disorder: Secondary | ICD-10-CM | POA: Diagnosis present

## 2012-01-27 DIAGNOSIS — M797 Fibromyalgia: Secondary | ICD-10-CM

## 2012-01-27 DIAGNOSIS — K5792 Diverticulitis of intestine, part unspecified, without perforation or abscess without bleeding: Secondary | ICD-10-CM

## 2012-01-27 DIAGNOSIS — T17908A Unspecified foreign body in respiratory tract, part unspecified causing other injury, initial encounter: Secondary | ICD-10-CM | POA: Diagnosis present

## 2012-01-27 DIAGNOSIS — N393 Stress incontinence (female) (male): Secondary | ICD-10-CM

## 2012-01-27 DIAGNOSIS — T17508A Unspecified foreign body in bronchus causing other injury, initial encounter: Principal | ICD-10-CM | POA: Diagnosis present

## 2012-01-27 DIAGNOSIS — IMO0002 Reserved for concepts with insufficient information to code with codable children: Secondary | ICD-10-CM | POA: Diagnosis present

## 2012-01-27 LAB — GLUCOSE, CAPILLARY: Glucose-Capillary: 116 mg/dL — ABNORMAL HIGH (ref 70–99)

## 2012-01-27 SURGERY — BRONCHOSCOPY, FLEXIBLE
Anesthesia: Moderate Sedation

## 2012-01-27 MED ORDER — IPRATROPIUM BROMIDE 0.02 % IN SOLN
0.5000 mg | Freq: Once | RESPIRATORY_TRACT | Status: AC
  Administered 2012-01-27: 0.5 mg via RESPIRATORY_TRACT
  Filled 2012-01-27: qty 2.5

## 2012-01-27 MED ORDER — ALBUTEROL SULFATE (5 MG/ML) 0.5% IN NEBU
5.0000 mg | INHALATION_SOLUTION | Freq: Once | RESPIRATORY_TRACT | Status: AC
  Administered 2012-01-27: 5 mg via RESPIRATORY_TRACT
  Filled 2012-01-27: qty 1

## 2012-01-27 NOTE — ED Notes (Signed)
Pt c/o wantign to eat.  Ed md made aware.  Pt not to eat per ed md until admitting md sees her.  Pt daughter at bedside

## 2012-01-27 NOTE — ED Notes (Signed)
Pt arrives by [facility], EMS reports pt choked on a pea. Pt was being fed by a nursing aide when she choked on a single pea. Pt has had a stroke has left side deficits.

## 2012-01-27 NOTE — H&P (Signed)
PULMONARY  / CRITICAL CARE MEDICINE  Name: Sarah Skinner MRN: [id number] DOB: 05-25-1940    LOS: 0  REFERRING MD :  Billey Chang  CHIEF COMPLAINT:  Foreign body aspiration  BRIEF PATIENT DESCRIPTION: This is a 71 y/o female with a history of a prior stroke who presented to the [facility] ED on 12/05 after aspirating a pea at lunch.  She was admitted for bronchoscopy in the morning.    LINES / TUBES: Peripheral IV  CULTURES:   ANTIBIOTICS:   SIGNIFICANT EVENTS:    LEVEL OF CARE:  SDU PRIMARY SERVICE:  PCCM CONSULTANTS:   CODE STATUS: Full DIET:  Carb modified DVT Px:  scd GI Px:  ppi  HISTORY OF PRESENT ILLNESS:   This is a 71 y/o female with a history of a prior stroke who presented to the [facility] ED on 12/05 after aspirating a pea at lunch.  She was admitted for bronchoscopy in the morning.  She notes cough productive of clear sputum and wheezing.  She denies chest pain.  She does not typically choke on food or aspirate.   PAST MEDICAL HISTORY :  Past Medical History  Diagnosis Date  . Hypertension   . Diabetes mellitus   . Hypothyroidism   . Blood transfusion     hx of with childbirth   . Stress incontinence     stress incontinence   . GERD (gastroesophageal reflux disease)   . Headache     hx of   . Fibromyalgia   . Anxiety   . Arthritis     hands, feet , knees   . Stroke     left sided weakness   . Anginal pain     not currently seeing cardiologist   Past Surgical History  Procedure Date  . Abdominal hysterectomy   . Cholecystectomy   . Rectocele repair   . Cervical fusion   . Tubal ligation   . Wisdom tooth extraction   . Cystoscopy with injection 05/18/2011    Procedure: CYSTOSCOPY WITH INJECTION;  Surgeon: Anner Crete, MD;  Location: [facility] ORS;  Service: Urology;  Laterality: N/A;  Injection of Macroplastique  . Flexible sigmoidoscopy 08/18/2011    Procedure: FLEXIBLE SIGMOIDOSCOPY;  Surgeon: Charna Elizabeth, MD;  Location: [facility] ENDOSCOPY;  Service: Endoscopy;   Laterality: N/A;   Prior to Admission medications   Medication Sig Start Date End Date Taking? Authorizing Provider  ALPRAZolam Prudy Feeler) 0.5 MG tablet Take 0.5 mg by mouth 3 (three) times daily.    Yes Historical Provider, MD  cholecalciferol (VITAMIN D) 1000 UNITS tablet Take 2,000 Units by mouth daily.   Yes Historical Provider, MD  diclofenac (VOLTAREN) 50 MG EC tablet Take 50 mg by mouth 2 (two) times daily.    Yes Historical Provider, MD  docusate sodium (COLACE) 100 MG capsule Take 200 mg by mouth 2 (two) times daily. 08/20/11  Yes Alison Murray, MD  escitalopram (LEXAPRO) 20 MG tablet Take 20 mg by mouth every morning.    Yes Historical Provider, MD  insulin aspart (NOVOLOG) 100 UNIT/ML injection Inject 3 Units into the skin 3 (three) times daily before meals. Injects 3 units every morning, dinner (noon), and supper.   Yes Historical Provider, MD  insulin glargine (LANTUS) 100 UNIT/ML injection Inject 31 Units into the skin at bedtime.    Yes Historical Provider, MD  levothyroxine (SYNTHROID, LEVOTHROID) 50 MCG tablet Take 50 mcg by mouth every morning.    Yes Historical Provider, MD  loratadine (CLARITIN)  10 MG tablet Take 10 mg by mouth daily.    Yes Historical Provider, MD  metoprolol (LOPRESSOR) 50 MG tablet Take 50 mg by mouth 2 (two) times daily.    Yes Historical Provider, MD  mirabegron ER (MYRBETRIQ) 25 MG TB24 Take 25 mg by mouth daily.   Yes Historical Provider, MD  Multiple Vitamin (MULTIVITAMIN) tablet Take 1 tablet by mouth daily with lunch.    Yes Historical Provider, MD  Multiple Vitamins-Minerals (MULTIVITAMIN & MINERAL PO) Take 1 tablet by mouth daily. Multivitamin with calcium 1000mg , magnesium 400mg  and zinc 15 mg per daughter. Patient takes at nite   Yes Historical Provider, MD  Omega-3 Fatty Acids (FISH OIL) 1000 MG CAPS Take 1 capsule by mouth 4 (four) times daily.    Yes Historical Provider, MD  pantoprazole (PROTONIX) 40 MG tablet Take 40 mg by mouth every morning.     Yes Historical Provider, MD  potassium chloride SA (K-DUR,KLOR-CON) 20 MEQ tablet Take 20 mEq by mouth daily with lunch.    Yes Historical Provider, MD  rosuvastatin (CRESTOR) 10 MG tablet Take 10 mg by mouth. Pt takes every other nite   Yes Historical Provider, MD  solifenacin (VESICARE) 5 MG tablet Take 5 mg by mouth every morning.    Yes Historical Provider, MD  vitamin E 400 UNIT capsule Take 400 Units by mouth every morning.    Yes Historical Provider, MD  zolpidem (AMBIEN) 5 MG tablet Take 5 mg by mouth at bedtime.    Yes Historical Provider, MD   Allergies  Allergen Reactions  . Aspirin     Red splotches all over body   . Penicillins Hives    Red splotches all over body  . Sulfa Antibiotics Hives    Red splotches all over body  . Latex Rash    FAMILY HISTORY:  Family History  Problem Relation Age of Onset  . Heart failure Mother   . COPD Father   . Diabetes Sister   . Heart failure Sister    SOCIAL HISTORY:  reports that she quit smoking about 20 years ago. Her smoking use included Cigarettes. She has a 100 pack-year smoking history. She has never used smokeless tobacco. She reports that she does not drink alcohol or use illicit drugs.  REVIEW OF SYSTEMS:   Gen: Denies fever, chills, weight change, fatigue, night sweats HEENT: Denies blurred vision, double vision, hearing loss, tinnitus, sinus congestion, rhinorrhea, sore throat, neck stiffness, dysphagia PULM: per HPI Denies chest pain, edema, orthopnea, paroxysmal nocturnal dyspnea, palpitations GI: Denies abdominal pain, nausea, vomiting, diarrhea, hematochezia, melena, constipation, change in bowel habits GU: Denies dysuria, hematuria, polyuria, oliguria, urethral discharge Endocrine: Denies hot or cold intolerance, polyuria, polyphagia or appetite change Derm: Denies rash, dry skin, scaling or peeling skin change Heme: Denies easy bruising, bleeding, bleeding gums Neuro: Denies headache, numbness, weakness, slurred  speech, loss of memory or consciousness    INTERVAL HISTORY:   VITAL SIGNS: Temp:  [98.1 F (36.7 C)-98.6 F (37 C)] 98.6 F (37 C) (12/05 1449) Pulse Rate:  [79] 79  (12/05 1449) Resp:  [22] 22  (12/05 1449) BP: (154-157)/(66-68) 157/66 mmHg (12/05 1449) SpO2:  [95 %-98 %] 98 % (12/05 1659) HEMODYNAMICS:   VENTILATOR SETTINGS:   INTAKE / OUTPUT: Intake/Output    None     PHYSICAL EXAMINATION: Gen: chronically ill appearing, no acute distress HEENT: NCAT, PERRL, EOMi, OP clear, neck supple without masses PULM: Wheeze bilaterally R > L CV: RRR, no mgr,  no JVD AB: BS+, soft, nontender, no hsm Ext: warm, no edema, no clubbing, no cyanosis Derm: no rash or skin breakdown Neuro: A&Ox4, CN II-XII intact, strength 5/5 in R arm and leg, contractures and 0/5 strength L arm    LABS: Cbc No results found for this basename: WBC:,HGB:3,HCT:3,PLT:3 in the last 168 hours  Chemistry  No results found for this basename: NA:3,K:3,CL:3,CO2:3,BUN:3,CREATININE:3,CALCIUM:3,MG:3,PHOS:3,GLUCOSE:3 in the last 168 hours  Liver fxn No results found for this basename: AST:3,ALT:3,ALKPHOS:3,BILITOT:3,PROT:3,ALBUMIN:3 in the last 168 hours coags No results found for this basename: APTT:3,INR:3 in the last 168 hours Sepsis markers No results found for this basename: LATICACIDVEN:3,PROCALCITON:3 in the last 168 hours Cardiac markers No results found for this basename: CKTOTAL:3,CKMB:3,TROPONINI:3 in the last 168 hours BNP No results found for this basename: PROBNP:3 in the last 168 hours ABG No results found for this basename: PHART:3,PCO2ART:3,PO2ART:3,HCO3:3,TCO2:3 in the last 168 hours  CBG trend No results found for this basename: GLUCAP:5 in the last 168 hours  IMAGING: 12/05 CT chest >> spherical opacity distal bronchus intermedius  ECG:  DIAGNOSES: Principal Problem:  *Aspiration of foreign body in respiratory tract Active Problems:  DM2 (diabetes mellitus, type 2)  HTN  (hypertension)  CVA (cerebral infarction)   ASSESSMENT / PLAN:  PULMONARY  ASSESSMENT: 1) aspiration of foreign body (likely pea) into bronchus intermedius; no respiratory distress PLAN:   - bronchoscopy in bronch suite in AM - npo after midnight - prn duoneb for wheezing  CARDIOVASCULAR  ASSESSMENT:  1) HTN PLAN:  -continue home meds -EKG pre procedure  RENAL  ASSESSMENT:   1) No acute issues PLAN:   Check BMET  GASTROINTESTINAL  ASSESSMENT:   1) aspiration PLAN:   -speech eval in AM  HEMATOLOGIC  ASSESSMENT:   1) no acute issues PLAN:  -check pre-procedure CBC and PT/INR  INFECTIOUS  ASSESSMENT:   1) No acute issues PLAN:   -remove foreign body by bronch in AM 12/06; hold ABX as no evidence of pneumonia  ENDOCRINE  ASSESSMENT:   1) DM2 PLAN:   -home glargine, reg insulin with meals and ssi -diabetic diet  NEUROLOGIC  ASSESSMENT:   1) history of stroke, aspirin allergy but not on an antiplatelet agent?  PLAN:   -consider dipyridamole vs plavix vs ticlodipine post discharge for secondary stroke prevention  CLINICAL SUMMARY:  71 y/o female admitted to [facility] on 12/05 for aspiration of a foreign body.  Admitted for bronchoscopy in AM.  Max Fickle Pulmonary and Critical Care Medicine [facility] Pager: [phone]  01/27/2012, 7:44 PM

## 2012-01-27 NOTE — ED Provider Notes (Addendum)
History     CSN: [id number]  Arrival date & time 01/27/12  1339   First MD Initiated Contact with Patient 01/27/12 1522      Chief Complaint  Patient presents with  . Choking    resolved    HPI  The patient presents with concerns of choking on a pea.  She has multiple medical problems, including prior stroke with persistent left-sided deficits.  Just prior to calling EMS he was eating lunch, began choking on what she believes to have been a pea.  Since that time she has had persistent cough, no dyspnea, no CP.  No f/c. She presents with her daughter, who provides much of the HPI.  Past Medical History  Diagnosis Date  . Hypertension   . Diabetes mellitus   . Hypothyroidism   . Blood transfusion     hx of with childbirth   . Stress incontinence     stress incontinence   . GERD (gastroesophageal reflux disease)   . Headache     hx of   . Fibromyalgia   . Anxiety   . Arthritis     hands, feet , knees   . Stroke     left sided weakness   . Anginal pain     not currently seeing cardiologist    Past Surgical History  Procedure Date  . Abdominal hysterectomy   . Cholecystectomy   . Rectocele repair   . Cervical fusion   . Tubal ligation   . Wisdom tooth extraction   . Cystoscopy with injection 05/18/2011    Procedure: CYSTOSCOPY WITH INJECTION;  Surgeon: Anner Crete, MD;  Location: [facility];  Service: Urology;  Laterality: N/A;  Injection of Macroplastique  . Flexible sigmoidoscopy 08/18/2011    Procedure: FLEXIBLE SIGMOIDOSCOPY;  Surgeon: Charna Elizabeth, MD;  Location: [facility];  Service: Endoscopy;  Laterality: N/A;    Family History  Problem Relation Age of Onset  . Heart failure Mother   . COPD Father   . Diabetes Sister   . Heart failure Sister     History  Substance Use Topics  . Smoking status: Former Smoker -- 2.0 packs/day for 50 years    Types: Cigarettes    Quit date: 02/23/1991  . Smokeless tobacco: Never Used     Comment: Quite 20 Yrs Ago  .  Alcohol Use: No     Comment: hx of no longer uses     OB History    Grav Para Term Preterm Abortions TAB SAB Ect Mult Living                  Review of Systems  Constitutional:       Per HPI, otherwise negative  HENT:       Per HPI, otherwise negative  Eyes: Negative.   Respiratory:       Per HPI, otherwise negative  Cardiovascular:       Per HPI, otherwise negative  Gastrointestinal: Positive for nausea. Negative for vomiting.  Genitourinary: Negative.   Musculoskeletal:       Per HPI, otherwise negative  Skin: Negative.   Neurological: Positive for weakness. Negative for syncope.    Allergies  Aspirin; Penicillins; Sulfa antibiotics; and Latex  Home Medications   Current Outpatient Rx  Name  Route  Sig  Dispense  Refill  . ALPRAZOLAM 0.5 MG PO TABS   Oral   Take 0.5 mg by mouth 3 (three) times daily.          Marland Kitchen  VITAMIN D 1000 UNITS PO TABS   Oral   Take 2,000 Units by mouth daily.         Marland Kitchen DICLOFENAC SODIUM 50 MG PO TBEC   Oral   Take 50 mg by mouth 2 (two) times daily.          Marland Kitchen DOCUSATE SODIUM 100 MG PO CAPS   Oral   Take 200 mg by mouth 2 (two) times daily.         Marland Kitchen ESCITALOPRAM OXALATE 20 MG PO TABS   Oral   Take 20 mg by mouth every morning.          . INSULIN ASPART 100 UNIT/ML [place] SOLN   Subcutaneous   Inject 3 Units into the skin 3 (three) times daily before meals. Injects 3 units every morning, dinner (noon), and supper.         . INSULIN GLARGINE 100 UNIT/ML  SOLN   Subcutaneous   Inject 31 Units into the skin at bedtime.          Marland Kitchen LEVOTHYROXINE SODIUM 50 MCG PO TABS   Oral   Take 50 mcg by mouth every morning.          Marland Kitchen LORATADINE 10 MG PO TABS   Oral   Take 10 mg by mouth daily.          Marland Kitchen METOPROLOL TARTRATE 50 MG PO TABS   Oral   Take 50 mg by mouth 2 (two) times daily.          Marland Kitchen MIRABEGRON ER 25 MG PO TB24   Oral   Take 25 mg by mouth daily.         Marland Kitchen ONE-DAILY MULTI VITAMINS PO TABS   Oral    Take 1 tablet by mouth daily with lunch.          . MULTIVITAMIN & MINERAL PO   Oral   Take 1 tablet by mouth daily. Multivitamin with calcium 1000mg , magnesium 400mg  and zinc 15 mg per daughter. Patient takes at nite         . FISH OIL 1000 MG PO CAPS   Oral   Take 1 capsule by mouth 4 (four) times daily.          Marland Kitchen PANTOPRAZOLE SODIUM 40 MG PO TBEC   Oral   Take 40 mg by mouth every morning.          Marland Kitchen POTASSIUM CHLORIDE CRYS ER 20 MEQ PO TBCR   Oral   Take 20 mEq by mouth daily with lunch.          . ROSUVASTATIN CALCIUM 10 MG PO TABS   Oral   Take 10 mg by mouth. Pt takes every other nite         . SOLIFENACIN SUCCINATE 5 MG PO TABS   Oral   Take 5 mg by mouth every morning.          Marland Kitchen VITAMIN E 400 UNITS PO CAPS   Oral   Take 400 Units by mouth every morning.          Marland Kitchen ZOLPIDEM TARTRATE 5 MG PO TABS   Oral   Take 5 mg by mouth at bedtime.            BP 157/66  Pulse 79  Temp 98.6 F (37 C) (Oral)  Resp 22  SpO2 95%  Physical Exam  Nursing note and vitals reviewed. Constitutional: She is oriented to person, place,  and time. She appears well-developed and well-nourished. No distress.  HENT:  Head: Normocephalic and atraumatic.  Eyes: Conjunctivae normal and EOM are normal.  Cardiovascular: Normal rate and regular rhythm.   Pulmonary/Chest: Effort normal. No accessory muscle usage or stridor. Not tachypneic. No respiratory distress. She has wheezes.  Abdominal: She exhibits no distension.  Musculoskeletal: She exhibits no edema.  Neurological: She is alert and oriented to person, place, and time. She displays atrophy. No cranial nerve deficit. She exhibits abnormal muscle tone.       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  Skin: Skin is warm and dry.  Psychiatric: She has a normal mood and affect.    ED Course  Procedures (including critical care time)  Labs Reviewed - No data to display Dg Chest 2  View  01/27/2012  *RADIOLOGY REPORT*  Clinical Data: Choked on food, chest pain and shortness of breath  CHEST - 2 VIEW  Comparison: Portable chest x-ray of 06/21/2011  Findings: The lungs are clear.  The heart is mildly enlarged and stable.  There are degenerative changes throughout the thoracic spine.  IMPRESSION: No active lung disease.  Stable mild cardiomegaly.   Original Report Authenticated By: Dwyane Dee, M.D.    O2 - 99%ra, normal  No diagnosis found.  Update: following one neb she continues to have wheezing on the right.  Update; I have discussed the results with our pulmonologist - we will obtain CT.  CTresults notable for 9mm FB in R mainstem.  I d/w the radiologist and the pulmonary critical care team.  11:32 PM Patient in no distress, oxygen saturation remains appropriate MDM  This elderly female presents with concerns of possible aspiration.  On exam the patient is not hypoxic, but she has right-sided wheezing.  She is also an in no distress, afebrile.  After chest x-ray was negative, but the patient was persistently wheezing, I discussed her care with her pulmonologists who recommended CT chest.  This demonstrated a 9 mm foreign body in the right bronchus mainstem.  I discussed the case several times with her pulmonology team to arrange bronchoscopy.  Throughout the patient's ED stay she remained in no distress.  Gerhard Munch, MD 01/27/12 2349  Gerhard Munch, MD 01/27/12 2351

## 2012-01-28 ENCOUNTER — Inpatient Hospital Stay (HOSPITAL_COMMUNITY): Payer: PRIVATE HEALTH INSURANCE

## 2012-01-28 LAB — GLUCOSE, CAPILLARY
Glucose-Capillary: 158 mg/dL — ABNORMAL HIGH (ref 70–99)
Glucose-Capillary: 158 mg/dL — ABNORMAL HIGH (ref 70–99)
Glucose-Capillary: 196 mg/dL — ABNORMAL HIGH (ref 70–99)
Glucose-Capillary: 176 mg/dL — ABNORMAL HIGH (ref 70–99)

## 2012-01-28 LAB — PROTIME-INR
INR: 0.92 (ref 0.00–1.49)
Prothrombin Time: 12.3 seconds (ref 11.6–15.2)

## 2012-01-28 LAB — CBC
Platelets: 232 10*3/uL (ref 150–400)
RBC: 4.6 MIL/uL (ref 3.87–5.11)
WBC: 12.3 10*3/uL — ABNORMAL HIGH (ref 4.0–10.5)

## 2012-01-28 LAB — BASIC METABOLIC PANEL
Calcium: 9.4 mg/dL (ref 8.4–10.5)
GFR calc non Af Amer: 84 mL/min — ABNORMAL LOW (ref >90)
Sodium: 139 mEq/L (ref 135–145)

## 2012-01-28 MED ORDER — MIDAZOLAM HCL 5 MG/ML IJ SOLN
INTRAMUSCULAR | Status: AC
  Filled 2012-01-28: qty 1

## 2012-01-28 MED ORDER — DARIFENACIN HYDROBROMIDE ER 7.5 MG PO TB24
7.5000 mg | ORAL_TABLET | Freq: Every day | ORAL | Status: DC
  Administered 2012-01-28: 7.5 mg via ORAL
  Filled 2012-01-28: qty 1

## 2012-01-28 MED ORDER — VITAMIN E 180 MG (400 UNIT) PO CAPS
400.0000 [IU] | ORAL_CAPSULE | Freq: Every day | ORAL | Status: DC
  Administered 2012-01-28: 400 [IU] via ORAL
  Filled 2012-01-28: qty 1

## 2012-01-28 MED ORDER — POTASSIUM CHLORIDE CRYS ER 20 MEQ PO TBCR
20.0000 meq | EXTENDED_RELEASE_TABLET | Freq: Every day | ORAL | Status: DC
  Administered 2012-01-28: 20 meq via ORAL
  Filled 2012-01-28: qty 1

## 2012-01-28 MED ORDER — ATORVASTATIN CALCIUM 20 MG PO TABS
20.0000 mg | ORAL_TABLET | Freq: Every day | ORAL | Status: DC
  Administered 2012-01-28: 20 mg via ORAL
  Filled 2012-01-28: qty 1

## 2012-01-28 MED ORDER — VITAMIN D3 25 MCG (1000 UNIT) PO TABS
2000.0000 [IU] | ORAL_TABLET | Freq: Every day | ORAL | Status: DC
  Administered 2012-01-28: 2000 [IU] via ORAL
  Filled 2012-01-28: qty 2

## 2012-01-28 MED ORDER — ALPRAZOLAM 0.5 MG PO TABS
0.5000 mg | ORAL_TABLET | Freq: Three times a day (TID) | ORAL | Status: DC
  Administered 2012-01-28 (×2): 0.5 mg via ORAL
  Filled 2012-01-28 (×2): qty 1

## 2012-01-28 MED ORDER — IPRATROPIUM BROMIDE 0.02 % IN SOLN
RESPIRATORY_TRACT | Status: AC
  Administered 2012-01-28: 0.5 mg via RESPIRATORY_TRACT
  Filled 2012-01-28: qty 2.5

## 2012-01-28 MED ORDER — FENTANYL CITRATE 0.05 MG/ML IJ SOLN
50.0000 ug | Freq: Once | INTRAMUSCULAR | Status: AC
  Administered 2012-01-28: 50 ug via INTRAVENOUS

## 2012-01-28 MED ORDER — DOCUSATE SODIUM 100 MG PO CAPS
200.0000 mg | ORAL_CAPSULE | Freq: Two times a day (BID) | ORAL | Status: DC
  Filled 2012-01-28 (×2): qty 2

## 2012-01-28 MED ORDER — MOXIFLOXACIN HCL 400 MG PO TABS: 400.0000 mg | ORAL_TABLET | Freq: Every day | ORAL | Status: DC

## 2012-01-28 MED ORDER — LEVOTHYROXINE SODIUM 50 MCG PO TABS
50.0000 ug | ORAL_TABLET | Freq: Every day | ORAL | Status: DC
Start: 2012-01-28 — End: 2012-01-28
  Administered 2012-01-28: 50 ug via ORAL
  Filled 2012-01-28 (×2): qty 1

## 2012-01-28 MED ORDER — IPRATROPIUM BROMIDE 0.02 % IN SOLN
0.5000 mg | RESPIRATORY_TRACT | Status: DC | PRN
  Administered 2012-01-28: 0.5 mg via RESPIRATORY_TRACT

## 2012-01-28 MED ORDER — LEVOTHYROXINE SODIUM 50 MCG PO TABS
50.0000 ug | ORAL_TABLET | ORAL | Status: DC
  Filled 2012-01-28 (×2): qty 1

## 2012-01-28 MED ORDER — ALBUTEROL SULFATE (5 MG/ML) 0.5% IN NEBU: 2.5000 mg | INHALATION_SOLUTION | RESPIRATORY_TRACT | Status: DC | PRN

## 2012-01-28 MED ORDER — ZOLPIDEM TARTRATE 5 MG PO TABS: 5.0000 mg | ORAL_TABLET | Freq: Every day | ORAL | Status: DC

## 2012-01-28 MED ORDER — LIDOCAINE HCL 2 % EX GEL: Freq: Once | CUTANEOUS | Status: DC

## 2012-01-28 MED ORDER — MIDAZOLAM HCL 5 MG/ML IJ SOLN
4.0000 mg | Freq: Once | INTRAMUSCULAR | Status: AC
  Administered 2012-01-28: 4 mg via INTRAVENOUS

## 2012-01-28 MED ORDER — BUTAMBEN-TETRACAINE-BENZOCAINE 2-2-14 % EX AERO: 1.0000 | INHALATION_SPRAY | Freq: Once | CUTANEOUS | Status: DC

## 2012-01-28 MED ORDER — PHENYLEPHRINE HCL 0.25 % NA SOLN
1.0000 | Freq: Four times a day (QID) | NASAL | Status: DC | PRN
  Filled 2012-01-28: qty 15

## 2012-01-28 MED ORDER — DICLOFENAC SODIUM 50 MG PO TBEC
50.0000 mg | DELAYED_RELEASE_TABLET | Freq: Two times a day (BID) | ORAL | Status: DC
  Administered 2012-01-28: 50 mg via ORAL
  Filled 2012-01-28 (×3): qty 1

## 2012-01-28 MED ORDER — LORATADINE 10 MG PO TABS
10.0000 mg | ORAL_TABLET | Freq: Every day | ORAL | Status: DC
  Administered 2012-01-28: 10 mg via ORAL
  Filled 2012-01-28: qty 1

## 2012-01-28 MED ORDER — INSULIN ASPART 100 UNIT/ML ~~LOC~~ SOLN
0.0000 [IU] | Freq: Three times a day (TID) | SUBCUTANEOUS | Status: DC
  Administered 2012-01-28 (×2): 3 [IU] via SUBCUTANEOUS

## 2012-01-28 MED ORDER — PANTOPRAZOLE SODIUM 40 MG PO TBEC
40.0000 mg | DELAYED_RELEASE_TABLET | Freq: Every day | ORAL | Status: DC
  Administered 2012-01-28: 40 mg via ORAL
  Filled 2012-01-28: qty 1

## 2012-01-28 MED ORDER — FENTANYL CITRATE 0.05 MG/ML IJ SOLN
INTRAMUSCULAR | Status: AC
  Filled 2012-01-28: qty 2

## 2012-01-28 MED ORDER — INSULIN GLARGINE 100 UNIT/ML ~~LOC~~ SOLN: 31.0000 [IU] | Freq: Every day | SUBCUTANEOUS | Status: DC

## 2012-01-28 MED ORDER — ALBUTEROL SULFATE (5 MG/ML) 0.5% IN NEBU
INHALATION_SOLUTION | RESPIRATORY_TRACT | Status: AC
  Filled 2012-01-28: qty 0.5

## 2012-01-28 MED ORDER — VITAMIN E 180 MG (400 UNIT) PO CAPS
400.0000 [IU] | ORAL_CAPSULE | ORAL | Status: DC
  Filled 2012-01-28: qty 1

## 2012-01-28 MED ORDER — METOPROLOL TARTRATE 50 MG PO TABS
50.0000 mg | ORAL_TABLET | Freq: Two times a day (BID) | ORAL | Status: DC
  Administered 2012-01-28: 50 mg via ORAL
  Filled 2012-01-28 (×2): qty 1

## 2012-01-28 MED ORDER — PANTOPRAZOLE SODIUM 40 MG PO TBEC: 40.0000 mg | DELAYED_RELEASE_TABLET | ORAL | Status: DC

## 2012-01-28 MED ORDER — ESCITALOPRAM OXALATE 20 MG PO TABS
20.0000 mg | ORAL_TABLET | Freq: Every day | ORAL | Status: DC
  Administered 2012-01-28: 20 mg via ORAL
  Filled 2012-01-28 (×2): qty 1

## 2012-01-28 MED ORDER — ONDANSETRON HCL 4 MG/2ML IJ SOLN
4.0000 mg | Freq: Three times a day (TID) | INTRAMUSCULAR | Status: DC | PRN
  Administered 2012-01-28: 4 mg via INTRAVENOUS
  Filled 2012-01-28: qty 2

## 2012-01-28 MED ORDER — SODIUM CHLORIDE 0.9 % IV SOLN: 250.0000 mL | INTRAVENOUS | Status: DC | PRN

## 2012-01-28 MED ORDER — INSULIN ASPART 100 UNIT/ML ~~LOC~~ SOLN: 3.0000 [IU] | Freq: Three times a day (TID) | SUBCUTANEOUS | Status: DC

## 2012-01-28 MED ORDER — MIRABEGRON ER 25 MG PO TB24
25.0000 mg | ORAL_TABLET | Freq: Every day | ORAL | Status: DC
  Administered 2012-01-28: 25 mg via ORAL
  Filled 2012-01-28: qty 1

## 2012-01-28 MED ORDER — ALBUTEROL SULFATE (5 MG/ML) 0.5% IN NEBU
2.5000 mg | INHALATION_SOLUTION | RESPIRATORY_TRACT | Status: DC | PRN
  Administered 2012-01-28: 2.5 mg via RESPIRATORY_TRACT

## 2012-01-28 MED ORDER — ESCITALOPRAM OXALATE 20 MG PO TABS
20.0000 mg | ORAL_TABLET | ORAL | Status: DC
  Filled 2012-01-28: qty 1

## 2012-01-28 NOTE — Progress Notes (Signed)
Nutrition Brief Note  Patient identified on the Malnutrition Screening Tool (MST) Report  Body mass index is 35.81 kg/(m^2). Pt meets criteria for obese class II based on current BMI.  Wt Readings from Last 10 Encounters:  01/28/12 195 lb 12.3 oz (88.8 kg)  01/28/12 195 lb 12.3 oz (88.8 kg)  08/17/11 206 lb (93.441 kg)  08/17/11 206 lb (93.441 kg)  05/11/11 206 lb 6.4 oz (93.622 kg)  04/16/11 190 lb (86.183 kg)    Current diet order is NPO pending bronchoscopy.  Pt is also planning for SLP eval once procedure complete. Labs and medications reviewed.   Pt with no wt change PTA.  Pt denies trouble chewing or swallowing stating "I don't care what's in my mouth if you ask me something while I'm eating I'll answer you and this time I got choked.  I'll never eat peas again." No nutrition interventions warranted at this time. If nutrition issues arise, please consult RD.  Will follow for diet advancement.  Loyce Dys, MS RD LDN Clinical Inpatient Dietitian Pager: [phone] Weekend/After hours pager: [phone]

## 2012-01-28 NOTE — Discharge Summary (Signed)
Physician Discharge Summary     Patient ID: Sarah Skinner MRN: [id number] DOB/AGE: 1940/07/05 71 y.o.  Admit date: 01/27/2012 Discharge date: 01/28/2012  Admission Diagnoses: Aspirated foreign body (pea) Discharge Diagnoses:  Principal Problem:  *Aspiration of foreign body in respiratory tract Active Problems:  GERD (gastroesophageal reflux disease)  DM2 (diabetes mellitus, type 2)  HTN (hypertension)  CVA (cerebral infarction)   Significant Hospital tests/ studies/ interventions and procedures  BRIEF PATIENT DESCRIPTION: This is a 71 y/o female with a history of a prior stroke who presented to the [facility] ED on 12/[facility] after aspirating a pea at lunch. She was admitted for bronchoscopy in the morning.     Hospital Course:  aspiration of foreign body (likely pea) into bronchus intermedius; no respiratory distress.   Underwent bronchoscopy on 12/6: Pea was found in the bronchus intermedius. Suction was applied on the scope and the scope was slowly retracted from the airway. Upon passing back through the cords, the pea dislodged and fell back into the airway. This maneuver was repeated and as the scope was retracted into the pharynx, the pea again dislodged. We suctioned her oropharynx with the [place] without retrieval. A finger sweep of her oropharynx failed to retrieve the pea. The scope was again directed into the trachea and a full airway exam of all of the segmental airways failed to find the pea. At that point, it was assumed that she had swallowed it and the procedure was terminated. She was recovered in the ICU. Oxygen was weaned to off. Her saturations were in mid-90s, and now reports no dyspnea.  In regards to her other chronic illnesses: Prior CVA and hemiplegia, HTN, DM & GERD.  No treatment changes have been made or are recommended for these illnesses.  PLAN:  -aspiration precautions have been discussed at length w/ daughter -will see her in f/u 1 week for CXR - a  prescription of avelox was provided to the daughter and she has been instructed to fill this should her mother develop fever and cough, she was also provided our office number.   Discharge Exam: BP 169/79  Pulse 98  Temp 98.4 F (36.9 C) (Oral)  Resp 19  Ht 5\' 2"  (1.575 m)  Wt 88.8 kg (195 lb 12.3 oz)  BMI 35.81 kg/m2  SpO2 97% Room air  PHYSICAL EXAMINATION:  Gen: chronically ill appearing, no acute distress  HEENT: NCAT, PERRL, EOMi, OP clear, neck supple without masses  PULM: Wheeze bilaterally R > L  CV: RRR, no mgr, no JVD  AB: BS+, soft, nontender, no hsm  Ext: warm, no edema, no clubbing, no cyanosis  Derm: no rash or skin breakdown  Neuro: A&Ox4, CN II-XII intact, strength 5/5 in R arm and leg, contractures and 0/5 strength L arm   Labs at discharge Lab Results  Component Value Date   CREATININE 0.75 01/27/2012   BUN 8 01/27/2012   NA 139 01/27/2012   K 3.5 01/27/2012   CL 98 01/27/2012   CO2 30 01/27/2012   Lab Results  Component Value Date   WBC 12.3* 01/27/2012   HGB 14.1 01/27/2012   HCT 40.8 01/27/2012   MCV 88.7 01/27/2012   PLT 232 01/27/2012   Lab Results  Component Value Date   ALT 51* 08/17/2011   AST 27 08/17/2011   ALKPHOS 74 08/17/2011   BILITOT 0.7 08/17/2011   Lab Results  Component Value Date   INR 0.92 01/28/2012   INR 0.98 08/17/2011   INR 0.93  02/04/2010    Current radiology studies Dg Chest 2 View  01/27/2012  *RADIOLOGY REPORT*  Clinical Data: Choked on food, chest pain and shortness of breath  CHEST - 2 VIEW  Comparison: Portable chest x-ray of 06/21/2011  Findings: The lungs are clear.  The heart is mildly enlarged and stable.  There are degenerative changes throughout the thoracic spine.  IMPRESSION: No active lung disease.  Stable mild cardiomegaly.   Original Report Authenticated By: Dwyane Dee, M.D.    Ct Chest Wo Contrast  01/27/2012  *RADIOLOGY REPORT*  Clinical Data: Possible aspiration, evaluate for pathology or foreign body  CT CHEST  WITHOUT CONTRAST  Technique:  Multidetector CT imaging of the chest was performed following the standard protocol without IV contrast.  Comparison: Chest x-ray of 01/27/2012  Findings: On the lung window images, no parenchymal infiltrate is seen.  No effusion is noted. Within the right mainstem bronchus, there is a 9 mm oval soft tissue mass which consistent with an aspirated foreign body.  A small calcified granuloma is present in the left lower lobe posteriorly.  No acute bony abnormality is seen.  On soft tissue window images, the thyroid gland is unremarkable. On this unenhanced study no mediastinal or hilar adenopathy is seen.  There is cardiomegaly present.  The upper abdomen is unremarkable.  IMPRESSION:  1.  9 mm oval soft tissue mass in the right mainstem bronchus consistent with an aspirated foreign body. 2.  No focal infiltrate or effusion. 3.  Cardiomegaly.   Original Report Authenticated By: Dwyane Dee, M.D.     Disposition:  06-Home-Health Care Svc      Discharge Orders    Future Appointments: Provider: Department: Dept Phone: Center:   02/04/2012 10:15 AM Kalman Shan, MD [facility] [phone] None       Medication List     As of 01/28/2012  2:32 PM    TAKE these medications         ALPRAZolam 0.5 MG tablet   Commonly known as: XANAX   Take 0.5 mg by mouth 3 (three) times daily.      cholecalciferol 1000 UNITS tablet   Commonly known as: VITAMIN D   Take 2,000 Units by mouth daily.      diclofenac 50 MG EC tablet   Commonly known as: VOLTAREN   Take 50 mg by mouth 2 (two) times daily.      docusate sodium 100 MG capsule   Commonly known as: COLACE   Take 200 mg by mouth 2 (two) times daily.      escitalopram 20 MG tablet   Commonly known as: LEXAPRO   Take 20 mg by mouth every morning.      Fish Oil 1000 MG Caps   Take 1 capsule by mouth 4 (four) times daily.      insulin aspart 100 UNIT/ML injection   Commonly known as: novoLOG   Inject 3  Units into the skin 3 (three) times daily before meals. Injects 3 units every morning, dinner (noon), and supper.      insulin glargine 100 UNIT/ML injection   Commonly known as: LANTUS   Inject 31 Units into the skin at bedtime.      levothyroxine 50 MCG tablet   Commonly known as: SYNTHROID, LEVOTHROID   Take 50 mcg by mouth every morning.      loratadine 10 MG tablet   Commonly known as: CLARITIN   Take 10 mg by mouth daily.  metoprolol 50 MG tablet   Commonly known as: LOPRESSOR   Take 50 mg by mouth 2 (two) times daily.      mirabegron ER 25 MG Tb24   Commonly known as: MYRBETRIQ   Take 25 mg by mouth daily.      moxifloxacin 400 MG tablet   Commonly known as: AVELOX   Take 1 tablet (400 mg total) by mouth daily. (Fill this only if develop fever and cough)       MULTIVITAMIN & MINERAL PO   Take 1 tablet by mouth daily. Multivitamin with calcium 1000mg , magnesium 400mg  and zinc 15 mg per daughter. Patient takes at nite      multivitamin tablet   Take 1 tablet by mouth daily with lunch.      pantoprazole 40 MG tablet   Commonly known as: PROTONIX   Take 40 mg by mouth every morning.      potassium chloride SA 20 MEQ tablet   Commonly known as: K-DUR,KLOR-CON   Take 20 mEq by mouth daily with lunch.      rosuvastatin 10 MG tablet   Commonly known as: CRESTOR   Take 10 mg by mouth. Pt takes every other nite      solifenacin 5 MG tablet   Commonly known as: VESICARE   Take 5 mg by mouth every morning.      vitamin E 400 UNIT capsule   Take 400 Units by mouth every morning.      zolpidem 5 MG tablet   Commonly known as: AMBIEN   Take 5 mg by mouth at bedtime.        Follow-up Information    Follow up with [facility], MD. On 02/04/2012. (report at 10am on second floor for check in, followed by CXR in basement and appointment w/ Dr Marchelle Gearing at 1030am )    Contact information:   [address] [place] [address] [phone]           Discharged Condition: {good Physician Statement:   The Patient was personally examined, the discharge assessment and plan has been personally reviewed and I agree with ACNP Babcock's assessment and plan. > 30 minutes of time have been dedicated to discharge assessment, planning and discharge instructions.   SignedAnders Simmonds 01/28/2012, 2:32 PM   Agree with above  Merwyn Katos, MD

## 2012-01-28 NOTE — Procedures (Signed)
Bronchoscopy Procedure Note Sarah Skinner [id number] September 15, 1940  Bronchoscopy Procedure Note  Sarah Skinner is a 71 y.o. female   Indications:    Consent: Risks of procedure as well as the alternatives and risks of each were explained to the patient or surrogate. Consent for procedure obtained.   Time Out performed: Verified patient identification, verified procedure, site/side was marked, verified correct patient position, special equipment/implants available, medications/allergies/relevent history reviewed, required imaging and test results available.   Sedation: Versed 4 mg, fentanyl 50 mcg  Anesthesia: topical anesthesia was applied to the nose and throat and 30 cc of 1% lidocaine were used during the course of the procedure   Procedure: After adequate sedation and topical anesthesia, the bronchoscope was introduced via the R naris and advanced into the posterior pharynx. Upper airway anatomy was normal. Vocal cords moved symmetrically. Further anesthesia was achieved with 1% lidocaine and the scope was advanced into the trachea. Airway anesthesia was completed with 1% lidocaine and an airway exam was performed. This revealed normal segmental airway anatomy. The bronchial mucosa was normal. There were no significant secretions. There was a pea noted in bronchus intermedius. The scope tip was directed to the pea and suction was applied on the scope and the scope was slowly retracted from the airway. Upon passing back through the cords, the pea dislodged and fell back into the airway. This maneuver was repeated and as the scope was retracted into the pharynx, the pea again dislodged. We suctioned her oropharynx without retrieval. A finger sweep of her oropharynx failed to retrieve the pea. The scope was again directed into the trachea and a full airway exam of all of the segmental airways failed to find the pea. At that point, it was assumed that she had swallowed it and the procedure was  terminated.   Complications:  There were no complications noted. The patient tolerated the procedure well.   Specimens: None  Impression: Aspirated pea - believed to have been successfully removed to the level of the pharynx, then swallowed by the patient    Recommendations:  After recovery from the procedure, she may be discharged to home and should return next week to [facility] for repeat CXR and evaluation    Billy Fischer, MD;  PCCM service; Mobile [phone]

## 2012-01-28 NOTE — Progress Notes (Signed)
CARE MANAGEMENT NOTE 01/28/2012  Patient:  Sarah Skinner, Sarah Skinner   Account Number:  [id number]  Date Initiated:  01/28/2012  Documentation initiated by:  Stellarose Cerny  Subjective/Objective Assessment:   PT CHOCKED WHILE EATING, CT OF CHEST DID CONFIRM FB IN THE BRONCHUS, WBC ELEVATED, BRONCH REQUIRED FOR REMOVAL     Action/Plan:   FROM HOME   Anticipated DC Date:  01/31/2012   Anticipated DC Plan:  HOME W HOME HEALTH SERVICES  In-house referral  NA      DC Planning Services  CM consult      [facility] Choice  HOME HEALTH   Choice offered to / List presented to:          [facility] arranged  HH-4 NURSE'S AIDE      HH agency  [facility]   Status of service:  In process, will continue to follow Medicare Important Message given?  NA - LOS <3 / Initial given by admissions (If response is "NO", the following Medicare IM given date fields will be blank) Date Medicare IM given:   Date Additional Medicare IM given:    Discharge Disposition:    Per UR Regulation:  Reviewed for med. necessity/level of care/duration of stay  If discussed at Long Length of Stay Meetings, dates discussed:    Comments:  [id number]/Shaterra Sanzone Earlene Plater, RN, BSN, CCM: CHART REVIEWED AND UPDATED.  Next chart review due on 16109604. NO DISCHARGE NEEDS PRESENT AT THIS TIME. CASE MANAGEMENT [phone]

## 2012-01-28 NOTE — Progress Notes (Signed)
Fredy Gladu, MD ; PCCM service Mobile [phone].  After 5:30 PM or weekends, call [phone]  

## 2012-01-28 NOTE — Progress Notes (Signed)
SLP Cancellation Note  Patient Details Name: Sarah Skinner MRN: [id number] DOB: 1940/03/05   Cancelled treatment:        Pt for bronchoscopy today, SLP asked RN to page when pt alert and able to have swallow evaluation completed.    Donavan Burnet, MS [facility] SLP [phone]    Chales Abrahams 01/28/2012, 10:33 AM

## 2012-01-28 NOTE — Progress Notes (Signed)
SLP Cancellation Note  Patient Details Name: FRANCISCO EYERLY MRN: [id number] DOB: Jun 10, 1940   Cancelled eval:        SLP informed by RN that pt is to dc home and swallow evaluation not indicated.  Please reorder if desire given pt h/o CVA and GERD.  Instrumental assessment of swallow could be completed as outpt if pt presents with further aspiration/dysphagia.  Donavan Burnet, MS [facility] SLP [phone]

## 2012-01-28 NOTE — Procedures (Signed)
Bedside Bronchoscopy Procedure Note Sarah Skinner [id number] 1941/02/14  Procedure: Bronchoscopy Indications: foreign body removal- pea  Procedure Details: ET Tube Size: ET Tube secured at lip (cm): Bite block in place: Yes In preparation for procedure, Patient hyper-oxygenated with 4 lpm [place] % FiO2 Airway entered and the following bronchi were examined: Bronchi.   Bronchoscope removed.  [name]  Evaluation BP 169/79  Pulse 98  Temp 99.4 F (37.4 C) (Oral)  Resp 19  Ht 5\' 2"  (1.575 m)  Wt 195 lb 12.3 oz (88.8 kg)  BMI 35.81 kg/m2  SpO2 97% Breath Sounds:Diminished O2 sats: stable throughout Patient's Current Condition: stable Specimens:  None Complications: No apparent complications Patient did tolerate procedure well.   Jennette Kettle 01/28/2012, 12:58 PM

## 2012-01-28 NOTE — Progress Notes (Signed)
Bronch with video intervention performed d/t foreign body removal (pea).  No lab specimens obtained.

## 2012-01-31 LAB — GLUCOSE, CAPILLARY: Glucose-Capillary: 178 mg/dL — ABNORMAL HIGH (ref 70–99)

## 2012-02-04 ENCOUNTER — Encounter: Payer: Self-pay | Admitting: Internal Medicine

## 2012-02-04 ENCOUNTER — Ambulatory Visit (INDEPENDENT_AMBULATORY_CARE_PROVIDER_SITE_OTHER): Payer: Medicare Other | Admitting: Internal Medicine

## 2012-02-04 ENCOUNTER — Ambulatory Visit (INDEPENDENT_AMBULATORY_CARE_PROVIDER_SITE_OTHER)
Admission: RE | Admit: 2012-02-04 | Discharge: 2012-02-04 | Disposition: A | Discharge status: Home / Self Care | Payer: Medicare Other | Source: Ambulatory Visit | Attending: Internal Medicine | Admitting: Internal Medicine

## 2012-02-04 VITALS — BP 134/88 | HR 66 | Temp 98.4°F | Ht 62.0 in

## 2012-02-04 DIAGNOSIS — T17908A Unspecified foreign body in respiratory tract, part unspecified causing other injury, initial encounter: Secondary | ICD-10-CM

## 2012-02-04 NOTE — Progress Notes (Signed)
Subjective:    Patient ID: Sarah Skinner, female    DOB: Mar 24, 1940, 71 y.o.   MRN: [id number]  HPI  Aspirated pea 121/5/13 (chronic stroke patient) - presented with dyspnea and focal right wheeze. And seen on CT chest 01/27/12 but not cxr. S/p bronch 01/28/12 - pea fell off two times when pulled back with suction around the vocal cord area per operator Dr simonds. Post this, surveillance bronch showed no pea in airways  Currently 02/04/2012  - feels well. Asymptomatic. Daughter, she and husband: no wheeze or dysnea or cough. She, daughter and husband consistently deny any cough since disccharge from hospital though at entry she told CMA she still had cough.   - she is reportedly eating slowly and taking more care. She did not see speech for Swalow eval while in hospital. She is now refusing to see speech for swallow eval and education  CXR 02/04/2012  Dg Chest 2 View  02/04/2012  *RADIOLOGY REPORT*  Clinical Data: Aspiration.  Follow-up bronchoscopy.  CHEST - 2 VIEW  Comparison: 01/27/2012  Findings: Mild cardiomegaly.  Lingular density likely reflects scarring.  Heart is normal size.  No effusions or pneumothorax.  No acute bony abnormality.  IMPRESSION: Mild cardiomegaly.  Lingular density, likely scarring.  No pneumothorax following bronchoscopy.   Original Report Authenticated By: Charlett Nose, M.D.     Review of Systems  Constitutional: Negative for fever and unexpected weight change.  HENT: Negative for ear pain, nosebleeds, congestion, sore throat, rhinorrhea, sneezing, trouble swallowing, dental problem, postnasal drip and sinus pressure.   Eyes: Negative for redness and itching.  Respiratory: Positive for cough. Negative for chest tightness, shortness of breath and wheezing.   Cardiovascular: Negative for palpitations and leg swelling.  Gastrointestinal: Negative for nausea and vomiting.  Genitourinary: Negative for dysuria.  Musculoskeletal: Negative for joint swelling.  Skin:  Negative for rash.  Neurological: Negative for headaches.  Hematological: Does not bruise/bleed easily.  Psychiatric/Behavioral: Negative for dysphoric mood. The patient is not nervous/anxious.    Past, Family, Social reviewed: no change since last visit     Objective:   Physical Exam  Vitals reviewed. Constitutional: She is oriented to person, place, and time. She appears well-developed and well-nourished. No distress.       Obese There is no weight on file to calculate BMI. Sitting in wheel chair Looks well  HENT:  Head: Normocephalic and atraumatic.  Right Ear: External ear normal.  Left Ear: External ear normal.  Mouth/Throat: Oropharynx is clear and moist. No oropharyngeal exudate.  Eyes: Conjunctivae normal and EOM are normal. Pupils are equal, round, and reactive to light. Right eye exhibits no discharge. Left eye exhibits no discharge. No scleral icterus.  Neck: Normal range of motion. Neck supple. No JVD present. No tracheal deviation present. No thyromegaly present.  Cardiovascular: Normal rate, regular rhythm, normal heart sounds and intact distal pulses.  Exam reveals no gallop and no friction rub.   No murmur heard. Pulmonary/Chest: Effort normal and breath sounds normal. No respiratory distress. She has no wheezes. She has no rales. She exhibits no tenderness.  Abdominal: Soft. Bowel sounds are normal. She exhibits no distension and no mass. There is no tenderness. There is no rebound and no guarding.  Musculoskeletal: Normal range of motion. She exhibits no edema and no tenderness.  Lymphadenopathy:    She has no cervical adenopathy.  Neurological: She is alert and oriented to person, place, and time. She has normal reflexes. No cranial nerve deficit. She  exhibits normal muscle tone. Coordination abnormal.       Stroke patient  Skin: Skin is warm and dry. No rash noted. She is not diaphoretic. No erythema. No pallor.  Psychiatric: She has a normal mood and affect. Her  behavior is normal. Judgment and thought content normal.          Assessment & Plan:

## 2012-02-04 NOTE — Assessment & Plan Note (Signed)
Clinically well post bronch. Today 02/04/2012 CXR shows some left sided densit that his new. Because the pea was lost during attempted removal concern is that if this post ssubegmental atelectasis due to retained pea. However, a) bronch after pea was lost showed absence of pea in the airways and b) she is feeling well. To sort out, rigid bronch under GA by CVTS is next best option but after discussion we all mutually agreed expectant approach is best giver her age, proclivity not to undergo GA. Thy will return in 4 weeks with repeat CXR or come sooner if there are resp smptoms  > 50% of this 15 min visit spent in face to face counseling

## 2012-02-04 NOTE — Patient Instructions (Addendum)
The cxr today shows small patch in left lung and could be related to pea if the pea is indeed in lung though odds are small pea is in lung Most likely you swallowed the pea  Glad you are doing well REturn in 4-6 weeks with repeat cxr If any breathing or chest issues, call us sooner

## 2012-03-20 ENCOUNTER — Ambulatory Visit: Payer: Medicare Other | Admitting: Internal Medicine

## 2012-04-13 ENCOUNTER — Ambulatory Visit: Payer: Medicare Other | Admitting: Internal Medicine

## 2012-05-10 DIAGNOSIS — Z029 Encounter for administrative examinations, unspecified: Secondary | ICD-10-CM

## 2012-05-16 ENCOUNTER — Encounter: Payer: Medicare Other | Admitting: Internal Medicine

## 2012-05-16 ENCOUNTER — Ambulatory Visit (INDEPENDENT_AMBULATORY_CARE_PROVIDER_SITE_OTHER)
Admission: RE | Admit: 2012-05-16 | Discharge: 2012-05-16 | Disposition: A | Discharge status: Home / Self Care | Payer: Medicare Other | Source: Ambulatory Visit | Attending: Internal Medicine | Admitting: Internal Medicine

## 2012-05-16 DIAGNOSIS — T17908A Unspecified foreign body in respiratory tract, part unspecified causing other injury, initial encounter: Secondary | ICD-10-CM

## 2012-05-17 ENCOUNTER — Other Ambulatory Visit: Payer: Self-pay | Admitting: Family Medicine

## 2012-05-18 ENCOUNTER — Telehealth: Payer: Self-pay | Admitting: Family Medicine

## 2012-05-18 NOTE — Telephone Encounter (Signed)
Having lots of headaches. Sinus? Can she take ibuprofen? Or something else?

## 2012-05-18 NOTE — Telephone Encounter (Signed)
Left message to return call regarding headaches

## 2012-05-19 NOTE — Telephone Encounter (Signed)
No return call from pt

## 2012-05-23 ENCOUNTER — Encounter: Payer: Self-pay | Admitting: Family Medicine

## 2012-05-23 ENCOUNTER — Ambulatory Visit (INDEPENDENT_AMBULATORY_CARE_PROVIDER_SITE_OTHER): Payer: PRIVATE HEALTH INSURANCE | Admitting: Family Medicine

## 2012-05-23 VITALS — BP 135/80 | HR 69 | Temp 97.1°F

## 2012-05-23 DIAGNOSIS — I10 Essential (primary) hypertension: Secondary | ICD-10-CM

## 2012-05-23 DIAGNOSIS — F341 Dysthymic disorder: Secondary | ICD-10-CM

## 2012-05-23 DIAGNOSIS — E039 Hypothyroidism, unspecified: Secondary | ICD-10-CM

## 2012-05-23 DIAGNOSIS — F0391 Unspecified dementia with behavioral disturbance: Secondary | ICD-10-CM

## 2012-05-23 DIAGNOSIS — I639 Cerebral infarction, unspecified: Secondary | ICD-10-CM

## 2012-05-23 DIAGNOSIS — F418 Other specified anxiety disorders: Secondary | ICD-10-CM

## 2012-05-23 DIAGNOSIS — E119 Type 2 diabetes mellitus without complications: Secondary | ICD-10-CM

## 2012-05-23 DIAGNOSIS — E785 Hyperlipidemia, unspecified: Secondary | ICD-10-CM

## 2012-05-23 DIAGNOSIS — K219 Gastro-esophageal reflux disease without esophagitis: Secondary | ICD-10-CM

## 2012-05-23 DIAGNOSIS — I6350 Cerebral infarction due to unspecified occlusion or stenosis of unspecified cerebral artery: Secondary | ICD-10-CM

## 2012-05-23 DIAGNOSIS — N393 Stress incontinence (female) (male): Secondary | ICD-10-CM

## 2012-05-23 DIAGNOSIS — F03918 Unspecified dementia, unspecified severity, with other behavioral disturbance: Secondary | ICD-10-CM | POA: Insufficient documentation

## 2012-05-23 LAB — POCT GLYCOSYLATED HEMOGLOBIN (HGB A1C): Hemoglobin A1C: 6.3

## 2012-05-23 MED ORDER — DICLOFENAC SODIUM 50 MG PO TBEC: 50.0000 mg | DELAYED_RELEASE_TABLET | Freq: Two times a day (BID) | ORAL | Status: DC

## 2012-05-23 MED ORDER — ALPRAZOLAM 0.5 MG PO TABS: 0.5000 mg | ORAL_TABLET | Freq: Three times a day (TID) | ORAL | Status: DC

## 2012-05-23 MED ORDER — FREESTYLE LANCETS MISC: Status: DC

## 2012-05-23 MED ORDER — ZOLPIDEM TARTRATE 5 MG PO TABS: 5.0000 mg | ORAL_TABLET | Freq: Every day | ORAL | Status: DC

## 2012-05-23 MED ORDER — AMLODIPINE BESYLATE 5 MG PO TABS: ORAL_TABLET | ORAL | Status: DC

## 2012-05-23 MED ORDER — METOPROLOL TARTRATE 50 MG PO TABS: 50.0000 mg | ORAL_TABLET | Freq: Two times a day (BID) | ORAL | Status: DC

## 2012-05-23 MED ORDER — "INSULIN SYRINGE-NEEDLE U-100 31G X 5/16"" 0.3 ML MISC": 1.0000 | Freq: Four times a day (QID) | Status: DC

## 2012-05-23 NOTE — Progress Notes (Signed)
Quick Note:  Lab result at goal. No change in Medications for now. ______ 

## 2012-05-23 NOTE — Progress Notes (Signed)
Subjective:     Patient ID: Sarah Skinner, female   DOB: [birth year], 72 y.o.   MRN: [id number]  HPI 1. Vision changes on and off: Her last visit she was emergently sent over [facility]. Examine her eyes said it was due to cataract and the need to observe closely. Because of her risk factors for acute retinal event of any changes any to return stat symptoms have not returned.  2. Buttocks hurts. Ongoing complaints. Family thinks is due to her dementia. They have checked her. They haven't found anything wrong. He does have some constipation.  3.hallucinating she gets agitated at night and becomes a little delusional and hallucinates. She is not violent. It is just that she would stay up all night talking and not sleeping and it keeps other members of the household up all night. 4. CVA: No changes  Other medical problems at status quo.  Therefore blood tests. Last month because of the urgency of her visual change labs were not drawn.    PMH/PSH: reviewed/updated in Epic  SH/FH: reviewed/updated in Epic  Allergies: reviewed/updated in Epic  Medications: reviewed/updated in Epic  Immunizations: reviewed/updated in Epic      Review of Systems No other new complaints    Objective:   Physical Exam On examination she appeared in good health and spirits.  Vital signs as documented.BP 135/80  Pulse 69  Temp(Src) 97.1 F (36.2 C) (Oral) Obese in a wheelchair  Skin warm and dry and without overt rashes.  Neck without JVD.  Lungs clear.  Heart exam notable for regular rhythm, normal sounds and absence of murmurs, rubs or gallops. Abdomen unremarkable and without evidence of organomegaly, masses, or abdominal aortic enlargement.  Extremities nonedematous.  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 She is friendly and joking. And she is in constant complaints of various symptomatologies. Example complaining of rash around her neck and requesting creams when there is  no  skin rash Assessment:     DM (diabetes mellitus) - Plan: POCT glycosylated hemoglobin (Hb A1C), COMPLETE METABOLIC PANEL WITH GFR  HTN (hypertension) - Plan: COMPLETE METABOLIC PANEL WITH GFR  HLD (hyperlipidemia) - Plan: COMPLETE METABOLIC PANEL WITH GFR, NMR Lipoprofile with Lipids  CVA (cerebral vascular accident) - Plan: NMR Lipoprofile with Lipids  Hypothyroidism  Hypertension  GERD (gastroesophageal reflux disease)  CVA (cerebral infarction)  Stress incontinence  Depression with anxiety  Dementia with behavioral disturbance  Patient apparently have an increasing agitation and hallucinations in the evenings. The family has to use extra Xanax as a means to control her agitation.      Plan:     Orders Placed This Encounter  Procedures  . COMPLETE METABOLIC PANEL WITH GFR  . NMR Lipoprofile with Lipids  . POCT glycosylated hemoglobin (Hb A1C)   Results for orders placed in visit on 05/23/12 (from the past 24 hour(s))  POCT GLYCOSYLATED HEMOGLOBIN (HGB A1C)     Status: Normal   Collection Time    05/23/12  4:00 PM      Result Value Range   Hemoglobin A1C 6.3                                     Meds ordered this encounter  Medications  . DISCONTD: Insulin Syringe-Needle U-100 31G X 5/16" 0.3 ML MISC    Sig:   . glucose blood test strip  Sig:   . DISCONTD: Lancets (FREESTYLE) lancets    Sig:   . amLODipine (NORVASC) 5 MG tablet    Sig: TAKE ONE TABLET BY MOUTH EVERY DAY    Dispense:  30 tablet    Refill:  5  . diclofenac (VOLTAREN) 50 MG EC tablet    Sig: Take 1 tablet (50 mg total) by mouth 2 (two) times daily.    Dispense:  60 tablet    Refill:  5  . metoprolol (LOPRESSOR) 50 MG tablet    Sig: Take 1 tablet (50 mg total) by mouth 2 (two) times daily.    Dispense:  60 tablet    Refill:  5  . ALPRAZolam (XANAX) 0.5 MG tablet    Sig: Take 1 tablet (0.5 mg total) by mouth 3 (three) times daily.    Dispense:  100 tablet    Refill:  0  . zolpidem  (AMBIEN) 5 MG tablet    Sig: Take 1 tablet (5 mg total) by mouth at bedtime.    Dispense:  30 tablet    Refill:  2  . Lancets (FREESTYLE) lancets    Sig: Check sugars twice a day    Dispense:  100 each    Refill:  11  . Insulin Syringe-Needle U-100 31G X 5/16" 0.3 ML MISC    Sig: Inject 1 Syringe into the skin 4 (four) times daily.    Dispense:  120 each    Refill:  11   family to keep a diary of her behavior. If she has more hallucinations and confusions and requires more alprazolam, we may have to adjust the dosages.  We reviewed her extensive medication list. In reviewed and renewed appropriately needed medicines.  Return to clinic in 4 weeks.  Davelyn Gwinn P. Modesto Charon, M.D.

## 2012-05-24 LAB — NMR LIPOPROFILE WITH LIPIDS
Cholesterol, Total: 139 mg/dL (ref <200)
HDL Particle Number: 29.6 umol/L — ABNORMAL LOW (ref >=30.5)
HDL Size: 9 nm — ABNORMAL LOW (ref >=9.2)
HDL-C: 36 mg/dL — ABNORMAL LOW (ref >=40)
LDL (calc): 61 mg/dL (ref <100)
LDL Particle Number: 812 nmol/L (ref <1000)
LDL Size: 20.2 nm — ABNORMAL LOW (ref >20.5)
LP-IR Score: 52 — ABNORMAL HIGH (ref <=45)
Large HDL-P: 4.5 umol/L — ABNORMAL LOW (ref >=4.8)
Large VLDL-P: 3.1 nmol/L — ABNORMAL HIGH (ref <=2.7)
Small LDL Particle Number: 577 nmol/L — ABNORMAL HIGH (ref <=527)
Triglycerides: 208 mg/dL — ABNORMAL HIGH (ref <150)
VLDL Size: 44.6 nm (ref >=46.6)

## 2012-05-24 LAB — COMPLETE METABOLIC PANEL WITH GFR
ALT: 51 U/L — ABNORMAL HIGH (ref 0–35)
AST: 33 U/L (ref 0–37)
Albumin: 3.9 g/dL (ref 3.5–5.2)
Alkaline Phosphatase: 71 U/L (ref 39–117)
BUN: 10 mg/dL (ref 6–23)
CO2: 31 mEq/L (ref 19–32)
Calcium: 9.1 mg/dL (ref 8.4–10.5)
Chloride: 100 mEq/L (ref 96–112)
Creat: 0.75 mg/dL (ref 0.50–1.10)
GFR, Est African American: >89 mL/min
GFR, Est Non African American: 81 mL/min
Glucose, Bld: 102 mg/dL — ABNORMAL HIGH (ref 70–99)
Potassium: 3.8 mEq/L (ref 3.5–5.3)
Sodium: 137 mEq/L (ref 135–145)
Total Bilirubin: 0.6 mg/dL (ref 0.3–1.2)
Total Protein: 6.1 g/dL (ref 6.0–8.3)

## 2012-05-24 NOTE — Progress Notes (Signed)
Quick Note:  Lab result at goal.Triglycerides a little high. Reduce the sugars and Sweets and simple carbs. No change in Medications for now. ______

## 2012-05-25 ENCOUNTER — Other Ambulatory Visit: Payer: Self-pay | Admitting: Family Medicine

## 2012-05-25 ENCOUNTER — Encounter: Payer: Self-pay | Admitting: *Deleted

## 2012-05-26 ENCOUNTER — Telehealth: Payer: Self-pay | Admitting: Family Medicine

## 2012-05-26 NOTE — Telephone Encounter (Signed)
Spoke with debbie (daughter) and per dr [place] Charon if pt experiences hallucinations or behavioral episodes it is ok to give 1 extra xanax, she stated she would call Shon Hale and explain to him the medication. Reinforced importance of keeping the diary

## 2012-06-06 ENCOUNTER — Telehealth: Payer: Self-pay | Admitting: Family Medicine

## 2012-06-06 NOTE — Telephone Encounter (Signed)
Spouse stated his wife is doing ok and has not noticed any increase in behavior or any suicidal ideations Stated he does not know who tammy johnson

## 2012-06-06 NOTE — Telephone Encounter (Signed)
Spoke with Shon Hale and he stated he does not know "Mackey Birchwood" # [phone]

## 2012-06-08 ENCOUNTER — Telehealth: Payer: Self-pay | Admitting: *Deleted

## 2012-06-08 NOTE — Telephone Encounter (Signed)
Spoke with Sarah Skinner, her daughter, she reports that she seems in pretty good spirits recently.  She hasn't heard any suicidal comments from her and [facility] hasn't contacted her about this episode.  Sarah Skinner has a Engineer, civil (consulting) around the clock that stays with her.  Sarah Skinner will also be with her today and tomorrow.  She will speak with her mother and her nurse about this and will call back if necessary.  She agreed to take her to the ED if she expresses any suicidal feelings. Sarah Skinner said that Sarah Skinner gets frustrated and depressed when she can't do the things she wants to do.    Sarah Skinner would like to see if there is anything else they can do for her depression.  She is taking Lexapro, Xanax, and Ambien currently.

## 2012-06-08 NOTE — Telephone Encounter (Signed)
The [facility] is reporting to [facility] that Sarah Skinner has made the statement that if she had a gun she would kill herself.  She wanted to see if we could see her sooner than her next scheduled appt which is 06/19/12. They also wanted to clarify her medications.     I feel that if this is a current threat then an ED visit would be appropriate.  That way they could evaluate and hospitalize her if necessary.

## 2012-06-09 NOTE — Telephone Encounter (Signed)
agree

## 2012-06-11 ENCOUNTER — Emergency Department (HOSPITAL_COMMUNITY): Payer: Medicare Other

## 2012-06-11 ENCOUNTER — Emergency Department (HOSPITAL_COMMUNITY)
Admission: EM | Admit: 2012-06-11 | Discharge: 2012-06-11 | Disposition: A | Discharge status: Home / Self Care | Payer: Medicare Other | Attending: Emergency Medicine | Admitting: Emergency Medicine

## 2012-06-11 ENCOUNTER — Encounter (HOSPITAL_COMMUNITY): Payer: Self-pay | Admitting: *Deleted

## 2012-06-11 DIAGNOSIS — E039 Hypothyroidism, unspecified: Secondary | ICD-10-CM | POA: Insufficient documentation

## 2012-06-11 DIAGNOSIS — R112 Nausea with vomiting, unspecified: Secondary | ICD-10-CM | POA: Insufficient documentation

## 2012-06-11 DIAGNOSIS — F411 Generalized anxiety disorder: Secondary | ICD-10-CM | POA: Insufficient documentation

## 2012-06-11 DIAGNOSIS — Z8673 Personal history of transient ischemic attack (TIA), and cerebral infarction without residual deficits: Secondary | ICD-10-CM | POA: Insufficient documentation

## 2012-06-11 DIAGNOSIS — Z8679 Personal history of other diseases of the circulatory system: Secondary | ICD-10-CM | POA: Insufficient documentation

## 2012-06-11 DIAGNOSIS — Z90710 Acquired absence of both cervix and uterus: Secondary | ICD-10-CM | POA: Insufficient documentation

## 2012-06-11 DIAGNOSIS — Z87448 Personal history of other diseases of urinary system: Secondary | ICD-10-CM | POA: Insufficient documentation

## 2012-06-11 DIAGNOSIS — Z9889 Other specified postprocedural states: Secondary | ICD-10-CM | POA: Insufficient documentation

## 2012-06-11 DIAGNOSIS — I10 Essential (primary) hypertension: Secondary | ICD-10-CM | POA: Insufficient documentation

## 2012-06-11 DIAGNOSIS — R109 Unspecified abdominal pain: Secondary | ICD-10-CM | POA: Insufficient documentation

## 2012-06-11 DIAGNOSIS — Z79899 Other long term (current) drug therapy: Secondary | ICD-10-CM | POA: Insufficient documentation

## 2012-06-11 DIAGNOSIS — E119 Type 2 diabetes mellitus without complications: Secondary | ICD-10-CM | POA: Insufficient documentation

## 2012-06-11 DIAGNOSIS — Z794 Long term (current) use of insulin: Secondary | ICD-10-CM | POA: Insufficient documentation

## 2012-06-11 DIAGNOSIS — Z87891 Personal history of nicotine dependence: Secondary | ICD-10-CM | POA: Insufficient documentation

## 2012-06-11 DIAGNOSIS — Z8669 Personal history of other diseases of the nervous system and sense organs: Secondary | ICD-10-CM | POA: Insufficient documentation

## 2012-06-11 DIAGNOSIS — K219 Gastro-esophageal reflux disease without esophagitis: Secondary | ICD-10-CM | POA: Insufficient documentation

## 2012-06-11 DIAGNOSIS — Z9851 Tubal ligation status: Secondary | ICD-10-CM | POA: Insufficient documentation

## 2012-06-11 DIAGNOSIS — Z9089 Acquired absence of other organs: Secondary | ICD-10-CM | POA: Insufficient documentation

## 2012-06-11 DIAGNOSIS — Z8739 Personal history of other diseases of the musculoskeletal system and connective tissue: Secondary | ICD-10-CM | POA: Insufficient documentation

## 2012-06-11 LAB — CBC WITH DIFFERENTIAL/PLATELET
Eosinophils Relative: 0 % (ref 0–5)
Hemoglobin: 15.2 g/dL — ABNORMAL HIGH (ref 12.0–15.0)
Lymphocytes Relative: 6 % — ABNORMAL LOW (ref 12–46)
Lymphs Abs: 0.9 10*3/uL (ref 0.7–4.0)
MCV: 89.7 fL (ref 78.0–100.0)
Monocytes Relative: 8 % (ref 3–12)
Platelets: 207 10*3/uL (ref 150–400)
RBC: 4.85 MIL/uL (ref 3.87–5.11)
WBC: 15.1 10*3/uL — ABNORMAL HIGH (ref 4.0–10.5)

## 2012-06-11 LAB — COMPREHENSIVE METABOLIC PANEL
ALT: 51 U/L — ABNORMAL HIGH (ref 0–35)
Alkaline Phosphatase: 81 U/L (ref 39–117)
CO2: 30 mEq/L (ref 19–32)
GFR calc Af Amer: >90 mL/min (ref >90)
GFR calc non Af Amer: 82 mL/min — ABNORMAL LOW (ref >90)
Glucose, Bld: 148 mg/dL — ABNORMAL HIGH (ref 70–99)
Potassium: 4.6 mEq/L (ref 3.5–5.1)
Sodium: 136 mEq/L (ref 135–145)

## 2012-06-11 LAB — URINALYSIS, ROUTINE W REFLEX MICROSCOPIC
Bilirubin Urine: NEGATIVE
Nitrite: NEGATIVE
Protein, ur: 30 mg/dL — AB
Specific Gravity, Urine: 1.021 (ref 1.005–1.030)
Urobilinogen, UA: 0.2 mg/dL (ref 0.0–1.0)

## 2012-06-11 LAB — URINE MICROSCOPIC-ADD ON

## 2012-06-11 LAB — TROPONIN I: Troponin I: <0.3 ng/mL (ref <0.30)

## 2012-06-11 MED ORDER — ONDANSETRON HCL 4 MG/2ML IJ SOLN
4.0000 mg | Freq: Once | INTRAMUSCULAR | Status: AC
  Administered 2012-06-11: 4 mg via INTRAVENOUS
  Filled 2012-06-11: qty 2

## 2012-06-11 MED ORDER — IOHEXOL 300 MG/ML  SOLN
100.0000 mL | Freq: Once | INTRAMUSCULAR | Status: AC | PRN
  Administered 2012-06-11: 100 mL via INTRAVENOUS

## 2012-06-11 MED ORDER — ONDANSETRON HCL 4 MG/2ML IJ SOLN
4.0000 mg | Freq: Four times a day (QID) | INTRAMUSCULAR | Status: DC | PRN
  Administered 2012-06-11: 4 mg via INTRAVENOUS
  Filled 2012-06-11: qty 2

## 2012-06-11 MED ORDER — MORPHINE SULFATE 4 MG/ML IJ SOLN
4.0000 mg | Freq: Once | INTRAMUSCULAR | Status: AC
  Administered 2012-06-11: 4 mg via INTRAVENOUS
  Filled 2012-06-11: qty 1

## 2012-06-11 MED ORDER — IOHEXOL 300 MG/ML  SOLN
50.0000 mL | Freq: Once | INTRAMUSCULAR | Status: AC | PRN
  Administered 2012-06-11: 50 mL via ORAL

## 2012-06-11 MED ORDER — SODIUM CHLORIDE 0.9 % IV SOLN
Freq: Once | INTRAVENOUS | Status: AC
  Administered 2012-06-11: 13:00:00 via INTRAVENOUS

## 2012-06-11 MED ORDER — MORPHINE SULFATE 4 MG/ML IJ SOLN: 4.0000 mg | INTRAMUSCULAR | Status: DC | PRN

## 2012-06-11 NOTE — ED Notes (Signed)
md alerted pt is nauseas

## 2012-06-11 NOTE — ED Notes (Signed)
Per ems pt is from home. Pt c/o of abdominal pain 6/10, woke up this morning around 0800 felt fine. Ate cheerios 0830, took estimated 13 medicines and then vomited, home health nurse reports pt passed large bowel movement, and then began vomiting again.

## 2012-06-11 NOTE — ED Notes (Signed)
[facility] called for transport 

## 2012-06-11 NOTE — ED Notes (Signed)
[facility] arrived

## 2012-06-11 NOTE — ED Notes (Signed)
JWJ:[id number]<NW> Expected date:06/11/12<BR> Expected time:12:17 PM<BR> Means of arrival:Ambulance<BR> Comments:<BR> Gen Weakness

## 2012-06-11 NOTE — ED Provider Notes (Signed)
History     CSN: [id number]  Arrival date & time 06/11/12  1215   First MD Initiated Contact with Patient 06/11/12 1236      Chief Complaint  Patient presents with  . Abdominal Pain    (Consider location/radiation/quality/duration/timing/severity/associated sxs/prior treatment) HPI Comments: Pt with hx of iddm, htn comes in with cc of abd pain. Pt has hx of cholecystectomy and appendectomy. Pt states that she started having epigastric abd pain after breakfast, with some nausea and emesis,. The pain is sharp, non radiating, and is located periumbilically , and in the upper quadrants. Pt has no chest pain, dib. Emesis x 5 times - non bilious and non bloody. Last BM was today.   Patient is a 72 y.o. female presenting with abdominal pain. The history is provided by the patient.  Abdominal Pain Associated symptoms: nausea and vomiting   Associated symptoms: no chest pain, no constipation, no cough, no diarrhea, no hematuria and no shortness of breath     Past Medical History  Diagnosis Date  . Hypertension   . Diabetes mellitus   . Hypothyroidism   . Blood transfusion     hx of with childbirth   . Stress incontinence     stress incontinence   . GERD (gastroesophageal reflux disease)   . Headache     hx of   . Fibromyalgia   . Anxiety   . Arthritis     hands, feet , knees   . Stroke     left sided weakness   . Anginal pain     not currently seeing cardiologist    Past Surgical History  Procedure Laterality Date  . Abdominal hysterectomy    . Cholecystectomy    . Rectocele repair    . Cervical fusion    . Tubal ligation    . Wisdom tooth extraction    . Cystoscopy with injection  05/18/2011    Procedure: CYSTOSCOPY WITH INJECTION;  Surgeon: Anner Crete, MD;  Location: [facility];  Service: Urology;  Laterality: N/A;  Injection of Macroplastique  . Flexible sigmoidoscopy  08/18/2011    Procedure: FLEXIBLE SIGMOIDOSCOPY;  Surgeon: Charna Elizabeth, MD;  Location: [facility];   Service: Endoscopy;  Laterality: N/A;    Family History  Problem Relation Age of Onset  . Heart failure Mother   . COPD Father   . Diabetes Sister   . Heart failure Sister     History  Substance Use Topics  . Smoking status: Former Smoker -- 2.00 packs/day for 20 years    Types: Cigarettes    Quit date: 02/23/1991  . Smokeless tobacco: Never Used     Comment: Quite 20 Yrs Ago  . Alcohol Use: No     Comment: hx of no longer uses     OB History   Grav Para Term Preterm Abortions TAB SAB Ect Mult Living                  Review of Systems  Constitutional: Negative for activity change.  HENT: Negative for facial swelling and neck pain.   Respiratory: Negative for cough, shortness of breath and wheezing.   Cardiovascular: Negative for chest pain.  Gastrointestinal: Positive for nausea, vomiting and abdominal pain. Negative for diarrhea, constipation, blood in stool and abdominal distention.  Genitourinary: Negative for hematuria and difficulty urinating.  Skin: Negative for color change.  Neurological: Negative for speech difficulty.  Hematological: Does not bruise/bleed easily.  Psychiatric/Behavioral: Negative for confusion.  Allergies  Aspirin; Penicillins; Sulfa antibiotics; and Latex  Home Medications   Current Outpatient Rx  Name  Route  Sig  Dispense  Refill  . ALPRAZolam (XANAX) 0.5 MG tablet   Oral   Take 0.5 mg by mouth 3 (three) times daily as needed for sleep.         Marland Kitchen amLODipine (NORVASC) 5 MG tablet   Oral   Take 5 mg by mouth every morning.         . cholecalciferol (VITAMIN D) 1000 UNITS tablet   Oral   Take 1,000 Units by mouth every morning.          . diclofenac (VOLTAREN) 50 MG EC tablet   Oral   Take 1 tablet (50 mg total) by mouth 2 (two) times daily.   60 tablet   5   . docusate sodium (COLACE) 100 MG capsule   Oral   Take 200 mg by mouth 2 (two) times daily.         Marland Kitchen escitalopram (LEXAPRO) 20 MG tablet   Oral   Take  20 mg by mouth every morning.          . insulin aspart (NOVOLOG) 100 UNIT/ML injection   Subcutaneous   Inject 3 Units into the skin 3 (three) times daily before meals. Injects 3 units every morning, dinner (noon), and supper.         . insulin glargine (LANTUS) 100 UNIT/ML injection   Subcutaneous   Inject 31 Units into the skin at bedtime.          . Lactobacillus Rhamnosus, GG, (CULTURELLE PO)   Oral   Take 1 tablet by mouth daily.         Marland Kitchen levothyroxine (SYNTHROID, LEVOTHROID) 50 MCG tablet   Oral   Take 50 mcg by mouth AC breakfast.          . loratadine (CLARITIN) 10 MG tablet   Oral   Take 10 mg by mouth daily.          . metoprolol (LOPRESSOR) 50 MG tablet   Oral   Take 1 tablet (50 mg total) by mouth 2 (two) times daily.   60 tablet   5   . mirabegron ER (MYRBETRIQ) 25 MG TB24   Oral   Take 25 mg by mouth daily.         . Multiple Vitamins-Minerals (MULTIVITAMIN & MINERAL PO)   Oral   Take 1 tablet by mouth daily with lunch. Multivitamin with calcium 1000mg , magnesium 400mg  and zinc 15 mg per daughter.         . Omega-3 Fatty Acids (FISH OIL) 1000 MG CAPS   Oral   Take 1 capsule by mouth 4 (four) times daily.          . pantoprazole (PROTONIX) 40 MG tablet   Oral   Take 40 mg by mouth daily before breakfast.          . potassium chloride SA (K-DUR,KLOR-CON) 20 MEQ tablet   Oral   Take 20 mEq by mouth daily with lunch.          . rosuvastatin (CRESTOR) 10 MG tablet   Oral   Take 10 mg by mouth every other day.         . zolpidem (AMBIEN) 5 MG tablet   Oral   Take 1 tablet (5 mg total) by mouth at bedtime.   30 tablet   2     BP  177/75  Pulse 87  Temp(Src) 97.8 F (36.6 C) (Oral)  Resp 16  SpO2 97%  Physical Exam  Nursing note and vitals reviewed. Constitutional: She is oriented to person, place, and time. She appears well-developed and well-nourished.  HENT:  Head: Normocephalic and atraumatic.  Eyes: EOM are  normal. Pupils are equal, round, and reactive to light.  Neck: Neck supple.  Cardiovascular: Normal rate, regular rhythm and normal heart sounds.   No murmur heard. Pulmonary/Chest: Effort normal. No respiratory distress.  Abdominal: Soft. She exhibits no distension and no mass. There is tenderness. There is no rebound and no guarding.  Upper quadrants and periumbilical tenderness, with no peritoneal signs.  Neurological: She is alert and oriented to person, place, and time.  Skin: Skin is warm and dry.    ED Course  Procedures (including critical care time)  Labs Reviewed  CBC WITH DIFFERENTIAL - Abnormal; Notable for the following:    WBC 15.1 (*)    Hemoglobin 15.2 (*)    Neutrophils Relative 86 (*)    Neutro Abs 13.0 (*)    Lymphocytes Relative 6 (*)    Monocytes Absolute 1.1 (*)    All other components within normal limits  COMPREHENSIVE METABOLIC PANEL - Abnormal; Notable for the following:    Glucose, Bld 148 (*)    AST 38 (*)    ALT 51 (*)    GFR calc non Af Amer 82 (*)    All other components within normal limits  GLUCOSE, CAPILLARY - Abnormal; Notable for the following:    Glucose-Capillary 126 (*)    All other components within normal limits  URINALYSIS, ROUTINE W REFLEX MICROSCOPIC - Abnormal; Notable for the following:    APPearance CLOUDY (*)    Protein, ur 30 (*)    Leukocytes, UA SMALL (*)    All other components within normal limits  URINE MICROSCOPIC-ADD ON - Abnormal; Notable for the following:    Bacteria, UA FEW (*)    All other components within normal limits  URINE CULTURE  LIPASE, BLOOD  TROPONIN I   Dg Abd Acute W/chest  06/11/2012  *RADIOLOGY REPORT*  Clinical Data: Abdominal pain, nausea.  ACUTE ABDOMEN SERIES (ABDOMEN 2 VIEW & CHEST 1 VIEW)  Comparison: 05/16/2012  Findings: Mild cardiomegaly.  Lungs are clear.  No effusions or edema.  Prior cholecystectomy. There is normal bowel gas pattern.  No free air.  No organomegaly or suspicious  calcification.  No acute bony abnormality.  No acute bony abnormality.  IMPRESSION: Mild cardiomegaly.  No evidence of bowel obstruction or free air.  Prior cholecystectomy.   Original Report Authenticated By: Charlett Nose, M.D.      No diagnosis found.    MDM  DDx includes: Pancreatitis Hepatobiliary pathology including cholecystitis Gastritis/PUD SBO ACS syndrome Aortic Dissection  Pt comes in with cc of Abd pain, nasuea, emesis.   Date: 06/11/2012  Rate: 89  Rhythm: normal sinus rhythm  QRS Axis: normal  Intervals: normal  ST/T Wave abnormalities: nonspecific ST/T changes  Conduction Disutrbances:none  Narrative Interpretation:   Old EKG Reviewed: unchanged  Pt comes in with cc of abd pain. Pt has hx of iddm, and has new onset upper quadrant abd pain. Pt is s/p cholecystectomy and s/p appendectomy. Non peritoneal exams. Will get AAS to evaluate for possible SBO. Likely to require CT.  3:40 PM Pt has leukocytosis. CT ordered. Dr. Preston Fleeting to follow up on results.      Derwood Kaplan, MD 06/11/12 1540

## 2012-06-11 NOTE — ED Provider Notes (Signed)
Care assumed from Dr.Nanavati. CT has come back unremarkable. Patient was reevaluated. She states that her abdominal pain is improved significantly. Abdomen is noted to be soft with very minimal tenderness. She is discharged to a but advised to return should symptoms worsen.  Dg Chest 2 View  05/16/2012  *RADIOLOGY REPORT*  Clinical Data: Foreign body in the respiratory tract  CHEST - 2 VIEW  Comparison: February 04, 2012.  Findings:  There is no edema or consolidation.  There is minimal atelectasis in the left base.  No radiopaque foreign bodies seen.  Heart is upper normal in size with normal pulmonary vascularity. No adenopathy.  There is extensive atherosclerotic change in the aorta.  IMPRESSION: No edema or consolidation.  No radiopaque foreign body seen. Remains concern for potential foreign body in the respiratory tract, noncontrast enhanced chest CT would be advisable to further assess.  There is extensive atherosclerotic change throughout the aorta.   Original Report Authenticated By: Bretta Bang, M.D.    Ct Abdomen Pelvis W Contrast  06/11/2012  *RADIOLOGY REPORT*  Clinical Data: Abdominal pain and elevated white blood cell count.  CT ABDOMEN AND PELVIS WITH CONTRAST  Technique:  Multidetector CT imaging of the abdomen and pelvis was performed following the standard protocol during bolus administration of intravenous contrast.  Contrast: OMNIPAQUE IOHEXOL 300 MG/ML  SOLN  Comparison: 08/17/2011.  Findings: The lung bases are clear except for dependent atelectasis.  Mild cardiac enlargement but no pericardial effusion. Dense aortic calcifications are noted.  The liver is unremarkable.  No focal hepatic lesions or intrahepatic biliary dilatation.  An elongated left hepatic lobe is noted.  There is one dot of air new left portal vein which is most likely in the biliary tree and due to prior cholecystectomy and sphincterotomy.  No common bile duct dilatation.  The portal and hepatic veins are  patent.  The pancreas is normal.  The spleen is normal.  The adrenal glands and kidneys are normal.  The stomach, duodenum, small bowel and colon are unremarkable.  No inflammatory changes, obstruction or mass lesions.  No findings for diverticulitis.  The appendix is surgically absent.  No mesenteric or retroperitoneal mass or adenopathy.  Dense aortic and branch vessel calcifications without focal aneurysm or dissection.  There are scattered mesenteric and retroperitoneal lymph nodes but no mass or adenopathy.  The bladder is unremarkable.  The uterus is surgically absent. Both ovaries are still present and appear normal.  No pelvic mass, adenopathy or free pelvic fluid collections.  No inguinal mass or hernia.  Examination bony structures demonstrates no significant abnormalities.  There are bilateral pars defects at L5.  IMPRESSION:  1.  No acute abdominal/pelvic findings, mass lesions or adenopathy. 2.  Advanced atherosclerotic calcifications involving the major vascular structures.   Original Report Authenticated By: Rudie Meyer, M.D.        Dione Booze, MD 06/11/12 [phone]

## 2012-06-12 ENCOUNTER — Telehealth: Payer: Self-pay

## 2012-06-12 DIAGNOSIS — I639 Cerebral infarction, unspecified: Secondary | ICD-10-CM

## 2012-06-12 NOTE — Telephone Encounter (Signed)
Per Dr Modesto Charon pt went to [facility] and spouse called about confused on how to give her meds and per dr Modesto Charon he wants referreal for [facility] to assist pt and pt's family.

## 2012-06-12 NOTE — Telephone Encounter (Signed)
SPOUSE AWARE WILL BE CONTACTING JULIE FARMER TO [facility] HOME VISIT /MED MANAGEMENT PT VERBALIZED UNDERSTANDING

## 2012-06-13 LAB — URINE CULTURE

## 2012-06-15 ENCOUNTER — Other Ambulatory Visit: Payer: Self-pay | Admitting: Family Medicine

## 2012-06-19 ENCOUNTER — Ambulatory Visit (INDEPENDENT_AMBULATORY_CARE_PROVIDER_SITE_OTHER): Payer: PRIVATE HEALTH INSURANCE | Admitting: Family Medicine

## 2012-06-19 ENCOUNTER — Encounter: Payer: Self-pay | Admitting: Family Medicine

## 2012-06-19 VITALS — BP 156/81 | HR 69 | Temp 97.5°F

## 2012-06-19 DIAGNOSIS — I6350 Cerebral infarction due to unspecified occlusion or stenosis of unspecified cerebral artery: Secondary | ICD-10-CM

## 2012-06-19 DIAGNOSIS — E876 Hypokalemia: Secondary | ICD-10-CM

## 2012-06-19 DIAGNOSIS — E871 Hypo-osmolality and hyponatremia: Secondary | ICD-10-CM

## 2012-06-19 DIAGNOSIS — F0391 Unspecified dementia with behavioral disturbance: Secondary | ICD-10-CM

## 2012-06-19 DIAGNOSIS — I639 Cerebral infarction, unspecified: Secondary | ICD-10-CM

## 2012-06-19 DIAGNOSIS — F03918 Unspecified dementia, unspecified severity, with other behavioral disturbance: Secondary | ICD-10-CM

## 2012-06-19 DIAGNOSIS — I10 Essential (primary) hypertension: Secondary | ICD-10-CM

## 2012-06-19 DIAGNOSIS — E119 Type 2 diabetes mellitus without complications: Secondary | ICD-10-CM

## 2012-06-19 NOTE — Progress Notes (Signed)
Subjective:     Patient ID: Sarah Skinner, female   DOB: 06-27-1940, 72 y.o.   MRN: [id number]  HPI Here for follow up. Has to use Xanax three times a  Day to control her agitation.  Past Medical History  Diagnosis Date  . Hypertension   . Diabetes mellitus   . Hypothyroidism   . Blood transfusion     hx of with childbirth   . Stress incontinence     stress incontinence   . GERD (gastroesophageal reflux disease)   . Headache     hx of   . Fibromyalgia   . Anxiety   . Arthritis     hands, feet , knees   . Stroke     left sided weakness   . Anginal pain     not currently seeing cardiologist   Past Surgical History  Procedure Laterality Date  . Abdominal hysterectomy    . Cholecystectomy    . Rectocele repair    . Cervical fusion    . Tubal ligation    . Wisdom tooth extraction    . Cystoscopy with injection  05/18/2011    Procedure: CYSTOSCOPY WITH INJECTION;  Surgeon: Anner Crete, MD;  Location: [facility] ORS;  Service: Urology;  Laterality: N/A;  Injection of Macroplastique  . Flexible sigmoidoscopy  08/18/2011    Procedure: FLEXIBLE SIGMOIDOSCOPY;  Surgeon: Charna Elizabeth, MD;  Location: [facility] ENDOSCOPY;  Service: Endoscopy;  Laterality: N/A;   History   Social History  . Marital Status: Married    Spouse Name: Shon Hale    Number of Children: 6  . Years of Education: 12   Occupational History  . Retired IT sales professional    Social History Main Topics  . Smoking status: Former Smoker -- 2.00 packs/day for 20 years    Types: Cigarettes    Quit date: 02/23/1991  . Smokeless tobacco: Never Used     Comment: Quite 20 Yrs Ago  . Alcohol Use: No     Comment: hx of no longer uses   . Drug Use: No  . Sexually Active: No   Other Topics Concern  . Not on file   Social History Narrative   Married.  Lives in [place] with her husband.  Wheelchair bound.  Transfers with assistance.  Non-ambulatory.   Family History  Problem Relation Age of Onset  . Heart failure Mother   .  COPD Father   . Diabetes Sister   . Heart failure Sister    Current Outpatient Prescriptions on File Prior to Visit  Medication Sig Dispense Refill  . ALPRAZolam (XANAX) 0.5 MG tablet Take 0.5 mg by mouth 3 (three) times daily as needed for sleep.      Marland Kitchen amLODipine (NORVASC) 5 MG tablet Take 5 mg by mouth every morning.      . cholecalciferol (VITAMIN D) 1000 UNITS tablet Take 1,000 Units by mouth every morning.       . diclofenac (VOLTAREN) 50 MG EC tablet Take 1 tablet (50 mg total) by mouth 2 (two) times daily.  60 tablet  5  . docusate sodium (COLACE) 100 MG capsule Take 200 mg by mouth 2 (two) times daily.      Marland Kitchen escitalopram (LEXAPRO) 20 MG tablet Take 20 mg by mouth every morning.       . insulin aspart (NOVOLOG) 100 UNIT/ML injection Inject 3 Units into the skin 3 (three) times daily before meals. Injects 3 units every morning, dinner (noon), and supper.      Marland Kitchen  insulin glargine (LANTUS) 100 UNIT/ML injection Inject 31 Units into the skin at bedtime.       . Lactobacillus Rhamnosus, GG, (CULTURELLE PO) Take 1 tablet by mouth daily.      Marland Kitchen levothyroxine (SYNTHROID, LEVOTHROID) 50 MCG tablet Take 50 mcg by mouth AC breakfast.       . loratadine (CLARITIN) 10 MG tablet Take 10 mg by mouth daily.       . metoprolol (LOPRESSOR) 50 MG tablet Take 1 tablet (50 mg total) by mouth 2 (two) times daily.  60 tablet  5  . mirabegron ER (MYRBETRIQ) 25 MG TB24 Take 25 mg by mouth daily.      . Multiple Vitamins-Minerals (MULTIVITAMIN & MINERAL PO) Take 1 tablet by mouth daily with lunch. Multivitamin with calcium 1000mg , magnesium 400mg  and zinc 15 mg per daughter.      . Omega-3 Fatty Acids (FISH OIL) 1000 MG CAPS Take 1 capsule by mouth 3 (three) times daily.       . pantoprazole (PROTONIX) 40 MG tablet Take 40 mg by mouth daily before breakfast.       . rosuvastatin (CRESTOR) 10 MG tablet Take 10 mg by mouth every other day.      . zolpidem (AMBIEN) 5 MG tablet Take 1 tablet (5 mg total) by mouth  at bedtime.  30 tablet  2  . polyethylene glycol (MIRALAX / GLYCOLAX) packet Take 17 g by mouth daily.      . potassium chloride SA (K-DUR,KLOR-CON) 20 MEQ tablet Take 20 mEq by mouth daily with lunch.       . [DISCONTINUED] fexofenadine (ALLEGRA) 180 MG tablet Take 180 mg by mouth every morning.        No current facility-administered medications on file prior to visit.   Allergies  Allergen Reactions  . Aspirin     Red splotches all over body   . Penicillins Hives    Red splotches all over body  . Sulfa Antibiotics Hives    Red splotches all over body  . Latex Rash   There is no immunization history for the selected administration types on file for this patient. Prior to Admission medications   Medication Sig Start Date End Date Taking? Authorizing Provider  ALPRAZolam Prudy Feeler) 0.5 MG tablet Take 0.5 mg by mouth 3 (three) times daily as needed for sleep.   Yes Historical Provider, MD  amLODipine (NORVASC) 5 MG tablet Take 5 mg by mouth every morning.   Yes Historical Provider, MD  cholecalciferol (VITAMIN D) 1000 UNITS tablet Take 1,000 Units by mouth every morning.    Yes Historical Provider, MD  diclofenac (VOLTAREN) 50 MG EC tablet Take 1 tablet (50 mg total) by mouth 2 (two) times daily. 05/23/12  Yes Ileana Ladd, MD  docusate sodium (COLACE) 100 MG capsule Take 200 mg by mouth 2 (two) times daily. 08/20/11  Yes Alison Murray, MD  escitalopram (LEXAPRO) 20 MG tablet Take 20 mg by mouth every morning.    Yes Historical Provider, MD  FREESTYLE LITE test strip  05/27/12  Yes Historical Provider, MD  insulin aspart (NOVOLOG) 100 UNIT/ML injection Inject 3 Units into the skin 3 (three) times daily before meals. Injects 3 units every morning, dinner (noon), and supper.   Yes Historical Provider, MD  insulin glargine (LANTUS) 100 UNIT/ML injection Inject 31 Units into the skin at bedtime.    Yes Historical Provider, MD  Insulin Syringe-Needle U-100 31G X 5/16" 0.3 ML MISC  05/23/12  Yes  Historical Provider, MD  Lactobacillus Rhamnosus, GG, (CULTURELLE PO) Take 1 tablet by mouth daily.   Yes Historical Provider, MD  levothyroxine (SYNTHROID, LEVOTHROID) 50 MCG tablet Take 50 mcg by mouth AC breakfast.    Yes Historical Provider, MD  loratadine (CLARITIN) 10 MG tablet Take 10 mg by mouth daily.    Yes Historical Provider, MD  metoprolol (LOPRESSOR) 50 MG tablet Take 1 tablet (50 mg total) by mouth 2 (two) times daily. 05/23/12  Yes Ileana Ladd, MD  mirabegron ER (MYRBETRIQ) 25 MG TB24 Take 25 mg by mouth daily.   Yes Historical Provider, MD  Multiple Vitamins-Minerals (MULTIVITAMIN & MINERAL PO) Take 1 tablet by mouth daily with lunch. Multivitamin with calcium 1000mg , magnesium 400mg  and zinc 15 mg per daughter.   Yes Historical Provider, MD  Omega-3 Fatty Acids (FISH OIL) 1000 MG CAPS Take 1 capsule by mouth 3 (three) times daily.    Yes Historical Provider, MD  pantoprazole (PROTONIX) 40 MG tablet Take 40 mg by mouth daily before breakfast.    Yes Historical Provider, MD  rosuvastatin (CRESTOR) 10 MG tablet Take 10 mg by mouth every other day.   Yes Historical Provider, MD  zolpidem (AMBIEN) 5 MG tablet Take 1 tablet (5 mg total) by mouth at bedtime. 05/23/12  Yes Ileana Ladd, MD  Lancets (FREESTYLE) lancets  05/23/12   Historical Provider, MD  polyethylene glycol (MIRALAX / GLYCOLAX) packet Take 17 g by mouth daily.    Historical Provider, MD  potassium chloride SA (K-DUR,KLOR-CON) 20 MEQ tablet Take 20 mEq by mouth daily with lunch.     Historical Provider, MD    Review of Systems  All other systems reviewed and are negative.       Objective:   Physical Exam APPEARANCE:  In wheelchair. Obese impatient. Patient in no acute distress.The patient appeared well nourished and normally developed. Acyanotic. Waist: VITAL SIGNS:BP 156/81  Pulse 69  Temp(Src) 97.5 F (36.4 C) (Oral)   SKIN: warm and  Dry without overt rashes, tattoos and scars  HEAD and Neck: without  JVD, Head and scalp: normal Eyes:No scleral icterus. Fundi normal, eye movements normal. Ears: Auricle normal, canal normal, Tympanic membranes normal, insufflation normal. Nose: normal Throat: normal Neck & thyroid: normal  CHEST & LUNGS: Chest wall: normal Lungs: Clear  CVS: Reveals the PMI to be normally located. Regular rhythm, First and Second Heart sounds are normal,  absence of murmurs, rubs or gallops.  Peripheral vasculature: Radial pulses: normal   ABDOMEN:  Appearance: normal Benign,, no organomegaly, no masses, no Abdominal Aortic enlargement. No Guarding , no rebound. No Bruits. Bowel sounds: normal  RECTAL: N/A GU: N/A  EXTREMETIES: nonedematous. Both Femoral and Pedal pulses are normal.   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     Assessment:     Dementia with behavioral disturbance  CVA (cerebral infarction)  DM2 (diabetes mellitus, type 2)  Hypertension  Hyponatremia  Hypokalemia      Plan:     Discussed options. More medications and  Sedation increases other complicating events. After counselling, we agree to tolerate her overly demanding complaints and  Demands.. May be time for a facility,but family prefers to take care of patient at home.  No change in meds.  Illyria Sobocinski P. Modesto Charon, M.D.

## 2012-06-20 ENCOUNTER — Telehealth: Payer: Self-pay | Admitting: *Deleted

## 2012-06-20 NOTE — Telephone Encounter (Signed)
Sarah Skinner at [facility] says pt. Is very depressed per depression screen. She is on lexapro 5 mg, could this be increased or changed? Also, pt is rubbing Curel lotion all over her vagina due to dryness, is there something she can use for this.

## 2012-06-20 NOTE — Telephone Encounter (Signed)
Please tell Sarah Skinner to increase the lexapro to 10 mg daily. And to use Replens in the vaginal area for the dryness. Thanks. FW

## 2012-06-21 ENCOUNTER — Other Ambulatory Visit: Payer: Self-pay | Admitting: *Deleted

## 2012-06-21 DIAGNOSIS — M069 Rheumatoid arthritis, unspecified: Secondary | ICD-10-CM

## 2012-06-21 DIAGNOSIS — I69939 Monoplegia of upper limb following unspecified cerebrovascular disease affecting unspecified side: Secondary | ICD-10-CM

## 2012-06-21 DIAGNOSIS — E119 Type 2 diabetes mellitus without complications: Secondary | ICD-10-CM

## 2012-06-21 DIAGNOSIS — I10 Essential (primary) hypertension: Secondary | ICD-10-CM

## 2012-06-21 NOTE — Telephone Encounter (Signed)
Called [facility] and told her of new orders. Called pts husband and gave him both new orders. New  lexapro order sent  to [facility]

## 2012-06-27 ENCOUNTER — Telehealth: Payer: Self-pay | Admitting: *Deleted

## 2012-06-27 NOTE — Telephone Encounter (Signed)
Dr. Modesto Charon, [facility] with [facility] gave [place] the wrong dose of Lexapro on her assessment, she was looking at an old med list. The patient is currently on Lexapro 20, we need to increase or change, very depressed

## 2012-06-28 ENCOUNTER — Other Ambulatory Visit: Payer: Self-pay | Admitting: Family Medicine

## 2012-06-28 DIAGNOSIS — F32A Depression, unspecified: Secondary | ICD-10-CM

## 2012-06-28 DIAGNOSIS — F329 Major depressive disorder, single episode, unspecified: Secondary | ICD-10-CM

## 2012-06-28 MED ORDER — ESCITALOPRAM OXALATE 20 MG PO TABS: 20.0000 mg | ORAL_TABLET | Freq: Every morning | ORAL | Status: DC

## 2012-06-28 NOTE — Telephone Encounter (Signed)
Rx for lexapro 20 mg made in [facility].

## 2012-06-28 NOTE — Telephone Encounter (Signed)
Debbie notified that rx sent to pharmacy

## 2012-07-03 ENCOUNTER — Ambulatory Visit (INDEPENDENT_AMBULATORY_CARE_PROVIDER_SITE_OTHER): Payer: PRIVATE HEALTH INSURANCE | Admitting: Physician Assistant

## 2012-07-03 ENCOUNTER — Encounter: Payer: Self-pay | Admitting: Physician Assistant

## 2012-07-03 ENCOUNTER — Telehealth: Payer: Self-pay | Admitting: Family Medicine

## 2012-07-03 VITALS — BP 122/57 | HR 74 | Temp 98.5°F

## 2012-07-03 DIAGNOSIS — L988 Other specified disorders of the skin and subcutaneous tissue: Secondary | ICD-10-CM

## 2012-07-03 DIAGNOSIS — R238 Other skin changes: Secondary | ICD-10-CM

## 2012-07-03 MED ORDER — VALACYCLOVIR HCL 1 G PO TABS: 1000.0000 mg | ORAL_TABLET | Freq: Two times a day (BID) | ORAL | Status: DC

## 2012-07-03 NOTE — Progress Notes (Signed)
Subjective:     Patient ID: Sarah Skinner, female   DOB: 02-10-1941, 72 y.o.   MRN: [id number]  HPI Pt with onset of blisters and pain to the 3rd toe of the R foot Hx of same ~ 6 months ago No drainage from site Prev culture with no growth  Review of Systems     Objective:   Physical Exam Mult resolving vesicles to the bottom of the R 3rd toe and at base of webbing No surrounding erythema or induration FROM toe No new vesicles     Assessment:     Toe pain- ? Herpetic lesion    Plan:     Valtrex rx Informed pt/family of S/S of infection Keep F/U with Dr Modesto Charon

## 2012-07-03 NOTE — Telephone Encounter (Signed)
APPT MADE

## 2012-07-11 ENCOUNTER — Telehealth: Payer: Self-pay | Admitting: *Deleted

## 2012-07-11 ENCOUNTER — Other Ambulatory Visit: Payer: Self-pay | Admitting: Family Medicine

## 2012-07-11 DIAGNOSIS — F329 Major depressive disorder, single episode, unspecified: Secondary | ICD-10-CM

## 2012-07-11 DIAGNOSIS — F32A Depression, unspecified: Secondary | ICD-10-CM

## 2012-07-11 MED ORDER — BUPROPION HCL ER (SR) 150 MG PO TB12: 150.0000 mg | ORAL_TABLET | Freq: Every day | ORAL | Status: DC

## 2012-07-11 NOTE — Telephone Encounter (Signed)
Still need to clarify Sarah Skinner depression issues, nothing has been done to increase or change dose or med.. Pt has been on Lexapro 20 for some time now.

## 2012-07-11 NOTE — Telephone Encounter (Signed)
Will add bupropion 150 mg daily . Ordered in [employer]

## 2012-07-12 NOTE — Telephone Encounter (Signed)
Please notify home health nurse

## 2012-07-14 ENCOUNTER — Other Ambulatory Visit: Payer: Self-pay | Admitting: Family Medicine

## 2012-07-14 ENCOUNTER — Telehealth: Payer: Self-pay | Admitting: *Deleted

## 2012-07-14 DIAGNOSIS — F32A Depression, unspecified: Secondary | ICD-10-CM

## 2012-07-14 DIAGNOSIS — F329 Major depressive disorder, single episode, unspecified: Secondary | ICD-10-CM

## 2012-07-14 NOTE — Telephone Encounter (Signed)
Sarah Skinner Shows with [facility] and the family believe a psych referral is needed. Please put in a referral on epic to Behavioral health in [place]

## 2012-07-14 NOTE — Telephone Encounter (Signed)
Daughter aware that new rx was called int

## 2012-07-14 NOTE — Telephone Encounter (Signed)
Referral done

## 2012-07-18 ENCOUNTER — Telehealth: Payer: Self-pay | Admitting: *Deleted

## 2012-07-18 DIAGNOSIS — I639 Cerebral infarction, unspecified: Secondary | ICD-10-CM

## 2012-07-18 DIAGNOSIS — F0391 Unspecified dementia with behavioral disturbance: Secondary | ICD-10-CM

## 2012-07-18 NOTE — Telephone Encounter (Signed)
[facility] and Familiy would like for home health to come in and help show family better transfer techniques and how to use equipment. Please put in a Ambulatory Home Health Referral for PT

## 2012-07-18 NOTE — Telephone Encounter (Signed)
Ok per dr Modesto Charon  Referral submitted

## 2012-07-19 NOTE — Telephone Encounter (Signed)
Debbi has referral

## 2012-07-20 NOTE — Telephone Encounter (Signed)
Referral made and sent to [facility]

## 2012-07-24 ENCOUNTER — Other Ambulatory Visit: Payer: Self-pay | Admitting: Family Medicine

## 2012-07-25 ENCOUNTER — Telehealth: Payer: Self-pay | Admitting: Family Medicine

## 2012-07-26 NOTE — Telephone Encounter (Signed)
Pt is seen a lot, have nurse call into [facility]

## 2012-08-01 ENCOUNTER — Other Ambulatory Visit: Payer: Self-pay | Admitting: Family Medicine

## 2012-08-17 ENCOUNTER — Other Ambulatory Visit: Payer: Self-pay | Admitting: Family Medicine

## 2012-08-18 NOTE — Telephone Encounter (Signed)
Duplicate request

## 2012-08-18 NOTE — Telephone Encounter (Signed)
Last seen in office on 07-03-12. Patient of [facility]. Rx last filled on 07-26-12. Please advise. If approved please have nurse phone in to pharmacy

## 2012-08-18 NOTE — Telephone Encounter (Signed)
Please call in xanax rx with 2 refills 

## 2012-08-21 DIAGNOSIS — I69939 Monoplegia of upper limb following unspecified cerebrovascular disease affecting unspecified side: Secondary | ICD-10-CM

## 2012-08-21 DIAGNOSIS — M069 Rheumatoid arthritis, unspecified: Secondary | ICD-10-CM

## 2012-08-21 DIAGNOSIS — E119 Type 2 diabetes mellitus without complications: Secondary | ICD-10-CM

## 2012-08-21 NOTE — Telephone Encounter (Signed)
Med called to pharm 

## 2012-09-12 ENCOUNTER — Other Ambulatory Visit: Payer: Self-pay | Admitting: Family Medicine

## 2012-09-16 ENCOUNTER — Other Ambulatory Visit: Payer: Self-pay | Admitting: Family Medicine

## 2012-09-18 ENCOUNTER — Other Ambulatory Visit: Payer: Self-pay | Admitting: Family Medicine

## 2012-09-19 NOTE — Telephone Encounter (Signed)
Needs appointment to clarify medications. She needs to bring all the medications  She is taking. There is confusion about the lotrel and losartan. And there is no record of Zoloft.

## 2012-09-19 NOTE — Telephone Encounter (Signed)
Call in please. Refill x 2. Thanks. Johnryan Sao P. Modesto Charon, M.D.

## 2012-09-19 NOTE — Telephone Encounter (Signed)
Need to ignore this note about office visit. It was for another chart. Is there any way to remove it. Thanks. Quintus Premo P. Modesto Charon, M.D.

## 2012-09-19 NOTE — Telephone Encounter (Signed)
Last filled 08/22/12. Route to pool A to be called into [facility]

## 2012-09-21 ENCOUNTER — Ambulatory Visit: Payer: PRIVATE HEALTH INSURANCE | Admitting: Family Medicine

## 2012-09-25 ENCOUNTER — Other Ambulatory Visit: Payer: Self-pay | Admitting: Family Medicine

## 2012-09-26 ENCOUNTER — Encounter (HOSPITAL_COMMUNITY): Payer: Self-pay

## 2012-09-26 ENCOUNTER — Emergency Department (HOSPITAL_COMMUNITY): Payer: Medicare Other

## 2012-09-26 ENCOUNTER — Emergency Department (HOSPITAL_COMMUNITY)
Admission: EM | Admit: 2012-09-26 | Discharge: 2012-09-26 | Disposition: A | Discharge status: Home / Self Care | Payer: Medicare Other | Attending: Emergency Medicine | Admitting: Emergency Medicine

## 2012-09-26 DIAGNOSIS — S20211A Contusion of right front wall of thorax, initial encounter: Secondary | ICD-10-CM

## 2012-09-26 DIAGNOSIS — Y939 Activity, unspecified: Secondary | ICD-10-CM | POA: Insufficient documentation

## 2012-09-26 DIAGNOSIS — K219 Gastro-esophageal reflux disease without esophagitis: Secondary | ICD-10-CM | POA: Insufficient documentation

## 2012-09-26 DIAGNOSIS — S8000XA Contusion of unspecified knee, initial encounter: Secondary | ICD-10-CM | POA: Insufficient documentation

## 2012-09-26 DIAGNOSIS — Z8742 Personal history of other diseases of the female genital tract: Secondary | ICD-10-CM | POA: Insufficient documentation

## 2012-09-26 DIAGNOSIS — Z87891 Personal history of nicotine dependence: Secondary | ICD-10-CM | POA: Insufficient documentation

## 2012-09-26 DIAGNOSIS — S8001XA Contusion of right knee, initial encounter: Secondary | ICD-10-CM

## 2012-09-26 DIAGNOSIS — Z880 Allergy status to penicillin: Secondary | ICD-10-CM | POA: Insufficient documentation

## 2012-09-26 DIAGNOSIS — Z79899 Other long term (current) drug therapy: Secondary | ICD-10-CM | POA: Insufficient documentation

## 2012-09-26 DIAGNOSIS — Z91040 Latex allergy status: Secondary | ICD-10-CM | POA: Insufficient documentation

## 2012-09-26 DIAGNOSIS — M129 Arthropathy, unspecified: Secondary | ICD-10-CM | POA: Insufficient documentation

## 2012-09-26 DIAGNOSIS — E119 Type 2 diabetes mellitus without complications: Secondary | ICD-10-CM | POA: Insufficient documentation

## 2012-09-26 DIAGNOSIS — F411 Generalized anxiety disorder: Secondary | ICD-10-CM | POA: Insufficient documentation

## 2012-09-26 DIAGNOSIS — I10 Essential (primary) hypertension: Secondary | ICD-10-CM | POA: Insufficient documentation

## 2012-09-26 DIAGNOSIS — W1809XA Striking against other object with subsequent fall, initial encounter: Secondary | ICD-10-CM | POA: Insufficient documentation

## 2012-09-26 DIAGNOSIS — Y929 Unspecified place or not applicable: Secondary | ICD-10-CM | POA: Insufficient documentation

## 2012-09-26 DIAGNOSIS — Z8673 Personal history of transient ischemic attack (TIA), and cerebral infarction without residual deficits: Secondary | ICD-10-CM | POA: Insufficient documentation

## 2012-09-26 DIAGNOSIS — S20219A Contusion of unspecified front wall of thorax, initial encounter: Secondary | ICD-10-CM | POA: Insufficient documentation

## 2012-09-26 DIAGNOSIS — Z8679 Personal history of other diseases of the circulatory system: Secondary | ICD-10-CM | POA: Insufficient documentation

## 2012-09-26 DIAGNOSIS — E039 Hypothyroidism, unspecified: Secondary | ICD-10-CM | POA: Insufficient documentation

## 2012-09-26 MED ORDER — HYDROCODONE-ACETAMINOPHEN 5-325 MG PO TABS: 1.0000 | ORAL_TABLET | ORAL | Status: DC | PRN

## 2012-09-26 NOTE — Discharge Instructions (Signed)
Chest Contusion  A chest contusion is a deep bruise on your chest area. Contusions are the result of an injury that caused bleeding under the skin. A chest contusion may involve bruising of the skin, muscles, or ribs. The contusion may turn blue, purple, or yellow. Minor injuries will give you a painless contusion, but more severe contusions may stay painful and swollen for a few weeks.  CAUSES   A contusion is usually caused by a blow, trauma, or direct force to an area of the body.  SYMPTOMS   · Swelling and redness of the injured area.  · Discoloration of the injured area.  · Tenderness and soreness of the injured area.  · Pain.  DIAGNOSIS   The diagnosis can be made by taking a history and performing a physical exam. An X-ray, CT scan, or MRI may be needed to determine if there were any associated injuries, such as broken bones (fractures) or internal injuries.  TREATMENT   Often, the best treatment for a chest contusion is resting, icing, and applying cold compresses to the injured area. Deep breathing exercises may be recommended to reduce the risk of pneumonia. Over-the-counter medicines may also be recommended for pain control.  HOME CARE INSTRUCTIONS   · Put ice on the injured area.  · Put ice in a plastic bag.  · Place a towel between your skin and the bag.  · Leave the ice on for 15-20 minutes, 3-4 times a day.  · Only take over-the-counter or prescription medicines as directed by your caregiver. Your caregiver may recommend avoiding anti-inflammatory medicines (aspirin, ibuprofen, and naproxen) for 48 hours because these medicines may increase bruising.  · Rest the injured area.  · Perform deep-breathing exercises as directed by your caregiver.  · Stop smoking if you smoke.  · Do not lift objects over 5 pounds (2.3 kg) for 3 days or longer if recommended by your caregiver.  SEEK IMMEDIATE MEDICAL CARE IF:   · You have increased bruising or swelling.  · You have pain that is getting worse.  · You have  difficulty breathing.  · You have dizziness, weakness, or fainting.  · You have blood in your urine or stool.  · You cough up or vomit blood.  · Your swelling or pain is not relieved with medicines.  MAKE SURE YOU:   · Understand these instructions.  · Will watch your condition.  · Will get help right away if you are not doing well or get worse.  Document Released: 11/03/2000 Document Revised: 11/03/2011 Document Reviewed: 08/02/2011  Exit[employer]® Patient Information ©2014 [employer].

## 2012-09-26 NOTE — ED Provider Notes (Signed)
CSN: [id number]     Arrival date & time 09/26/12  1201 History     First MD Initiated Contact with Patient 09/26/12 1209     Chief Complaint  Patient presents with  . Fall   (Consider location/radiation/quality/duration/timing/severity/associated sxs/prior Treatment) HPI Comments: Patient comes to the ER for evaluation of a knee injury. Patient reports that she was being transferred by her caretaker and fell, hitting the floor. She is complaining of right knee pain since the fall. She has also noticed some pain in the right posterior ribs. Pain is moderate at rest, worsened with movement. No shortness of breath. She denied a headache. No headache. Eyes denies neck pain.  Patient is a 72 y.o. female presenting with fall.  Fall    Past Medical History  Diagnosis Date  . Hypertension   . Diabetes mellitus   . Hypothyroidism   . Blood transfusion     hx of with childbirth   . Stress incontinence     stress incontinence   . GERD (gastroesophageal reflux disease)   . Headache(784.0)     hx of   . Fibromyalgia   . Anxiety   . Arthritis     hands, feet , knees   . Stroke     left sided weakness   . Anginal pain     not currently seeing cardiologist   Past Surgical History  Procedure Laterality Date  . Abdominal hysterectomy    . Cholecystectomy    . Rectocele repair    . Cervical fusion    . Tubal ligation    . Wisdom tooth extraction    . Cystoscopy with injection  05/18/2011    Procedure: CYSTOSCOPY WITH INJECTION;  Surgeon: Anner Crete, MD;  Location: [facility];  Service: Urology;  Laterality: N/A;  Injection of Macroplastique  . Flexible sigmoidoscopy  08/18/2011    Procedure: FLEXIBLE SIGMOIDOSCOPY;  Surgeon: Charna Elizabeth, MD;  Location: [facility];  Service: Endoscopy;  Laterality: N/A;   Family History  Problem Relation Age of Onset  . Heart failure Mother   . COPD Father   . Diabetes Sister   . Heart failure Sister    History  Substance Use Topics  . Smoking  status: Former Smoker -- 2.00 packs/day for 20 years    Types: Cigarettes    Quit date: 02/23/1991  . Smokeless tobacco: Never Used     Comment: Quite 20 Yrs Ago  . Alcohol Use: No     Comment: hx of no longer uses    OB History   Grav Para Term Preterm Abortions TAB SAB Ect Mult Living                 Review of Systems  HENT: Negative for neck pain.   Respiratory: Negative.   Cardiovascular: Negative.   Musculoskeletal: Positive for arthralgias.  All other systems reviewed and are negative.    Allergies  Aspirin; Penicillins; Sulfa antibiotics; and Latex  Home Medications   Current Outpatient Rx  Name  Route  Sig  Dispense  Refill  . ALPRAZolam (XANAX) 0.5 MG tablet      TAKE ONE TABLET BY MOUTH THREE TIMES DAILY AS NEEDED   100 tablet   2   . amLODipine (NORVASC) 5 MG tablet   Oral   Take 5 mg by mouth every morning.         Marland Kitchen buPROPion (WELLBUTRIN SR) 150 MG 12 hr tablet      TAKE ONE TABLET  BY MOUTH ONCE DAILY   30 tablet   0   . cholecalciferol (VITAMIN D) 1000 UNITS tablet   Oral   Take 1,000 Units by mouth every morning.          . diclofenac (VOLTAREN) 50 MG EC tablet   Oral   Take 1 tablet (50 mg total) by mouth 2 (two) times daily.   60 tablet   5   . docusate sodium (COLACE) 100 MG capsule   Oral   Take 200 mg by mouth 2 (two) times daily.         Marland Kitchen escitalopram (LEXAPRO) 20 MG tablet   Oral   Take 1 tablet (20 mg total) by mouth every morning.   30 tablet   5   . FREESTYLE LITE test strip      USE TO CHECK GLUCOSE 4 TIMES DAILY   100 each   2   . Insulin Syringe-Needle U-100 31G X 5/16" 0.3 ML MISC               . KLOR-CON M20 20 MEQ tablet      TAKE ONE TABLET BY MOUTH EVERY DAY   30 tablet   2   . Lactobacillus Rhamnosus, GG, (CULTURELLE PO)   Oral   Take 1 tablet by mouth daily.         . Lancets (FREESTYLE) lancets               . LANTUS 100 UNIT/ML injection      INJECT 31 UNITS AT BEDTIME   10  mL   1   . levothyroxine (SYNTHROID, LEVOTHROID) 50 MCG tablet   Oral   Take 50 mcg by mouth AC breakfast.          . loratadine (CLARITIN) 10 MG tablet   Oral   Take 10 mg by mouth daily.          . metoprolol (LOPRESSOR) 50 MG tablet   Oral   Take 1 tablet (50 mg total) by mouth 2 (two) times daily.   60 tablet   5   . mirabegron ER (MYRBETRIQ) 25 MG TB24   Oral   Take 25 mg by mouth daily.         . Multiple Vitamins-Minerals (MULTIVITAMIN & MINERAL PO)   Oral   Take 1 tablet by mouth daily with lunch. Multivitamin with calcium 1000mg , magnesium 400mg  and zinc 15 mg per daughter.         Marland Kitchen NOVOLOG 100 UNIT/ML injection      INJECT 3 UNITS BEFORE EACH MEAL   10 mL   1   . Omega-3 Fatty Acids (FISH OIL) 1000 MG CAPS   Oral   Take 1 capsule by mouth 3 (three) times daily.          . pantoprazole (PROTONIX) 40 MG tablet      TAKE ONE TABLET BY MOUTH EVERY DAY   30 tablet   5   . polyethylene glycol (MIRALAX / GLYCOLAX) packet   Oral   Take 17 g by mouth daily.         . rosuvastatin (CRESTOR) 10 MG tablet   Oral   Take 10 mg by mouth every other day.         . valACYclovir (VALTREX) 1000 MG tablet   Oral   Take 1 tablet (1,000 mg total) by mouth 2 (two) times daily.   20 tablet   0   .  zolpidem (AMBIEN) 5 MG tablet      TAKE ONE TABLET BY MOUTH AT BEDTIME   30 tablet   2    BP 155/65  Pulse 67  Temp(Src) 98.3 F (36.8 C) (Oral)  Resp 20  Ht 5\' 2"  (1.575 m)  Wt 195 lb (88.451 kg)  BMI 35.66 kg/m2  SpO2 100% Physical Exam  Constitutional: She is oriented to person, place, and time. She appears well-developed and well-nourished. No distress.  HENT:  Head: Normocephalic and atraumatic.  Right Ear: Hearing normal.  Left Ear: Hearing normal.  Nose: Nose normal.  Mouth/Throat: Oropharynx is clear and moist and mucous membranes are normal.  Eyes: Conjunctivae and EOM are normal. Pupils are equal, round, and reactive to light.  Neck:  Normal range of motion. Neck supple.  Cardiovascular: Regular rhythm, S1 normal and S2 normal.  Exam reveals no gallop and no friction rub.   No murmur heard. Pulmonary/Chest: Effort normal and breath sounds normal. No respiratory distress.   She exhibits no tenderness.  Abdominal: Soft. Normal appearance and bowel sounds are normal. There is no hepatosplenomegaly. There is no tenderness. There is no rebound, no guarding, no tenderness at McBurney's point and negative Murphy's sign. No hernia.  Musculoskeletal: Normal range of motion.       Right knee: She exhibits swelling. She exhibits no effusion, no ecchymosis and no deformity. Tenderness found.       Cervical back: Normal.       Thoracic back: Normal.       Lumbar back: Normal.  Neurological: She is alert and oriented to person, place, and time. No cranial nerve deficit or sensory deficit. She exhibits abnormal muscle tone. Coordination normal. GCS eye subscore is 4. GCS verbal subscore is 5. GCS motor subscore is 6.  Chronic left side weakness from CVA  Skin: Skin is warm, dry and intact. No rash noted. No cyanosis.  Psychiatric: She has a normal mood and affect. Her speech is normal and behavior is normal. Thought content normal.    ED Course   Procedures (including critical care time)  Labs Reviewed - No data to display Dg Ribs Unilateral W/chest Right  09/26/2012   *RADIOLOGY REPORT*  Clinical Data: Right-sided pain  RIGHT RIBS AND CHEST - 3+ VIEW  Comparison: 06/11/2012  Findings: No acute fracture is noted.  The lungs are well-aerated bilaterally.  The cardiac shadow is mildly enlarged but stable.  IMPRESSION: No acute rib fractures noted.   Original Report Authenticated By: Alcide Clever, M.D.   Dg Knee Complete 4 Views Right  09/26/2012   *RADIOLOGY REPORT*  Clinical Data: Recent traumatic injury with pain  RIGHT KNEE - COMPLETE 4+ VIEW  Comparison: None.  Findings: Degenerative changes with medial joint space narrowing are noted.   No acute fracture or dislocation is seen.  No soft tissue abnormality is seen.  IMPRESSION: No acute abnormality is noted.   Original Report Authenticated By: Alcide Clever, M.D.   Diagnosis: 1. Chest wall contusion 2. Knee contusion  MDM  Patient presents to ER with complaints of pain on her right side chest wall as well as her right knee after a fall. She was being transferred at the time of the fall. There was no head injury. No concern for neck or back pain, these were clinically cleared. X-ray of the chest and ribs did not show any acute pathology. No pneumothorax, hemothorax, contusion or rib fracture seen. X-ray of knee was negative as well. Patient was counseled on  the need for pulmonary toilet to return for fever, cough or shortness of breath. Followup with primary doctor if not improving in 3- 4 days.  Gilda Crease, MD 09/26/12 1500

## 2012-09-26 NOTE — ED Notes (Signed)
Pt placed on bedpan after wet depends removed and peri care given.

## 2012-09-26 NOTE — ED Notes (Signed)
Pt fell while being transferred on Sunday, cont. To have right knee pain.  Caregiver noted a "red spot on her right side" no loc

## 2012-09-26 NOTE — ED Notes (Signed)
Pt presents after a fall while attempted to transfer from wheelchair to bed with assistance from home health care giver. Pt reports rt knee was bent underneath her during the fall and now c/o rt knee pain/discomfort. Minimal swelling noted in said knee. Pt also has small quarter size red mark on rt lateral rib area. Denies SOB, any difficulty breathing and  LOC. Pt has history of stroke in 2010 that has left her with the inability to ambulate, therefore pt is wheelchair bound at home with simple transfers from bedside commode and bed. Skin is intact, pink warm and dry with no noted breakdowns noted at this point. NAD noted at this time.

## 2012-09-26 NOTE — ED Notes (Signed)
Used female urinal

## 2012-09-29 ENCOUNTER — Ambulatory Visit: Payer: Self-pay | Admitting: Family Medicine

## 2012-10-02 ENCOUNTER — Encounter: Payer: Self-pay | Admitting: Family Medicine

## 2012-10-02 ENCOUNTER — Ambulatory Visit (INDEPENDENT_AMBULATORY_CARE_PROVIDER_SITE_OTHER): Payer: PRIVATE HEALTH INSURANCE | Admitting: Family Medicine

## 2012-10-02 VITALS — BP 155/80 | HR 65 | Temp 97.3°F

## 2012-10-02 DIAGNOSIS — E039 Hypothyroidism, unspecified: Secondary | ICD-10-CM

## 2012-10-02 DIAGNOSIS — M797 Fibromyalgia: Secondary | ICD-10-CM

## 2012-10-02 DIAGNOSIS — N393 Stress incontinence (female) (male): Secondary | ICD-10-CM

## 2012-10-02 DIAGNOSIS — F418 Other specified anxiety disorders: Secondary | ICD-10-CM

## 2012-10-02 DIAGNOSIS — I639 Cerebral infarction, unspecified: Secondary | ICD-10-CM

## 2012-10-02 DIAGNOSIS — I10 Essential (primary) hypertension: Secondary | ICD-10-CM

## 2012-10-02 DIAGNOSIS — F03918 Unspecified dementia, unspecified severity, with other behavioral disturbance: Secondary | ICD-10-CM

## 2012-10-02 DIAGNOSIS — F0391 Unspecified dementia with behavioral disturbance: Secondary | ICD-10-CM

## 2012-10-02 DIAGNOSIS — E785 Hyperlipidemia, unspecified: Secondary | ICD-10-CM

## 2012-10-02 DIAGNOSIS — E119 Type 2 diabetes mellitus without complications: Secondary | ICD-10-CM

## 2012-10-02 DIAGNOSIS — K5909 Other constipation: Secondary | ICD-10-CM

## 2012-10-02 LAB — POCT GLYCOSYLATED HEMOGLOBIN (HGB A1C): Hemoglobin A1C: 6.1

## 2012-10-02 NOTE — Progress Notes (Signed)
Patient ID: Sarah Skinner, female   DOB: 12/10/1940, 72 y.o.   MRN: [id number] SUBJECTIVE: CC: Chief Complaint  Patient presents with  . Follow-up    3 month follow up chronic probems     HPI: Was being transferred and the helper couldn't hold her up and she buckled down and hit the right knee. Was evaluated in the ED and not found to have a fracture. Right knee sore. They used to have  aHAuer lift but it was too cumbersome. The husband decline a Rx for the lift. Came to get checked and routine follow up.  Also, needed clarification of what to do about the sore knee.  Past Medical History  Diagnosis Date  . Hypertension   . Diabetes mellitus   . Hypothyroidism   . Blood transfusion     hx of with childbirth   . Stress incontinence     stress incontinence   . GERD (gastroesophageal reflux disease)   . Headache(784.0)     hx of   . Fibromyalgia   . Anxiety   . Arthritis     hands, feet , knees   . Stroke     left sided weakness   . Anginal pain     not currently seeing cardiologist   Past Surgical History  Procedure Laterality Date  . Abdominal hysterectomy    . Cholecystectomy    . Rectocele repair    . Cervical fusion    . Tubal ligation    . Wisdom tooth extraction    . Cystoscopy with injection  05/18/2011    Procedure: CYSTOSCOPY WITH INJECTION;  Surgeon: Anner Crete, MD;  Location: [facility];  Service: Urology;  Laterality: N/A;  Injection of Macroplastique  . Flexible sigmoidoscopy  08/18/2011    Procedure: FLEXIBLE SIGMOIDOSCOPY;  Surgeon: Charna Elizabeth, MD;  Location: [facility];  Service: Endoscopy;  Laterality: N/A;   History   Social History  . Marital Status: Married    Spouse Name: Shon Hale    Number of Children: 6  . Years of Education: 12   Occupational History  . Retired IT sales professional    Social History Main Topics  . Smoking status: Former Smoker -- 2.00 packs/day for 20 years    Types: Cigarettes    Quit date: 02/23/1991  . Smokeless  tobacco: Never Used     Comment: Quite 20 Yrs Ago  . Alcohol Use: No     Comment: hx of no longer uses   . Drug Use: No  . Sexually Active: No   Other Topics Concern  . Not on file   Social History Narrative   Married.  Lives in [place] with her husband.  Wheelchair bound.  Transfers with assistance.  Non-ambulatory.   Family History  Problem Relation Age of Onset  . Heart failure Mother   . COPD Father   . Diabetes Sister   . Heart failure Sister    Current Outpatient Prescriptions on File Prior to Visit  Medication Sig Dispense Refill  . ALPRAZolam (XANAX) 0.5 MG tablet TAKE ONE TABLET BY MOUTH THREE TIMES DAILY AS NEEDED  100 tablet  2  . amLODipine (NORVASC) 5 MG tablet Take 5 mg by mouth every morning.      Marland Kitchen buPROPion (WELLBUTRIN SR) 150 MG 12 hr tablet TAKE ONE TABLET BY MOUTH ONCE DAILY  30 tablet  0  . cholecalciferol (VITAMIN D) 1000 UNITS tablet Take 1,000 Units by mouth every morning.       Marland Kitchen  diclofenac (VOLTAREN) 50 MG EC tablet Take 1 tablet (50 mg total) by mouth 2 (two) times daily.  60 tablet  5  . docusate sodium (COLACE) 100 MG capsule Take 200 mg by mouth 2 (two) times daily.      Marland Kitchen escitalopram (LEXAPRO) 20 MG tablet Take 1 tablet (20 mg total) by mouth every morning.  30 tablet  5  . FREESTYLE LITE test strip USE TO CHECK GLUCOSE 4 TIMES DAILY  100 each  2  . HYDROcodone-acetaminophen (NORCO/VICODIN) 5-325 MG per tablet Take 1 tablet by mouth every 4 (four) hours as needed for pain.  20 tablet  0  . Insulin Syringe-Needle U-100 31G X 5/16" 0.3 ML MISC       . KLOR-CON M20 20 MEQ tablet TAKE ONE TABLET BY MOUTH EVERY DAY  30 tablet  2  . Lactobacillus Rhamnosus, GG, (CULTURELLE PO) Take 1 tablet by mouth daily.      . Lancets (FREESTYLE) lancets       . LANTUS 100 UNIT/ML injection INJECT 31 UNITS AT BEDTIME  10 mL  1  . levothyroxine (SYNTHROID, LEVOTHROID) 50 MCG tablet Take 50 mcg by mouth AC breakfast.       . loratadine (CLARITIN) 10 MG tablet Take 10 mg  by mouth daily.       . metoprolol (LOPRESSOR) 50 MG tablet Take 1 tablet (50 mg total) by mouth 2 (two) times daily.  60 tablet  5  . mirabegron ER (MYRBETRIQ) 25 MG TB24 Take 25 mg by mouth daily.      . Multiple Vitamins-Minerals (MULTIVITAMIN & MINERAL PO) Take 1 tablet by mouth daily with lunch. Multivitamin with calcium 1000mg , magnesium 400mg  and zinc 15 mg per daughter.      Marland Kitchen NOVOLOG 100 UNIT/ML injection INJECT 3 UNITS BEFORE EACH MEAL  10 mL  1  . Omega-3 Fatty Acids (FISH OIL) 1000 MG CAPS Take 1 capsule by mouth 4 (four) times daily.       . pantoprazole (PROTONIX) 40 MG tablet TAKE ONE TABLET BY MOUTH EVERY DAY  30 tablet  5  . polyethylene glycol (MIRALAX / GLYCOLAX) packet Take 17 g by mouth daily.      . rosuvastatin (CRESTOR) 10 MG tablet Take 10 mg by mouth every other day.      . zolpidem (AMBIEN) 5 MG tablet TAKE ONE TABLET BY MOUTH AT BEDTIME  30 tablet  2  . [DISCONTINUED] fexofenadine (ALLEGRA) 180 MG tablet Take 180 mg by mouth every morning.        No current facility-administered medications on file prior to visit.   Allergies  Allergen Reactions  . Aspirin     Red splotches all over body   . Penicillins Hives    Red splotches all over body  . Sulfa Antibiotics Hives    Red splotches all over body  . Latex Rash   There is no immunization history for the selected administration types on file for this patient. Prior to Admission medications   Medication Sig Start Date End Date Taking? Authorizing Provider  ALPRAZolam Prudy Feeler) 0.5 MG tablet TAKE ONE TABLET BY MOUTH THREE TIMES DAILY AS NEEDED 08/17/12  Yes Mary-Margaret Daphine Deutscher, FNP  amLODipine (NORVASC) 5 MG tablet Take 5 mg by mouth every morning.   Yes Historical Provider, MD  buPROPion [facility] SR) 150 MG 12 hr tablet TAKE ONE TABLET BY MOUTH ONCE DAILY 09/12/12  Yes Inis Sizer, PA-C  cholecalciferol (VITAMIN D) 1000 UNITS tablet Take  1,000 Units by mouth every morning.    Yes Historical Provider, MD   diclofenac (VOLTAREN) 50 MG EC tablet Take 1 tablet (50 mg total) by mouth 2 (two) times daily. 05/23/12  Yes Ileana Ladd, MD  docusate sodium (COLACE) 100 MG capsule Take 200 mg by mouth 2 (two) times daily. 08/20/11  Yes Alison Murray, MD  escitalopram (LEXAPRO) 20 MG tablet Take 1 tablet (20 mg total) by mouth every morning. 06/28/12  Yes Ileana Ladd, MD  FREESTYLE LITE test strip USE TO CHECK GLUCOSE 4 TIMES DAILY 07/14/12  Yes Ileana Ladd, MD  Glucose Blood (FREESTYLE LITE TEST VI)  08/27/12  Yes Historical Provider, MD  HYDROcodone-acetaminophen (NORCO/VICODIN) 5-325 MG per tablet Take 1 tablet by mouth every 4 (four) hours as needed for pain. 09/26/12  Yes Gilda Crease, MD  Insulin Syringe-Needle U-100 31G X 5/16" 0.3 ML MISC  05/23/12  Yes Historical Provider, MD  KLOR-CON M20 20 MEQ tablet TAKE ONE TABLET BY MOUTH EVERY DAY 09/16/12  Yes Ileana Ladd, MD  Lactobacillus Rhamnosus, GG, (CULTURELLE PO) Take 1 tablet by mouth daily.   Yes Historical Provider, MD  Lancets (FREESTYLE) lancets  05/23/12  Yes Historical Provider, MD  LANTUS 100 UNIT/ML injection INJECT 31 UNITS AT BEDTIME 09/25/12  Yes Ernestina Penna, MD  levothyroxine (SYNTHROID, LEVOTHROID) 50 MCG tablet Take 50 mcg by mouth AC breakfast.    Yes Historical Provider, MD  loratadine (CLARITIN) 10 MG tablet Take 10 mg by mouth daily.    Yes Historical Provider, MD  metoprolol (LOPRESSOR) 50 MG tablet Take 1 tablet (50 mg total) by mouth 2 (two) times daily. 05/23/12  Yes Ileana Ladd, MD  mirabegron ER (MYRBETRIQ) 25 MG TB24 Take 25 mg by mouth daily.   Yes Historical Provider, MD  Multiple Vitamins-Minerals (MULTIVITAMIN & MINERAL PO) Take 1 tablet by mouth daily with lunch. Multivitamin with calcium 1000mg , magnesium 400mg  and zinc 15 mg per daughter.   Yes Historical Provider, MD  NOVOLOG 100 UNIT/ML injection INJECT 3 UNITS BEFORE [facility] MEAL 09/25/12  Yes Ernestina Penna, MD  Omega-3 Fatty Acids (FISH OIL) 1000 MG CAPS Take 1  capsule by mouth 4 (four) times daily.    Yes Historical Provider, MD  pantoprazole (PROTONIX) 40 MG tablet TAKE ONE TABLET BY MOUTH EVERY DAY 08/01/12  Yes Ileana Ladd, MD  polyethylene glycol (MIRALAX / GLYCOLAX) packet Take 17 g by mouth daily.   Yes Historical Provider, MD  rosuvastatin (CRESTOR) 10 MG tablet Take 10 mg by mouth every other day.   Yes Historical Provider, MD  valACYclovir (VALTREX) 1000 MG tablet  07/03/12  Yes Historical Provider, MD  zolpidem (AMBIEN) 5 MG tablet TAKE ONE TABLET BY MOUTH AT BEDTIME 09/18/12  Yes Ileana Ladd, MD     ROS: As above in the HPI. All other systems are stable or negative.  OBJECTIVE: APPEARANCE:  Patient in no acute distress.The patient appeared well nourished and normally developed. Acyanotic. Waist: VITAL SIGNS:BP 155/80  Pulse 65  Temp(Src) 97.3 F (36.3 C) (Oral) Obese WF in  A wheelchair  SKIN: warm and  Dry without overt rashes, tattoos and scars  HEAD and Neck: without JVD, Head and scalp: normal Eyes:No scleral icterus. Fundi normal, eye movements normal. Ears: Auricle normal, canal normal, Tympanic membranes normal, insufflation normal. Nose: normal Throat: normal Neck & thyroid: normal  CHEST & LUNGS: Chest wall: normal Lungs: Clear  CVS: Reveals the PMI to be  normally located. Regular rhythm, First and Second Heart sounds are normal,  absence of murmurs, rubs or gallops. Peripheral vasculature: Radial pulses: normal Dorsal pedis pulses: normal Posterior pulses: normal  ABDOMEN:  Appearance: Obese Benign, no organomegaly, no masses, no Abdominal Aortic enlargement. No Guarding , no rebound. No Bruits. Bowel sounds: normal  RECTAL: N/A GU: N/A  EXTREMETIES: trace edematous.  MUSCULOSKELETAL:  Spine: normal Joints: Right knee FROm but with minimal discomfort.  NEUROLOGIC: oriented to person No change in the left hemiplegia.  Results for orders placed in visit on 10/02/12  POCT GLYCOSYLATED  HEMOGLOBIN (HGB A1C)      Result Value Range   Hemoglobin A1C 6.1       ASSESSMENT: DM (diabetes mellitus) - Plan: POCT glycosylated hemoglobin (Hb A1C)  HLD (hyperlipidemia) - Plan: CMP14+EGFR, NMR, lipoprofile  Chronic constipation  CVA (cerebral infarction)  Dementia with behavioral disturbance  Depression with anxiety  Fibromyalgia  Hypertension  Stress incontinence  Hypothyroidism  PLAN: Consider Hauer Lift: family will let me know. Bring forms for shoes. Ice  Every 2 hours to the right knee.  Orders Placed This Encounter  Procedures  . CMP14+EGFR  . NMR, lipoprofile  . POCT glycosylated hemoglobin (Hb A1C)    Meds ordered this encounter  Medications  . valACYclovir (VALTREX) 1000 MG tablet    Sig:   . DISCONTD: amLODipine (NORVASC) 5 MG tablet    Sig:   . DISCONTD: Lancets (FREESTYLE) lancets    Sig:   . Glucose Blood (FREESTYLE LITE TEST VI)    Sig:   . DISCONTD: Lancets (FREESTYLE) lancets    Sig:     Continue present level of care. Caution with transfers. Home PT by family. Daily exercise 3 x a day by patient.  Return in about 3 months (around 01/02/2013) for Recheck medical problems.  Alvena Kiernan P. [place] Charon, M.D.

## 2012-10-04 LAB — NMR, LIPOPROFILE
Cholesterol: 143 mg/dL (ref <200)
HDL Cholesterol by NMR: 42 mg/dL (ref >=40)
HDL Particle Number: 29.3 umol/L — ABNORMAL LOW (ref >=30.5)
LDL Particle Number: 999 nmol/L (ref <1000)
LDL Size: 21.4 nm (ref >20.5)
LDLC SERPL CALC-MCNC: 58 mg/dL (ref <100)
LP-IR Score: 45 (ref <=45)
Small LDL Particle Number: 553 nmol/L — ABNORMAL HIGH (ref <=527)
Triglycerides by NMR: 214 mg/dL — ABNORMAL HIGH (ref <150)

## 2012-10-04 LAB — CMP14+EGFR
ALT: 41 IU/L — ABNORMAL HIGH (ref 0–32)
AST: 30 IU/L (ref 0–40)
Albumin/Globulin Ratio: 2.2 (ref 1.1–2.5)
Albumin: 4.3 g/dL (ref 3.5–4.8)
Alkaline Phosphatase: 88 IU/L (ref 39–117)
BUN/Creatinine Ratio: 11 (ref 11–26)
BUN: 10 mg/dL (ref 8–27)
CO2: 30 mmol/L — ABNORMAL HIGH (ref 18–29)
Calcium: 9.5 mg/dL (ref 8.6–10.2)
Chloride: 97 mmol/L (ref 97–108)
Creatinine, Ser: 0.89 mg/dL (ref 0.57–1.00)
GFR calc Af Amer: 75 mL/min/{1.73_m2} (ref >59)
GFR calc non Af Amer: 65 mL/min/{1.73_m2} (ref >59)
Globulin, Total: 2 g/dL (ref 1.5–4.5)
Glucose: 131 mg/dL — ABNORMAL HIGH (ref 65–99)
Potassium: 3.9 mmol/L (ref 3.5–5.2)
Sodium: 139 mmol/L (ref 134–144)
Total Bilirubin: 0.5 mg/dL (ref 0.0–1.2)
Total Protein: 6.3 g/dL (ref 6.0–8.5)

## 2012-10-04 NOTE — Progress Notes (Signed)
Quick Note:  Lab result at goal. Triglycerides a little high. Need to control carbs and Sweets. No change in Medications for now. No Change in plans and follow up. ______

## 2012-10-10 ENCOUNTER — Other Ambulatory Visit: Payer: Self-pay | Admitting: Physician Assistant

## 2012-10-22 DIAGNOSIS — I69939 Monoplegia of upper limb following unspecified cerebrovascular disease affecting unspecified side: Secondary | ICD-10-CM

## 2012-10-22 DIAGNOSIS — M069 Rheumatoid arthritis, unspecified: Secondary | ICD-10-CM

## 2012-10-22 DIAGNOSIS — E119 Type 2 diabetes mellitus without complications: Secondary | ICD-10-CM

## 2012-11-07 ENCOUNTER — Encounter: Payer: Self-pay | Admitting: *Deleted

## 2012-11-08 ENCOUNTER — Ambulatory Visit (INDEPENDENT_AMBULATORY_CARE_PROVIDER_SITE_OTHER): Payer: 59 | Admitting: Psychiatry

## 2012-11-08 DIAGNOSIS — F0630 Mood disorder due to known physiological condition, unspecified: Secondary | ICD-10-CM

## 2012-11-08 DIAGNOSIS — F329 Major depressive disorder, single episode, unspecified: Secondary | ICD-10-CM

## 2012-11-08 MED ORDER — BUPROPION HCL ER (XL) 300 MG PO TB24: 300.0000 mg | ORAL_TABLET | Freq: Every day | ORAL | Status: DC

## 2012-11-08 NOTE — Progress Notes (Signed)
Psychiatric Assessment Adult  Patient Identification:  Sarah Skinner Date of Evaluation:  11/08/2012 Chief Complaint: smiling yet feels depressed History of Chief Complaint:  No chief complaint on file. this patient is a 72 year old white married mother who is brought by her husband and her daughter who were there in the evaluation. She was referred by her primary care doctor, Dr. Modesto Charon the request was to do an evaluation of her psychiatric medication and to determine if there was some degree of dementia. This patient is a victim of a right CVA. 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 Initially the patient describes persistent daily depression for years. In time however he admitted that he really was mild depression. Presently he claims she's only depressed about her medical conditions. The patient was unable describe any other significant psychosocial stressor. The patient is sleeping and eating well. She does have her a significant reduction in her energy level and according her husband is having problems concentrating. Her psychomotor functioning is normal. She's not agitated nor she anxious. She denies worthlessness. She denies significant suicidal ideation and has never made a suicide attempt. The patient is still able to enjoy music and possible music specifically. She does watch TV.at this time the patient is not worry about finances and believes her kids are for the most part doing well. It is noted that she had a uncle died in the last year but she was not close to him. The patient denies any alcohol use. The patient denies any symptoms of psychosis. She denies a clear episode of major depression nor of mania. She denies any specific symptoms of generalized anxiety disorder, panic disorder or obsessive-compulsive disorder. The patient does describe significant right knee pain which is presently being  treated. She generally has a number of chronic medical conditions many than pain producing including fibromyalgia. Patient has no past psychiatric history. She's never seen a psychiatrist or she ever been hospitalized. It is noted that she's on multiple psychotropic drugs. This includes Lexapro 20 mg, Wellbutrin 150 mg slow release and Xanax 0.5 mg 3 times a day. His been on Xanax for almost a decade Lexapro for about 3 years and Wellbutrin only for the last few months. The family reports her thyroid studies come back normal. This patient is expected Mini-Mental Status exam should be that of 28 or greater. In her Mini-Mental Status today the patient scored a 25. But it should be noted that she could remember 3 words after 10 minutes which is inconsistent with a true chronic dementia.  HPI Review of Systems Physical Exam  Depressive Symptoms: depressed mood,  (Hypo) Manic Symptoms:   Elevated Mood:  No Irritable Mood:  No Grandiosity:  No Distractibility:  No Labiality of Mood:  No Delusions:  No Hallucinations:  No Impulsivity:  No Sexually Inappropriate Behavior:  No Financial Extravagance:  No Flight of Ideas:  No  Anxiety Symptoms: Excessive Worry:  No Panic Symptoms:  No Agoraphobia:  No Obsessive Compulsive: No  Symptoms: None, Specific Phobias:  No Social Anxiety:  No  Psychotic Symptoms:  Hallucinations: No None Delusions:  No Paranoia:  No   Ideas of Reference:  No  PTSD Symptoms: Ever had a traumatic exposure:  No Had a traumatic exposure in the last month:  No Re-experiencing: No Hypervigilance:  No Hyperarousal: No None Avoidance: No None  Traumatic Brain Injury: No   Past Psychiatric  History: Diagnosis:   Hospitalizations:   Outpatient Care:   Substance Abuse Care:   Self-Mutilation:   Suicidal Attempts:   Violent Behaviors:    Past Medical History:   Past Medical History  Diagnosis Date  . Hypertension   . Diabetes mellitus   . Hypothyroidism    . Blood transfusion     hx of with childbirth   . Stress incontinence     stress incontinence   . GERD (gastroesophageal reflux disease)   . Headache(784.0)     hx of   . Fibromyalgia   . Anxiety   . Arthritis     hands, feet , knees   . Stroke     left sided weakness   . Anginal pain     not currently seeing cardiologist   History of Loss of Consciousness:   Seizure History:   Cardiac History:   Allergies:   Allergies  Allergen Reactions  . Aspirin     Red splotches all over body   . Penicillins Hives    Red splotches all over body  . Sulfa Antibiotics Hives    Red splotches all over body  . Latex Rash   Current Medications:  Current Outpatient Prescriptions  Medication Sig Dispense Refill  . ALPRAZolam (XANAX) 0.5 MG tablet TAKE ONE TABLET BY MOUTH THREE TIMES DAILY AS NEEDED  100 tablet  2  . amLODipine (NORVASC) 5 MG tablet Take 5 mg by mouth every morning.      Marland Kitchen buPROPion (WELLBUTRIN SR) 150 MG 12 hr tablet TAKE ONE TABLET BY MOUTH ONCE DAILY  30 tablet  2  . cholecalciferol (VITAMIN D) 1000 UNITS tablet Take 1,000 Units by mouth every morning.       . diclofenac (VOLTAREN) 50 MG EC tablet Take 1 tablet (50 mg total) by mouth 2 (two) times daily.  60 tablet  5  . docusate sodium (COLACE) 100 MG capsule Take 200 mg by mouth 2 (two) times daily.      Marland Kitchen escitalopram (LEXAPRO) 20 MG tablet Take 1 tablet (20 mg total) by mouth every morning.  30 tablet  5  . FREESTYLE LITE test strip USE TO CHECK GLUCOSE 4 TIMES DAILY  100 each  2  . Glucose Blood (FREESTYLE LITE TEST VI)       . HYDROcodone-acetaminophen (NORCO/VICODIN) 5-325 MG per tablet Take 1 tablet by mouth every 4 (four) hours as needed for pain.  20 tablet  0  . Insulin Syringe-Needle U-100 31G X 5/16" 0.3 ML MISC       . KLOR-CON M20 20 MEQ tablet TAKE ONE TABLET BY MOUTH EVERY DAY  30 tablet  2  . Lactobacillus Rhamnosus, GG, (CULTURELLE PO) Take 1 tablet by mouth daily.      . Lancets (FREESTYLE) lancets        . LANTUS 100 UNIT/ML injection INJECT 31 UNITS AT BEDTIME  10 mL  1  . levothyroxine (SYNTHROID, LEVOTHROID) 50 MCG tablet Take 50 mcg by mouth AC breakfast.       . loratadine (CLARITIN) 10 MG tablet Take 10 mg by mouth daily.       . metoprolol (LOPRESSOR) 50 MG tablet Take 1 tablet (50 mg total) by mouth 2 (two) times daily.  60 tablet  5  . mirabegron ER (MYRBETRIQ) 25 MG TB24 Take 25 mg by mouth daily.      . Multiple Vitamins-Minerals (MULTIVITAMIN & MINERAL PO) Take 1 tablet by mouth daily with lunch. Multivitamin with  calcium 1000mg , magnesium 400mg  and zinc 15 mg per daughter.      Marland Kitchen NOVOLOG 100 UNIT/ML injection INJECT 3 UNITS BEFORE EACH MEAL  10 mL  1  . Omega-3 Fatty Acids (FISH OIL) 1000 MG CAPS Take 1 capsule by mouth 4 (four) times daily.       . pantoprazole (PROTONIX) 40 MG tablet TAKE ONE TABLET BY MOUTH EVERY DAY  30 tablet  5  . polyethylene glycol (MIRALAX / GLYCOLAX) packet Take 17 g by mouth daily.      . rosuvastatin (CRESTOR) 10 MG tablet Take 10 mg by mouth every other day.      . valACYclovir (VALTREX) 1000 MG tablet       . zolpidem (AMBIEN) 5 MG tablet TAKE ONE TABLET BY MOUTH AT BEDTIME  30 tablet  2  . [DISCONTINUED] fexofenadine (ALLEGRA) 180 MG tablet Take 180 mg by mouth every morning.        No current facility-administered medications for this visit.    Previous Psychotropic Medications:  Medication Dose   Lexapro 20 mg in the morning    Wellbutrin 150 mg slow release one in the morning   Xanax 0.5 mg 3 times a day                Substance Abuse History in the last 12 months: Substance Age of 1st Use Last Use Amount Specific Type    Medical Consequences of Substance Abuse:   Legal Consequences of Substance Abuse:   Family Consequences of Substance Abuse:   Blackouts:   DT's:   Withdrawal Symptoms:     Social History: Current Place of Residence: [place] of Birth:  Family Members:  Marital Status:  Married Children:  1  Sons:   Daughters:  Relationships:  Education:   Educational Problems/Performance:  Religious Beliefs/Practices:  History of Abuse:  Teacher, music History:   Legal History:  Hobbies/Interests:   Family History:   Family History  Problem Relation Age of Onset  . Heart failure Mother   . COPD Father   . Diabetes Sister   . Heart failure Sister     Mental Status Examination/Evaluation: Objective:  Appearance: Casual  Eye Contact::  Good  Speech:  Clear and Coherent  Volume:  Normal  Mood: happy  Affect:  Congruent  Thought Process:  Coherent  Orientation:  Full (Time, Place, and Person)  Thought Content:  WDL  Suicidal Thoughts:  No  Homicidal Thoughts:  No  Judgement:  Good  Insight:  Fair  Psychomotor Activity:  Normal  Akathisia:  No  Handed:  Right  AIMS (if indicated):    Assets:  Desire for Improvement    Laboratory/X-Ray Psychological Evaluation(s)        Assessment:  Axis I: Depressive Disorder secondary to general medical condition  AXIS I Depressive Disorder secondary to general medical condition  AXIS II Deferred  AXIS III Past Medical History  Diagnosis Date  . Hypertension   . Diabetes mellitus   . Hypothyroidism   . Blood transfusion     hx of with childbirth   . Stress incontinence     stress incontinence   . GERD (gastroesophageal reflux disease)   . Headache(784.0)     hx of   . Fibromyalgia   . Anxiety   . Arthritis     hands, feet , knees   . Stroke     left sided weakness   . Anginal pain     not  currently seeing cardiologist     AXIS IV other psychosocial or environmental problems  AXIS V 51-60 moderate symptoms   Treatment Plan/Recommendations:  Plan of Care: at this time the patient she'll taper off her Lexapro by taking half of a 20 for 6 days and discontinue it. At this time the patient will increase her Wellbutrin to taking 150 mg slow-release twice a day and I will call in a prescription for  Wellbutrin 300 mg XL one in the morning. At this time she was she'll continue Xanax 0.5 mg 3 times a day. This patient to return to see me in 2 months  Laboratory:    Psychotherapy:   Medications: Lexapro 20 mg, Wellbutrin 150 mg 1 slow-release pill in the morning Xanax 0.5 mg 3 times a day  Routine PRN Medications:  No  Consultations:   Safety Concerns:    Other:      Lucas Mallow, MD 9/17/20142:11 PM

## 2012-11-13 ENCOUNTER — Ambulatory Visit: Payer: PRIVATE HEALTH INSURANCE | Admitting: Family Medicine

## 2012-11-14 ENCOUNTER — Telehealth (HOSPITAL_COMMUNITY): Payer: Self-pay | Admitting: *Deleted

## 2012-11-14 NOTE — Telephone Encounter (Signed)
Daughter left MW:UXLKG medication changes made last week-pt has shaking, trembling and slurred speech. Requests call from provider

## 2012-11-15 NOTE — Telephone Encounter (Signed)
Today on September 24 I spoke to Alba Cory the daughter this patient. The reason for her initial call to me was because the patient had slurred speech. They made a decision to potentially take her to the hospital but the patient refused. The patient's daughter then contacted the pharmacy and they shared with her what I did share with her that Lexapro does not cause this problem. The daughter was thinking that withdrawal from Lexapro recall slurred speech and I shared that is not the case. The daughter what one does say that after her mother rest of her spur speech one way. At this time the patient is not having any neurological symptoms at all. It should also be noted that the Lexapro now is gone and that she is now starting the higher dose of Wellbutrin. I shared with the patient again but higher doses Wellbutrin does not make slurred speech. The patient has an appointment tomorrow with her primary care doctor with your review the potential etiologies of slurred speech. Again shared with the patient's daughter that Lexapro nor Wellbutrin I believe are involved in her symptoms. The patient will continue taking the medicines that I prescribed and see me in her next appointment. The daughter seem to understand all my recommendations and seemed pleased that I called her.

## 2012-11-16 ENCOUNTER — Ambulatory Visit (INDEPENDENT_AMBULATORY_CARE_PROVIDER_SITE_OTHER): Payer: PRIVATE HEALTH INSURANCE | Admitting: Family Medicine

## 2012-11-16 ENCOUNTER — Encounter: Payer: Self-pay | Admitting: Family Medicine

## 2012-11-16 VITALS — BP 149/71 | HR 69 | Temp 97.3°F

## 2012-11-16 DIAGNOSIS — F03918 Unspecified dementia, unspecified severity, with other behavioral disturbance: Secondary | ICD-10-CM

## 2012-11-16 DIAGNOSIS — E1159 Type 2 diabetes mellitus with other circulatory complications: Secondary | ICD-10-CM | POA: Insufficient documentation

## 2012-11-16 DIAGNOSIS — F0391 Unspecified dementia with behavioral disturbance: Secondary | ICD-10-CM

## 2012-11-16 DIAGNOSIS — I6350 Cerebral infarction due to unspecified occlusion or stenosis of unspecified cerebral artery: Secondary | ICD-10-CM

## 2012-11-16 DIAGNOSIS — I639 Cerebral infarction, unspecified: Secondary | ICD-10-CM

## 2012-11-16 NOTE — Progress Notes (Signed)
Patient ID: Sarah Skinner, female   DOB: 12/12/1940, 72 y.o.   MRN: [id number] SUBJECTIVE: CC: Chief Complaint  Patient presents with  . Follow-up    pt wants diabetic shoes. husband states spell last week. shoes too tight     HPI:  Gets recurrent tremors. Evaluated by neurology and the ER and no cause found. Daughter suspects that is a medication effect, side effect or withdrawal. She noticed it more when she weaned off the ssri. Sometimes occurs before the next dose of alprazolam Patient is here for follow up of Diabetes Mellitus: Symptoms evaluated: Denies Nocturia ,Denies Urinary Frequency , denies Blurred vision ,deniesDizziness,denies.Dysuria,denies paresthesias, denies extremity pain or ulcers.Marland Kitchendenies chest pain. has had an annual eye exam. do check the feet. Does check CBGs. Average CBG:90s in am Denies episodes of hypoglycemia. Does have an emergency hypoglycemic plan. admits toCompliance with medications. Denies Problems with medications.  Past Medical History  Diagnosis Date  . Hypertension   . Diabetes mellitus   . Hypothyroidism   . Blood transfusion     hx of with childbirth   . Stress incontinence     stress incontinence   . GERD (gastroesophageal reflux disease)   . Headache(784.0)     hx of   . Fibromyalgia   . Anxiety   . Arthritis     hands, feet , knees   . Stroke     left sided weakness   . Anginal pain     not currently seeing cardiologist   Past Surgical History  Procedure Laterality Date  . Abdominal hysterectomy    . Cholecystectomy    . Rectocele repair    . Cervical fusion    . Tubal ligation    . Wisdom tooth extraction    . Cystoscopy with injection  05/18/2011    Procedure: CYSTOSCOPY WITH INJECTION;  Surgeon: Anner Crete, MD;  Location: [facility];  Service: Urology;  Laterality: N/A;  Injection of Macroplastique  . Flexible sigmoidoscopy  08/18/2011    Procedure: FLEXIBLE SIGMOIDOSCOPY;  Surgeon: Charna Elizabeth, MD;  Location: [facility]  ENDOSCOPY;  Service: Endoscopy;  Laterality: N/A;   History   Social History  . Marital Status: Married    Spouse Name: Shon Hale    Number of Children: 6  . Years of Education: 12   Occupational History  . Retired IT sales professional    Social History Main Topics  . Smoking status: Former Smoker -- 2.00 packs/day for 20 years    Types: Cigarettes    Quit date: 02/23/1991  . Smokeless tobacco: Never Used     Comment: Quite 20 Yrs Ago  . Alcohol Use: No     Comment: hx of no longer uses   . Drug Use: No  . Sexual Activity: No   Other Topics Concern  . Not on file   Social History Narrative   Married.  Lives in [place] with her husband.  Wheelchair bound.  Transfers with assistance.  Non-ambulatory.   Family History  Problem Relation Age of Onset  . Heart failure Mother   . COPD Father   . Diabetes Sister   . Heart failure Sister    Current Outpatient Prescriptions on File Prior to Visit  Medication Sig Dispense Refill  . ALPRAZolam (XANAX) 0.5 MG tablet TAKE ONE TABLET BY MOUTH THREE TIMES DAILY AS NEEDED  100 tablet  2  . amLODipine (NORVASC) 5 MG tablet Take 5 mg by mouth every morning.      Marland Kitchen buPROPion (  WELLBUTRIN XL) 300 MG 24 hr tablet Take 1 tablet (300 mg total) by mouth daily.  30 tablet  5  . diclofenac (VOLTAREN) 50 MG EC tablet Take 1 tablet (50 mg total) by mouth 2 (two) times daily.  60 tablet  5  . docusate sodium (COLACE) 100 MG capsule Take 200 mg by mouth 2 (two) times daily.      . cholecalciferol (VITAMIN D) 1000 UNITS tablet Take 1,000 Units by mouth every morning.       Marland Kitchen FREESTYLE LITE test strip USE TO CHECK GLUCOSE 4 TIMES DAILY  100 each  2  . HYDROcodone-acetaminophen (NORCO/VICODIN) 5-325 MG per tablet Take 1 tablet by mouth every 4 (four) hours as needed for pain.  20 tablet  0  . Insulin Syringe-Needle U-100 31G X 5/16" 0.3 ML MISC       . KLOR-CON M20 20 MEQ tablet TAKE ONE TABLET BY MOUTH EVERY DAY  30 tablet  2  . Lactobacillus Rhamnosus, GG,  (CULTURELLE PO) Take 1 tablet by mouth daily.      . Lancets (FREESTYLE) lancets       . LANTUS 100 UNIT/ML injection INJECT 31 UNITS AT BEDTIME  10 mL  1  . levothyroxine (SYNTHROID, LEVOTHROID) 50 MCG tablet Take 50 mcg by mouth AC breakfast.       . loratadine (CLARITIN) 10 MG tablet Take 10 mg by mouth daily.       . metoprolol (LOPRESSOR) 50 MG tablet Take 1 tablet (50 mg total) by mouth 2 (two) times daily.  60 tablet  5  . mirabegron ER (MYRBETRIQ) 25 MG TB24 Take 25 mg by mouth daily.      . Multiple Vitamins-Minerals (MULTIVITAMIN & MINERAL PO) Take 1 tablet by mouth daily with lunch. Multivitamin with calcium 1000mg , magnesium 400mg  and zinc 15 mg per daughter.      Marland Kitchen NOVOLOG 100 UNIT/ML injection INJECT 3 UNITS BEFORE EACH MEAL  10 mL  1  . Omega-3 Fatty Acids (FISH OIL) 1000 MG CAPS Take 1 capsule by mouth 4 (four) times daily.       . pantoprazole (PROTONIX) 40 MG tablet TAKE ONE TABLET BY MOUTH EVERY DAY  30 tablet  5  . polyethylene glycol (MIRALAX / GLYCOLAX) packet Take 17 g by mouth daily.      . rosuvastatin (CRESTOR) 10 MG tablet Take 10 mg by mouth every other day.      . valACYclovir (VALTREX) 1000 MG tablet       . zolpidem (AMBIEN) 5 MG tablet TAKE ONE TABLET BY MOUTH AT BEDTIME  30 tablet  2  . [DISCONTINUED] fexofenadine (ALLEGRA) 180 MG tablet Take 180 mg by mouth every morning.        No current facility-administered medications on file prior to visit.   Allergies  Allergen Reactions  . Aspirin     Red splotches all over body   . Penicillins Hives    Red splotches all over body  . Sulfa Antibiotics Hives    Red splotches all over body  . Latex Rash   There is no immunization history for the selected administration types on file for this patient. Prior to Admission medications   Medication Sig Start Date End Date Taking? Authorizing Provider  ALPRAZolam Prudy Feeler) 0.5 MG tablet TAKE ONE TABLET BY MOUTH THREE TIMES DAILY AS NEEDED 08/17/12  Yes Mary-Margaret  Daphine Deutscher, FNP  amLODipine (NORVASC) 5 MG tablet Take 5 mg by mouth every morning.   Yes  Historical Provider, MD  buPROPion (WELLBUTRIN XL) 300 MG 24 hr tablet Take 1 tablet (300 mg total) by mouth daily. 11/08/12  Yes Archer Asa, MD  diclofenac (VOLTAREN) 50 MG EC tablet Take 1 tablet (50 mg total) by mouth 2 (two) times daily. 05/23/12  Yes Ileana Ladd, MD  docusate sodium (COLACE) 100 MG capsule Take 200 mg by mouth 2 (two) times daily. 08/20/11  Yes Alison Murray, MD  cholecalciferol (VITAMIN D) 1000 UNITS tablet Take 1,000 Units by mouth every morning.     Historical Provider, MD  FREESTYLE LITE test strip USE TO CHECK GLUCOSE 4 TIMES DAILY 07/14/12   Ileana Ladd, MD  HYDROcodone-acetaminophen (NORCO/VICODIN) 5-325 MG per tablet Take 1 tablet by mouth every 4 (four) hours as needed for pain. 09/26/12   Gilda Crease, MD  Insulin Syringe-Needle U-100 31G X 15/64" 0.5 ML MISC  10/28/12   Historical Provider, MD  Insulin Syringe-Needle U-100 31G X 5/16" 0.3 ML MISC  05/23/12   Historical Provider, MD  KLOR-CON M20 20 MEQ tablet TAKE ONE TABLET BY MOUTH EVERY DAY 09/16/12   Ileana Ladd, MD  Lactobacillus Rhamnosus, GG, (CULTURELLE PO) Take 1 tablet by mouth daily.    Historical Provider, MD  Lancets (FREESTYLE) lancets  05/23/12   Historical Provider, MD  LANTUS 100 UNIT/ML injection INJECT 31 UNITS AT BEDTIME 09/25/12   Ernestina Penna, MD  levothyroxine (SYNTHROID, LEVOTHROID) 50 MCG tablet Take 50 mcg by mouth AC breakfast.     Historical Provider, MD  loratadine (CLARITIN) 10 MG tablet Take 10 mg by mouth daily.     Historical Provider, MD  metoprolol (LOPRESSOR) 50 MG tablet Take 1 tablet (50 mg total) by mouth 2 (two) times daily. 05/23/12   Ileana Ladd, MD  mirabegron ER (MYRBETRIQ) 25 MG TB24 Take 25 mg by mouth daily.    Historical Provider, MD  Multiple Vitamins-Minerals (MULTIVITAMIN & MINERAL PO) Take 1 tablet by mouth daily with lunch. Multivitamin with calcium 1000mg , magnesium  400mg  and zinc 15 mg per daughter.    Historical Provider, MD  NOVOLOG 100 UNIT/ML injection INJECT 3 UNITS BEFORE EACH MEAL 09/25/12   Ernestina Penna, MD  Omega-3 Fatty Acids (FISH OIL) 1000 MG CAPS Take 1 capsule by mouth 4 (four) times daily.     Historical Provider, MD  pantoprazole (PROTONIX) 40 MG tablet TAKE ONE TABLET BY MOUTH EVERY DAY 08/01/12   Ileana Ladd, MD  polyethylene glycol [facility] / GLYCOLAX) packet Take 17 g by mouth daily.    Historical Provider, MD  rosuvastatin (CRESTOR) 10 MG tablet Take 10 mg by mouth every other day.    Historical Provider, MD  valACYclovir (VALTREX) 1000 MG tablet  07/03/12   Historical Provider, MD  zolpidem (AMBIEN) 5 MG tablet TAKE ONE TABLET BY MOUTH AT BEDTIME 09/18/12   Ileana Ladd, MD     ROS: As above in the HPI. All other systems are stable or negative.  OBJECTIVE: APPEARANCE:  Patient in no acute distress.The patient appeared well nourished and normally developed. Acyanotic. Waist: VITAL SIGNS:BP 149/71  Pulse 69  Temp(Src) 97.3 F (36.3 C) (Oral) Obese WF Wheelchair bound  SKIN: warm and  Dry without overt rashes, tattoos and scars  HEAD and Neck: without JVD, Head and scalp: normal Eyes:No scleral icterus. Fundi normal, eye movements normal. Ears: Auricle normal, canal normal, Tympanic membranes normal, insufflation normal. Nose: normal Throat: normal Neck & thyroid: normal  CHEST & LUNGS:  Chest wall: normal Lungs: Clear  CVS: Reveals the PMI to be normally located. Regular rhythm, First and Second Heart sounds are normal,  absence of murmurs, rubs or gallops.  ABDOMEN:  Appearance: Obese Benign, no organomegaly, no masses, no Abdominal Aortic enlargement. No Guarding , no rebound. No Bruits. Bowel sounds: normal  RECTAL: N/A GU: N/A  EXTREMETIES: nonedematous.   NEUROLOGIC: oriented to,place and person; left hemiplegia  ASSESSMENT: Type II or unspecified type diabetes mellitus with peripheral  circulatory disorders, uncontrolled(250.72)  CVA (cerebral infarction)  Dementia with behavioral disturbance  Suspect medication effect or withdrawal.  Also may be an atypical seizure.  PLAN: offered EEG. Family and patient deferred. Will observe and if recurs over the next 4 weeks they will consider it.  Form for the DM shoes completed and faxed.  Continue present medications.  Keep the follow up appointment she has.  Fara Worthy P. Modesto Charon, M.D.

## 2012-11-21 ENCOUNTER — Other Ambulatory Visit: Payer: Self-pay | Admitting: Family Medicine

## 2012-11-25 ENCOUNTER — Other Ambulatory Visit: Payer: Self-pay | Admitting: Family Medicine

## 2012-11-27 ENCOUNTER — Telehealth: Payer: Self-pay | Admitting: Family Medicine

## 2012-11-27 ENCOUNTER — Other Ambulatory Visit: Payer: Self-pay | Admitting: Family Medicine

## 2012-11-28 ENCOUNTER — Other Ambulatory Visit: Payer: Self-pay | Admitting: Family Medicine

## 2012-11-28 MED ORDER — GLUCOSE BLOOD VI STRP: ORAL_STRIP | Status: DC

## 2012-11-28 MED ORDER — ZOLPIDEM TARTRATE 5 MG PO TABS: ORAL_TABLET | ORAL | Status: DC

## 2012-11-28 NOTE — Telephone Encounter (Signed)
Prescription renewed in EPIC. 

## 2012-11-28 NOTE — Telephone Encounter (Signed)
Sarah Skinner notified that [place] and test strips called to [facility].

## 2012-12-05 ENCOUNTER — Encounter (HOSPITAL_COMMUNITY): Payer: Self-pay | Admitting: Emergency Medicine

## 2012-12-05 ENCOUNTER — Emergency Department (HOSPITAL_COMMUNITY)
Admission: EM | Admit: 2012-12-05 | Discharge: 2012-12-05 | Disposition: A | Discharge status: Home / Self Care | Payer: Medicare Other | Attending: Emergency Medicine | Admitting: Emergency Medicine

## 2012-12-05 ENCOUNTER — Other Ambulatory Visit: Payer: Self-pay | Admitting: Family Medicine

## 2012-12-05 ENCOUNTER — Emergency Department (HOSPITAL_COMMUNITY): Payer: Medicare Other

## 2012-12-05 DIAGNOSIS — I10 Essential (primary) hypertension: Secondary | ICD-10-CM | POA: Insufficient documentation

## 2012-12-05 DIAGNOSIS — Z79899 Other long term (current) drug therapy: Secondary | ICD-10-CM | POA: Insufficient documentation

## 2012-12-05 DIAGNOSIS — E039 Hypothyroidism, unspecified: Secondary | ICD-10-CM | POA: Insufficient documentation

## 2012-12-05 DIAGNOSIS — R110 Nausea: Secondary | ICD-10-CM | POA: Insufficient documentation

## 2012-12-05 DIAGNOSIS — F419 Anxiety disorder, unspecified: Secondary | ICD-10-CM

## 2012-12-05 DIAGNOSIS — Z880 Allergy status to penicillin: Secondary | ICD-10-CM | POA: Insufficient documentation

## 2012-12-05 DIAGNOSIS — Z87891 Personal history of nicotine dependence: Secondary | ICD-10-CM | POA: Insufficient documentation

## 2012-12-05 DIAGNOSIS — K219 Gastro-esophageal reflux disease without esophagitis: Secondary | ICD-10-CM | POA: Insufficient documentation

## 2012-12-05 DIAGNOSIS — Z8673 Personal history of transient ischemic attack (TIA), and cerebral infarction without residual deficits: Secondary | ICD-10-CM | POA: Insufficient documentation

## 2012-12-05 DIAGNOSIS — E119 Type 2 diabetes mellitus without complications: Secondary | ICD-10-CM | POA: Insufficient documentation

## 2012-12-05 DIAGNOSIS — Z91040 Latex allergy status: Secondary | ICD-10-CM | POA: Insufficient documentation

## 2012-12-05 DIAGNOSIS — F411 Generalized anxiety disorder: Secondary | ICD-10-CM | POA: Insufficient documentation

## 2012-12-05 DIAGNOSIS — Z8739 Personal history of other diseases of the musculoskeletal system and connective tissue: Secondary | ICD-10-CM | POA: Insufficient documentation

## 2012-12-05 LAB — CBC WITH DIFFERENTIAL/PLATELET
Basophils Absolute: 0 10*3/uL (ref 0.0–0.1)
Basophils Relative: 0 % (ref 0–1)
Eosinophils Absolute: 0 10*3/uL (ref 0.0–0.7)
HCT: 44.7 % (ref 36.0–46.0)
Hemoglobin: 15.5 g/dL — ABNORMAL HIGH (ref 12.0–15.0)
MCH: 31.1 pg (ref 26.0–34.0)
MCHC: 34.7 g/dL (ref 30.0–36.0)
Monocytes Absolute: 0.7 10*3/uL (ref 0.1–1.0)
Monocytes Relative: 6 % (ref 3–12)
Neutro Abs: 8.6 10*3/uL — ABNORMAL HIGH (ref 1.7–7.7)
Platelets: 233 10*3/uL (ref 150–400)
RDW: 13.1 % (ref 11.5–15.5)

## 2012-12-05 LAB — COMPREHENSIVE METABOLIC PANEL
ALT: 54 U/L — ABNORMAL HIGH (ref 0–35)
AST: 27 U/L (ref 0–37)
Albumin: 3.8 g/dL (ref 3.5–5.2)
BUN: 11 mg/dL (ref 6–23)
Calcium: 9.7 mg/dL (ref 8.4–10.5)
Chloride: 94 mEq/L — ABNORMAL LOW (ref 96–112)
Creatinine, Ser: 0.83 mg/dL (ref 0.50–1.10)
GFR calc Af Amer: 80 mL/min — ABNORMAL LOW (ref >90)
GFR calc non Af Amer: 69 mL/min — ABNORMAL LOW (ref >90)
Total Protein: 7.2 g/dL (ref 6.0–8.3)

## 2012-12-05 LAB — GLUCOSE, CAPILLARY: Glucose-Capillary: 129 mg/dL — ABNORMAL HIGH (ref 70–99)

## 2012-12-05 MED ORDER — LORAZEPAM 2 MG/ML IJ SOLN
1.0000 mg | Freq: Once | INTRAMUSCULAR | Status: AC
  Administered 2012-12-05: 1 mg via INTRAMUSCULAR
  Filled 2012-12-05: qty 1

## 2012-12-05 NOTE — ED Notes (Signed)
Patient left ED at this time with [facility] for transport home.

## 2012-12-05 NOTE — ED Notes (Signed)
EMS reports pt had some changes to her medications yesterday and pt started trembling.  Pt says doesn't know what changed about her medications.

## 2012-12-05 NOTE — ED Notes (Addendum)
Family states she has be transported back home via EMS because she is unable to sit in wheelchair due to shaking. Awaiting EMS transport at this time.

## 2012-12-05 NOTE — ED Notes (Signed)
Assisted pt with bedpan

## 2012-12-05 NOTE — ED Notes (Signed)
Assisted patient with urinal. Stated she had to urinate, patient did not urinate at this time.

## 2012-12-05 NOTE — ED Notes (Signed)
Patient repeatedly calling out to nurse's station requesting food, drink. Patient informed she could not have anything to eat or drink until assessed by MD and MD states she can. Patient upset stating that "I should have just gone to the funeral home, ya'll are going to let me die here." Patient provided comfort measures and informed patient that the MD would be with her a soon as he could.

## 2012-12-05 NOTE — ED Notes (Signed)
EMS at bedside for transport home.

## 2012-12-05 NOTE — ED Notes (Signed)
MD at bedside. 

## 2012-12-05 NOTE — ED Provider Notes (Signed)
CSN: [id number]     Arrival date & time 12/05/12  1006 History  This chart was scribed for Benny Lennert, MD by Quintella Reichert, ED scribe.  This patient was seen in room APA19/APA19 and the patient's care was started at 12:09 PM.   Chief Complaint  Patient presents with  . Tremors   Patient is a 72 y.o. female presenting with neurologic complaint. The history is provided by the patient and a relative. No language interpreter was used.  Neurologic Problem This is a recurrent problem. The current episode started yesterday. Episode frequency: intermittent. The problem has been gradually worsening. Pertinent negatives include no chest pain, no abdominal pain and no headaches. Associated symptoms comments: Tremors. Nothing aggravates the symptoms. Nothing relieves the symptoms. She has tried nothing for the symptoms.    HPI Comments: Sarah Skinner is a 72 y.o. female who presents to the Emergency Department complaining of tremors.  Family states pt has been trembling intermittently since yesterday.  This morning she was trembling on the right side. Pt has had similar symptoms intermittently "when she doesn't get her Xanax."  She takes 0.5 mg 3x/day and family denies recent missed doses before today.  She has not taken her Xanax today.  Pt was recently placed on bupropion.  PCP advised family that symptoms are not likely due to this medication and she should be brought to the ED.  She also complains of nausea but family states this is chronic.  She denies vomiting.     Past Medical History  Diagnosis Date  . Hypertension   . Diabetes mellitus   . Hypothyroidism   . Blood transfusion     hx of with childbirth   . Stress incontinence     stress incontinence   . GERD (gastroesophageal reflux disease)   . Headache(784.0)     hx of   . Fibromyalgia   . Anxiety   . Arthritis     hands, feet , knees   . Stroke     left sided weakness   . Anginal pain     not currently seeing  cardiologist    Past Surgical History  Procedure Laterality Date  . Abdominal hysterectomy    . Cholecystectomy    . Rectocele repair    . Cervical fusion    . Tubal ligation    . Wisdom tooth extraction    . Cystoscopy with injection  05/18/2011    Procedure: CYSTOSCOPY WITH INJECTION;  Surgeon: Anner Crete, MD;  Location: [facility];  Service: Urology;  Laterality: N/A;  Injection of Macroplastique  . Flexible sigmoidoscopy  08/18/2011    Procedure: FLEXIBLE SIGMOIDOSCOPY;  Surgeon: Charna Elizabeth, MD;  Location: [facility];  Service: Endoscopy;  Laterality: N/A;    Family History  Problem Relation Age of Onset  . Heart failure Mother   . COPD Father   . Diabetes Sister   . Heart failure Sister     History  Substance Use Topics  . Smoking status: Former Smoker -- 2.00 packs/day for 20 years    Types: Cigarettes    Quit date: 02/23/1991  . Smokeless tobacco: Never Used     Comment: Quite 20 Yrs Ago  . Alcohol Use: No     Comment: hx of no longer uses     OB History   Grav Para Term Preterm Abortions TAB SAB Ect Mult Living  Review of Systems  Constitutional: Negative for appetite change and fatigue.  HENT: Negative for congestion, ear discharge and sinus pressure.   Eyes: Negative for discharge.  Respiratory: Negative for cough.   Cardiovascular: Negative for chest pain.  Gastrointestinal: Positive for nausea. Negative for vomiting, abdominal pain and diarrhea.  Genitourinary: Negative for frequency and hematuria.  Musculoskeletal: Negative for back pain.  Skin: Negative for rash.  Neurological: Positive for tremors. Negative for seizures and headaches.  Psychiatric/Behavioral: Negative for hallucinations.     Allergies  Aspirin; Penicillins; Sulfa antibiotics; and Latex  Home Medications   Current Outpatient Rx  Name  Route  Sig  Dispense  Refill  . ALPRAZolam (XANAX) 0.5 MG tablet      TAKE ONE TABLET BY MOUTH THREE TIMES DAILY AS  NEEDED   100 tablet   2   . amLODipine (NORVASC) 5 MG tablet   Oral   Take 5 mg by mouth every morning.         Marland Kitchen buPROPion (WELLBUTRIN XL) 300 MG 24 hr tablet   Oral   Take 1 tablet (300 mg total) by mouth daily.   30 tablet   5   . cholecalciferol (VITAMIN D) 1000 UNITS tablet   Oral   Take 1,000 Units by mouth every morning.          . diclofenac (VOLTAREN) 50 MG EC tablet   Oral   Take 1 tablet (50 mg total) by mouth 2 (two) times daily.   60 tablet   5   . docusate sodium (COLACE) 100 MG capsule   Oral   Take 200 mg by mouth 2 (two) times daily.         Marland Kitchen escitalopram (LEXAPRO) 20 MG tablet   Oral   Take 10 mg by mouth daily.         Marland Kitchen KLOR-CON M20 20 MEQ tablet      TAKE ONE TABLET BY MOUTH EVERY DAY   30 tablet   2   . Lactobacillus Rhamnosus, GG, (CULTURELLE PO)   Oral   Take 1 tablet by mouth daily.         Marland Kitchen LANTUS 100 UNIT/ML injection      INJECT 31 UNITS AT BEDTIME   10 mL   2   . levothyroxine (SYNTHROID, LEVOTHROID) 50 MCG tablet   Oral   Take 50 mcg by mouth AC breakfast.          . loratadine (CLARITIN) 10 MG tablet   Oral   Take 10 mg by mouth daily.          . metoprolol (LOPRESSOR) 50 MG tablet      TAKE ONE TABLET BY MOUTH TWICE DAILY   60 tablet   5   . mirabegron ER (MYRBETRIQ) 25 MG TB24   Oral   Take 25 mg by mouth daily.         . Multiple Vitamins-Minerals (MULTIVITAMIN & MINERAL PO)   Oral   Take 1 tablet by mouth daily with lunch. Multivitamin with calcium 1000mg , magnesium 400mg  and zinc 15 mg per daughter.         Marland Kitchen NOVOLOG 100 UNIT/ML injection      INJECT 3 UNITS BEFORE EACH MEAL   10 mL   2   . Omega-3 Fatty Acids (FISH OIL) 1000 MG CAPS   Oral   Take 1 capsule by mouth 4 (four) times daily.          Marland Kitchen  pantoprazole (PROTONIX) 40 MG tablet      TAKE ONE TABLET BY MOUTH EVERY DAY   30 tablet   5   . polyethylene glycol (MIRALAX / GLYCOLAX) packet   Oral   Take 17 g by mouth  daily.         . rosuvastatin (CRESTOR) 10 MG tablet   Oral   Take 10 mg by mouth every other day.         . zolpidem (AMBIEN) 5 MG tablet      TAKE ONE TABLET BY MOUTH AT BEDTIME   30 tablet   2    BP 117/76  Pulse 87  Temp(Src) 98.8 F (37.1 C) (Oral)  Resp 14  Ht 5\' 2"  (1.575 m)  Wt 195 lb (88.451 kg)  BMI 35.66 kg/m2  SpO2 96%  Physical Exam  Nursing note and vitals reviewed. Constitutional: She is oriented to person, place, and time. She appears well-developed.  HENT:  Head: Normocephalic.  Eyes: Conjunctivae and EOM are normal. No scleral icterus.  Neck: Neck supple. No thyromegaly present.  Cardiovascular: Normal rate and regular rhythm.  Exam reveals no gallop and no friction rub.   No murmur heard. Pulmonary/Chest: No stridor. She has no wheezes. She has no rales. She exhibits no tenderness.  Abdominal: She exhibits no distension. There is no tenderness. There is no rebound.  Musculoskeletal: Normal range of motion. She exhibits no edema.  Lymphadenopathy:    She has no cervical adenopathy.  Neurological: She is oriented to person, place, and time. She exhibits normal muscle tone. Coordination normal.  Minor constant tremor to entire body  Skin: No rash noted. No erythema.  Psychiatric: She has a normal mood and affect. Her behavior is normal.    ED Course  Procedures (including critical care time)  DIAGNOSTIC STUDIES: Oxygen Saturation is 96% on room air, normal by my interpretation.    COORDINATION OF CARE: 12:14 PM-Discussed treatment plan which includes Ativan, labs and imaging with pt and family at bedside and they agreed to plan.    Labs Review Labs Reviewed  CBC WITH DIFFERENTIAL - Abnormal; Notable for the following:    WBC 11.5 (*)    Hemoglobin 15.5 (*)    Neutro Abs 8.6 (*)    All other components within normal limits  COMPREHENSIVE METABOLIC PANEL - Abnormal; Notable for the following:    Sodium 134 (*)    Chloride 94 (*)     Glucose, Bld 145 (*)    ALT 54 (*)    GFR calc non Af Amer 69 (*)    GFR calc Af Amer 80 (*)    All other components within normal limits  GLUCOSE, CAPILLARY - Abnormal; Notable for the following:    Glucose-Capillary 129 (*)    All other components within normal limits    Imaging Review Ct Head Wo Contrast  12/05/2012   CLINICAL DATA:  Persistent tremors  EXAM: CT HEAD WITHOUT CONTRAST  TECHNIQUE: Contiguous axial images were obtained from the base of the skull through the vertex without intravenous contrast. Study was obtained within 24 hr of patient's arrival at the emergency department.  COMPARISON:  June 21, 2011  FINDINGS: Moderate atrophy is stable. There is no mass, hemorrhage, extra-axial fluid collection, or midline shift. There is small vessel disease throughout the centra semiovale bilaterally, stable. There is evidence of a prior infarct in the right inferior putaminal region, stable. The compared to the prior study, this area of decreased attenuation is  larger and less well-defined than on the previous study. There may well be both acute and prior infarct in the right putamen. No other findings suspicious for potential acute infarct on this study.  IMPRESSION: Moderate atrophy with extensive supratentorial small vessel disease. Suspect both prior and more recent infarct involving portions of the right putamen. No other findings suspicious for potential acute infarct. No hemorrhage or mass effect.   Electronically Signed   By: Bretta Bang M.D.   On: 12/05/2012 14:33    EKG Interpretation   None       MDM  No diagnosis found.    The chart was scribed for me under my direct supervision.  I personally performed the history, physical, and medical decision making and all procedures in the evaluation of this patient.Benny Lennert, MD 12/06/12 [phone]

## 2012-12-11 ENCOUNTER — Telehealth (HOSPITAL_COMMUNITY): Payer: Self-pay | Admitting: *Deleted

## 2012-12-11 NOTE — Telephone Encounter (Signed)
JY:[id number] states still not sleeping.Since Bupropion increased, more energy at night.Not any happier than before.Can she either stop medicine or return to previous dose? Went to ED about shaking/tremors.Diagnosed with anxiety. Message left for Dr.Plovsky on VM at [phone], [phone]

## 2012-12-11 NOTE — Telephone Encounter (Signed)
Left message to stop all Wellbutrin and to call me at [facility] behavior OP  This  Fridaay in the AM

## 2012-12-15 ENCOUNTER — Other Ambulatory Visit: Payer: Self-pay | Admitting: Family Medicine

## 2012-12-18 ENCOUNTER — Telehealth (HOSPITAL_COMMUNITY): Payer: Self-pay | Admitting: *Deleted

## 2012-12-18 ENCOUNTER — Other Ambulatory Visit: Payer: Self-pay

## 2012-12-18 NOTE — Telephone Encounter (Signed)
ZO:[id number] Bupropion as directed.Calling with update.Energy is agressive.Not slleping well,but wants to stay in bed.Very demanding.Stating she is in pain.Wants to know next step. Left message with this information at [phone] at [phone]. 12/18/12 @ 1315: Provider called office in response to message left earlier by RN with info left by daughter in previous call.Provider asked for daughter's number, states he will contact daughter

## 2012-12-18 NOTE — Telephone Encounter (Signed)
Directions in [facility] said BID  Fax from pharmacy said 3 times a day for 30 days  FPW

## 2012-12-19 ENCOUNTER — Telehealth (HOSPITAL_COMMUNITY): Payer: Self-pay | Admitting: *Deleted

## 2012-12-19 MED ORDER — DICLOFENAC SODIUM 50 MG PO TBEC: 50.0000 mg | DELAYED_RELEASE_TABLET | Freq: Two times a day (BID) | ORAL | Status: DC

## 2012-12-19 NOTE — Telephone Encounter (Signed)
Daughter left AV:WUJWJX started pt on Mirtazapine as directed by MD yesterday.Calmer and slept better.Difficulty waking up this morning.Should they cut the pill in half?

## 2012-12-19 NOTE — Telephone Encounter (Signed)
Call patient : Prescription refilled & sent to pharmacy in [facility]. 

## 2012-12-20 NOTE — Telephone Encounter (Signed)
Per Dr Read Drivers instructions, contacted daughter, left message: Advised that as pt gets used to medicine in 1-2 weeks, side effect of morning drowsiness should improve.Pt should stay on medication. Encouraged to contact office for further concerns or questions.

## 2012-12-21 ENCOUNTER — Telehealth: Payer: Self-pay | Admitting: Family Medicine

## 2012-12-22 DIAGNOSIS — E119 Type 2 diabetes mellitus without complications: Secondary | ICD-10-CM

## 2012-12-22 DIAGNOSIS — M069 Rheumatoid arthritis, unspecified: Secondary | ICD-10-CM

## 2012-12-22 DIAGNOSIS — I10 Essential (primary) hypertension: Secondary | ICD-10-CM

## 2012-12-22 DIAGNOSIS — I69939 Monoplegia of upper limb following unspecified cerebrovascular disease affecting unspecified side: Secondary | ICD-10-CM

## 2012-12-26 ENCOUNTER — Other Ambulatory Visit (INDEPENDENT_AMBULATORY_CARE_PROVIDER_SITE_OTHER): Payer: PRIVATE HEALTH INSURANCE

## 2012-12-26 ENCOUNTER — Other Ambulatory Visit: Payer: Medicare Other

## 2012-12-26 DIAGNOSIS — R350 Frequency of micturition: Secondary | ICD-10-CM

## 2012-12-26 LAB — POCT URINALYSIS DIPSTICK
Bilirubin, UA: NEGATIVE
Bilirubin, UA: NEGATIVE
Blood, UA: NEGATIVE
Blood, UA: NEGATIVE
Glucose, UA: NEGATIVE
Glucose, UA: NEGATIVE
Ketones, UA: NEGATIVE
Ketones, UA: NEGATIVE
Leukocytes, UA: NEGATIVE
Leukocytes, UA: NEGATIVE
Nitrite, UA: NEGATIVE
Nitrite, UA: NEGATIVE
Protein, UA: NEGATIVE
Protein, UA: NEGATIVE
Spec Grav, UA: 1.01
Spec Grav, UA: 1.01
Urobilinogen, UA: NEGATIVE
Urobilinogen, UA: NEGATIVE
pH, UA: 7
pH, UA: 7

## 2012-12-26 LAB — POCT UA - MICROSCOPIC ONLY
Casts, Ur, LPF, POC: NEGATIVE
Crystals, Ur, HPF, POC: NEGATIVE
Yeast, UA: NEGATIVE

## 2012-12-27 NOTE — Progress Notes (Signed)
Quick Note:  Call patient. Labs normal. No change in plan. ______ 

## 2013-01-08 ENCOUNTER — Emergency Department (HOSPITAL_COMMUNITY)
Admission: EM | Admit: 2013-01-08 | Discharge: 2013-01-08 | Disposition: A | Discharge status: Home / Self Care | Payer: Medicare Other | Attending: Emergency Medicine | Admitting: Emergency Medicine

## 2013-01-08 ENCOUNTER — Emergency Department (HOSPITAL_COMMUNITY): Payer: Medicare Other

## 2013-01-08 ENCOUNTER — Encounter (HOSPITAL_COMMUNITY): Payer: Self-pay | Admitting: Emergency Medicine

## 2013-01-08 DIAGNOSIS — F411 Generalized anxiety disorder: Secondary | ICD-10-CM | POA: Insufficient documentation

## 2013-01-08 DIAGNOSIS — I10 Essential (primary) hypertension: Secondary | ICD-10-CM | POA: Insufficient documentation

## 2013-01-08 DIAGNOSIS — Z87891 Personal history of nicotine dependence: Secondary | ICD-10-CM | POA: Insufficient documentation

## 2013-01-08 DIAGNOSIS — Z880 Allergy status to penicillin: Secondary | ICD-10-CM | POA: Insufficient documentation

## 2013-01-08 DIAGNOSIS — R109 Unspecified abdominal pain: Secondary | ICD-10-CM | POA: Insufficient documentation

## 2013-01-08 DIAGNOSIS — Z79899 Other long term (current) drug therapy: Secondary | ICD-10-CM | POA: Insufficient documentation

## 2013-01-08 DIAGNOSIS — R300 Dysuria: Secondary | ICD-10-CM | POA: Insufficient documentation

## 2013-01-08 DIAGNOSIS — M1710 Unilateral primary osteoarthritis, unspecified knee: Secondary | ICD-10-CM | POA: Insufficient documentation

## 2013-01-08 DIAGNOSIS — Z8673 Personal history of transient ischemic attack (TIA), and cerebral infarction without residual deficits: Secondary | ICD-10-CM | POA: Insufficient documentation

## 2013-01-08 DIAGNOSIS — G8929 Other chronic pain: Secondary | ICD-10-CM | POA: Insufficient documentation

## 2013-01-08 DIAGNOSIS — Z791 Long term (current) use of non-steroidal anti-inflammatories (NSAID): Secondary | ICD-10-CM | POA: Insufficient documentation

## 2013-01-08 DIAGNOSIS — E119 Type 2 diabetes mellitus without complications: Secondary | ICD-10-CM | POA: Insufficient documentation

## 2013-01-08 DIAGNOSIS — M259 Joint disorder, unspecified: Secondary | ICD-10-CM | POA: Insufficient documentation

## 2013-01-08 DIAGNOSIS — E039 Hypothyroidism, unspecified: Secondary | ICD-10-CM | POA: Insufficient documentation

## 2013-01-08 DIAGNOSIS — K5900 Constipation, unspecified: Secondary | ICD-10-CM | POA: Insufficient documentation

## 2013-01-08 DIAGNOSIS — K219 Gastro-esophageal reflux disease without esophagitis: Secondary | ICD-10-CM | POA: Insufficient documentation

## 2013-01-08 DIAGNOSIS — M19049 Primary osteoarthritis, unspecified hand: Secondary | ICD-10-CM | POA: Insufficient documentation

## 2013-01-08 DIAGNOSIS — Z91040 Latex allergy status: Secondary | ICD-10-CM | POA: Insufficient documentation

## 2013-01-08 LAB — CBC WITH DIFFERENTIAL/PLATELET
Basophils Absolute: 0.1 10*3/uL (ref 0.0–0.1)
Basophils Relative: 1 % (ref 0–1)
Eosinophils Absolute: 0.1 10*3/uL (ref 0.0–0.7)
MCH: 30.7 pg (ref 26.0–34.0)
MCHC: 34.1 g/dL (ref 30.0–36.0)
Monocytes Absolute: 1 10*3/uL (ref 0.1–1.0)
Neutro Abs: 5.1 10*3/uL (ref 1.7–7.7)
Neutrophils Relative %: 58 % (ref 43–77)
RDW: 13.2 % (ref 11.5–15.5)
WBC: 8.9 10*3/uL (ref 4.0–10.5)

## 2013-01-08 LAB — COMPREHENSIVE METABOLIC PANEL
ALT: 67 U/L — ABNORMAL HIGH (ref 0–35)
AST: 36 U/L (ref 0–37)
Albumin: 3.6 g/dL (ref 3.5–5.2)
Alkaline Phosphatase: 82 U/L (ref 39–117)
BUN: 8 mg/dL (ref 6–23)
Chloride: 100 mEq/L (ref 96–112)
Potassium: 3.8 mEq/L (ref 3.5–5.1)
Sodium: 137 mEq/L (ref 135–145)
Total Bilirubin: 0.5 mg/dL (ref 0.3–1.2)

## 2013-01-08 LAB — LIPASE, BLOOD: Lipase: 23 U/L (ref 11–59)

## 2013-01-08 LAB — URINALYSIS W MICROSCOPIC + REFLEX CULTURE
Bilirubin Urine: NEGATIVE
Glucose, UA: NEGATIVE mg/dL
Leukocytes, UA: NEGATIVE
Protein, ur: NEGATIVE mg/dL
Specific Gravity, Urine: 1.012 (ref 1.005–1.030)
Urine-Other: NONE SEEN
Urobilinogen, UA: 0.2 mg/dL (ref 0.0–1.0)

## 2013-01-08 MED ORDER — MORPHINE SULFATE 4 MG/ML IJ SOLN
4.0000 mg | Freq: Once | INTRAMUSCULAR | Status: AC
  Administered 2013-01-08: 4 mg via INTRAVENOUS
  Filled 2013-01-08: qty 1

## 2013-01-08 MED ORDER — ONDANSETRON HCL 4 MG/2ML IJ SOLN: 4.0000 mg | Freq: Once | INTRAMUSCULAR | Status: DC

## 2013-01-08 MED ORDER — MORPHINE SULFATE 4 MG/ML IJ SOLN
4.0000 mg | Freq: Once | INTRAMUSCULAR | Status: DC
  Filled 2013-01-08: qty 1

## 2013-01-08 MED ORDER — ONDANSETRON HCL 4 MG/2ML IJ SOLN
4.0000 mg | Freq: Once | INTRAMUSCULAR | Status: AC
  Administered 2013-01-08: 4 mg via INTRAVENOUS
  Filled 2013-01-08: qty 2

## 2013-01-08 MED ORDER — MORPHINE SULFATE 4 MG/ML IJ SOLN: 6.0000 mg | Freq: Once | INTRAMUSCULAR | Status: DC

## 2013-01-08 MED ORDER — ONDANSETRON HCL 4 MG/2ML IJ SOLN
4.0000 mg | Freq: Once | INTRAMUSCULAR | Status: DC
  Filled 2013-01-08: qty 2

## 2013-01-08 NOTE — ED Notes (Signed)
Pt states that she has been having abdominal pain x 2-3 months. Hx of constipation. Seen at PCP. Could not get meds filled at pharmacy.

## 2013-01-08 NOTE — ED Provider Notes (Signed)
CSN: [id number]     Arrival date & time 01/08/13  1712 History   First MD Initiated Contact with Patient 01/08/13 1738     Chief Complaint  Patient presents with  . Abdominal Pain   (Consider location/radiation/quality/duration/timing/severity/associated sxs/prior Treatment) HPI Sarah Skinner is a 72 y.o. female who presents to emergency with complaint of abdominal pain for "several months." States hx of the same. States pain is diffuse. States has difficulty having bowel movement, states took 4 laxitives and miralax and had a large bowel movement today which did not help her pain. She denies any nausea, vomiting, diarrhea. No fever, chills. States does have pain with urination. States went to see a pain specialist who started her on nucenta but hasnot filled it yet, because of insurance problems. States today pain worsened.   Past Medical History  Diagnosis Date  . Hypertension   . Diabetes mellitus   . Hypothyroidism   . Blood transfusion     hx of with childbirth   . Stress incontinence     stress incontinence   . GERD (gastroesophageal reflux disease)   . Headache(784.0)     hx of   . Fibromyalgia   . Anxiety   . Arthritis     hands, feet , knees   . Stroke     left sided weakness   . Anginal pain     not currently seeing cardiologist   Past Surgical History  Procedure Laterality Date  . Abdominal hysterectomy    . Cholecystectomy    . Rectocele repair    . Cervical fusion    . Tubal ligation    . Wisdom tooth extraction    . Cystoscopy with injection  05/18/2011    Procedure: CYSTOSCOPY WITH INJECTION;  Surgeon: Anner Crete, MD;  Location: [facility];  Service: Urology;  Laterality: N/A;  Injection of Macroplastique  . Flexible sigmoidoscopy  08/18/2011    Procedure: FLEXIBLE SIGMOIDOSCOPY;  Surgeon: Charna Elizabeth, MD;  Location: [facility];  Service: Endoscopy;  Laterality: N/A;   Family History  Problem Relation Age of Onset  . Heart failure Mother   . COPD  Father   . Diabetes Sister   . Heart failure Sister    History  Substance Use Topics  . Smoking status: Former Smoker -- 2.00 packs/day for 20 years    Types: Cigarettes    Quit date: 02/23/1991  . Smokeless tobacco: Never Used     Comment: Quite 20 Yrs Ago  . Alcohol Use: No     Comment: hx of no longer uses    OB History   Grav Para Term Preterm Abortions TAB SAB Ect Mult Living                 Review of Systems  Constitutional: Negative for fever and chills.  Respiratory: Negative for cough, chest tightness and shortness of breath.   Cardiovascular: Negative for chest pain, palpitations and leg swelling.  Gastrointestinal: Positive for abdominal pain, constipation and abdominal distention. Negative for nausea, vomiting, diarrhea and blood in stool.  Genitourinary: Positive for dysuria. Negative for hematuria and flank pain.  Musculoskeletal: Negative for arthralgias, myalgias, neck pain and neck stiffness.  Skin: Negative for rash.  Neurological: Negative for dizziness, weakness and headaches.  All other systems reviewed and are negative.    Allergies  Aspirin; Penicillins; Sulfa antibiotics; and Latex  Home Medications   Current Outpatient Rx  Name  Route  Sig  Dispense  Refill  .  ALPRAZolam (XANAX) 0.5 MG tablet   Oral   Take 0.5 mg by mouth 3 (three) times daily as needed for anxiety.         Marland Kitchen amLODipine (NORVASC) 5 MG tablet   Oral   Take 5 mg by mouth every morning.         . cholecalciferol (VITAMIN D) 1000 UNITS tablet   Oral   Take 1,000 Units by mouth every morning.          . diclofenac (VOLTAREN) 50 MG EC tablet   Oral   Take 1 tablet (50 mg total) by mouth 2 (two) times daily.   60 tablet   2   . docusate sodium (COLACE) 100 MG capsule   Oral   Take 200 mg by mouth 2 (two) times daily.         . Lactobacillus Rhamnosus, GG, (CULTURELLE PO)   Oral   Take 1 tablet by mouth daily.         Marland Kitchen LANTUS 100 UNIT/ML injection       INJECT 31 UNITS AT BEDTIME   10 mL   2   . levothyroxine (SYNTHROID, LEVOTHROID) 50 MCG tablet   Oral   Take 50 mcg by mouth AC breakfast.          . loratadine (CLARITIN) 10 MG tablet   Oral   Take 10 mg by mouth daily.          . metoprolol (LOPRESSOR) 50 MG tablet   Oral   Take 50 mg by mouth 2 (two) times daily.         . mirabegron ER (MYRBETRIQ) 25 MG TB24   Oral   Take 25 mg by mouth daily.         . mirtazapine (REMERON) 30 MG tablet   Oral   Take 30 mg by mouth at bedtime.         . Multiple Vitamins-Minerals (MULTIVITAMIN & MINERAL PO)   Oral   Take 1 tablet by mouth daily with lunch. Multivitamin with calcium 1000mg , magnesium 400mg  and zinc 15 mg per daughter.         Marland Kitchen NOVOLOG 100 UNIT/ML injection      INJECT 3 UNITS BEFORE EACH MEAL   10 mL   2   . Omega-3 Fatty Acids (FISH OIL) 1000 MG CAPS   Oral   Take 1 capsule by mouth 4 (four) times daily.          . pantoprazole (PROTONIX) 40 MG tablet   Oral   Take 40 mg by mouth daily.         . polyethylene glycol (MIRALAX / GLYCOLAX) packet   Oral   Take 17 g by mouth daily.         . potassium chloride SA (K-DUR,KLOR-CON) 20 MEQ tablet   Oral   Take 20 mEq by mouth daily.         . rosuvastatin (CRESTOR) 10 MG tablet   Oral   Take 10 mg by mouth every other day. At bedtime         . zolpidem (AMBIEN) 5 MG tablet   Oral   Take 5 mg by mouth at bedtime as needed for sleep.          BP 160/58  Pulse 78  Temp(Src) 98.9 F (37.2 C) (Oral)  Resp 20  SpO2 99% Physical Exam  Nursing note and vitals reviewed. Constitutional: She is oriented to person, place, and  time. She appears well-developed and well-nourished. No distress.  HENT:  Head: Normocephalic.  Eyes: Conjunctivae are normal.  Neck: Neck supple.  Cardiovascular: Normal rate, regular rhythm and normal heart sounds.   Pulmonary/Chest: Effort normal and breath sounds normal. No respiratory distress. She has no  wheezes. She has no rales.  Abdominal: Soft. Bowel sounds are normal. There is tenderness. There is no rebound and no guarding.  Obese. Diffuse tenderness. No CVA tenderness bilaterally  Neurological: She is alert and oriented to person, place, and time.  Skin: Skin is warm and dry.    ED Course  Procedures (including critical care time) Labs Review Labs Reviewed  COMPREHENSIVE METABOLIC PANEL - Abnormal; Notable for the following:    Glucose, Bld 132 (*)    ALT 67 (*)    GFR calc non Af Amer 83 (*)    All other components within normal limits  URINALYSIS W MICROSCOPIC + REFLEX CULTURE  CBC WITH DIFFERENTIAL  LIPASE, BLOOD   Imaging Review Dg Abd Acute W/chest  01/08/2013   CLINICAL DATA:  Abdominal pain.  EXAM: ACUTE ABDOMEN SERIES (ABDOMEN 2 VIEW & CHEST 1 VIEW)  COMPARISON:  CT scan 06/11/2012  FINDINGS: The heart is mildly enlarged but stable. There is tortuosity, ectasia and calcification of the thoracic aorta. The lungs are clear.  The abdominal bowel gas pattern is unremarkable. No findings for obstruction an or perforation. The soft tissue shadows are grossly maintained. Vascular calcifications are noted. The bony structures are intact.  IMPRESSION: No acute cardiopulmonary findings.  No plain film findings for an acute abdominal process.   Electronically Signed   By: Loralie Champagne M.D.   On: 01/08/2013 19:14    EKG Interpretation   None       MDM   1. Chronic abdominal pain     Pt with abdominal pain and constipation for several months. She also admits to chronic abdominal pain for which she was referred to pain management. She was given pain medication which she has not filled yet. She states that her symptoms similar to her prior. She reports no vomiting or diarrhea. She denies any fever. Her evaluation today showed diffusely tender abdomen with no guarding. Her lab work is unremarkable. Her acute abdomen is negative. I discussed with Dr. Silverio Lay who went and saw the  patient. Agrees that at this time no further imaging or studies indicated. Patient will be discharged home with close followup with her primary care doctor. Instructed to return if worsening symptoms, fever, vomiting, blood stools.   Filed Vitals:   01/08/13 1654 01/08/13 1723 01/08/13 1740 01/08/13 2225  BP:  150/62 160/58 155/84  Pulse:  78 78 74  Temp:  98.9 F (37.2 C)    TempSrc:  Oral    Resp:  20 20 16   SpO2: 98% 98% 99% 100%       Lottie Mussel, PA-C 01/08/13 2349

## 2013-01-10 ENCOUNTER — Ambulatory Visit (INDEPENDENT_AMBULATORY_CARE_PROVIDER_SITE_OTHER): Payer: 59 | Admitting: Psychiatry

## 2013-01-10 ENCOUNTER — Ambulatory Visit (HOSPITAL_COMMUNITY): Payer: Self-pay | Admitting: Psychiatry

## 2013-01-10 DIAGNOSIS — F331 Major depressive disorder, recurrent, moderate: Secondary | ICD-10-CM | POA: Insufficient documentation

## 2013-01-10 DIAGNOSIS — F332 Major depressive disorder, recurrent severe without psychotic features: Secondary | ICD-10-CM

## 2013-01-10 NOTE — Progress Notes (Signed)
[facility] MD Progress Note  01/10/2013 4:31 PM Sarah Skinner  MRN:  [id number] Subjective:  Miserable Today unfortunately the patient had we thought cancer appointment and walked in unexpected. She of course came in in her wheelchair with her husband and her daughter. She was seen for an abbreviated focused visit. The Wellbutrin prescribed she was unable to tolerate and was changed 3 weeks ago to Remeron. Hence it has not been fully trialed. The patient describes significant pain in her right arm and right leg and recently saw her primary care Dr. and saw a pain specialist this past week. She was started on a new pain medication that she is yet to obtain from the pharmacy. They apparently have identified some physical explanation for her pain. Therefore I'm not sure that this is related to being depressed. The patient acknowledges that if her pain was gone she feels much much better. She actually denies persistent daily depression but rather describes pain as inducing a state of feeling sad. She cries from the pain. The patient is sleeping and eating fine. She has good energy and can concentrate. She denies being worthless and she denies suicidal thinking. Her anxiety is well contained with Xanax 3 times a day. The patient has been on the Remeron therefore only 3 weeks. I shared with him that it has been more time to work and that perhaps the pain medication with a rate of her pain and she'll overall P. much better. Diagnosis:   DSM5: Schizophrenia Disorders:   Obsessive-Compulsive Disorders:   Trauma-Stressor Disorders:   Substance/Addictive Disorders:   Depressive Disorders:  Major Depressive Disorder - Mild (296.21)  Axis I: Major Depression, Recurrent severe  ADL's:  Intact  Sleep: Good  Appetite:  Good  Suicidal Ideation:  no Homicidal Ideation:  no AEB (as evidenced by):  Psychiatric Specialty Exam: ROS  There were no vitals taken for this visit.There is no weight on file to  calculate BMI.  General Appearance: Casual  Eye Contact::  Good  Speech:  Normal Rate  Volume:  Decreased  Mood:  Depressed  Affect:  Blunt  Thought Process:  Coherent  Orientation:  Full (Time, Place, and Person)  Thought Content:  NA and WDL  Suicidal Thoughts:  No  Homicidal Thoughts:  No  Memory:  nl  Judgement:  Fair  Insight:  Good  Psychomotor Activity:  Normal  Concentration:  Good  Recall:  Good  Akathisia:  No  Handed:  Right  AIMS (if indicated):     Assets:  Desire for Improvement  Sleep:      Current Medications: Current Outpatient Prescriptions  Medication Sig Dispense Refill  . ALPRAZolam (XANAX) 0.5 MG tablet Take 0.5 mg by mouth 3 (three) times daily as needed for anxiety.      Marland Kitchen amLODipine (NORVASC) 5 MG tablet Take 5 mg by mouth every morning.      . cholecalciferol (VITAMIN D) 1000 UNITS tablet Take 1,000 Units by mouth every morning.       . diclofenac (VOLTAREN) 50 MG EC tablet Take 1 tablet (50 mg total) by mouth 2 (two) times daily.  60 tablet  2  . docusate sodium (COLACE) 100 MG capsule Take 200 mg by mouth 2 (two) times daily.      . Lactobacillus Rhamnosus, GG, (CULTURELLE PO) Take 1 tablet by mouth daily.      Marland Kitchen LANTUS 100 UNIT/ML injection INJECT 31 UNITS AT BEDTIME  10 mL  2  . levothyroxine (SYNTHROID, LEVOTHROID)  50 MCG tablet Take 50 mcg by mouth AC breakfast.       . loratadine (CLARITIN) 10 MG tablet Take 10 mg by mouth daily.       . metoprolol (LOPRESSOR) 50 MG tablet Take 50 mg by mouth 2 (two) times daily.      . mirabegron ER (MYRBETRIQ) 25 MG TB24 Take 25 mg by mouth daily.      . mirtazapine (REMERON) 30 MG tablet Take 30 mg by mouth at bedtime.      . Multiple Vitamins-Minerals (MULTIVITAMIN & MINERAL PO) Take 1 tablet by mouth daily with lunch. Multivitamin with calcium 1000mg , magnesium 400mg  and zinc 15 mg per daughter.      Marland Kitchen NOVOLOG 100 UNIT/ML injection INJECT 3 UNITS BEFORE EACH MEAL  10 mL  2  . Omega-3 Fatty Acids (FISH  OIL) 1000 MG CAPS Take 1 capsule by mouth 4 (four) times daily.       . pantoprazole (PROTONIX) 40 MG tablet Take 40 mg by mouth daily.      . polyethylene glycol (MIRALAX / GLYCOLAX) packet Take 17 g by mouth daily.      . potassium chloride SA (K-DUR,KLOR-CON) 20 MEQ tablet Take 20 mEq by mouth daily.      . rosuvastatin (CRESTOR) 10 MG tablet Take 10 mg by mouth every other day. At bedtime      . zolpidem (AMBIEN) 5 MG tablet Take 5 mg by mouth at bedtime as needed for sleep.      . [DISCONTINUED] fexofenadine (ALLEGRA) 180 MG tablet Take 180 mg by mouth every morning.        No current facility-administered medications for this visit.    Lab Results:  Results for orders placed during the hospital encounter of 01/08/13 (from the past 48 hour(s))  URINALYSIS W MICROSCOPIC + REFLEX CULTURE     Status: None   Collection Time    01/08/13  6:39 PM      Result Value Range   Color, Urine YELLOW  YELLOW   APPearance CLEAR  CLEAR   Specific Gravity, Urine 1.012  1.005 - 1.030   pH 7.0  5.0 - 8.0   Glucose, UA NEGATIVE  NEGATIVE mg/dL   Hgb urine dipstick NEGATIVE  NEGATIVE   Bilirubin Urine NEGATIVE  NEGATIVE   Ketones, ur NEGATIVE  NEGATIVE mg/dL   Protein, ur NEGATIVE  NEGATIVE mg/dL   Urobilinogen, UA 0.2  0.0 - 1.0 mg/dL   Nitrite NEGATIVE  NEGATIVE   Leukocytes, UA NEGATIVE  NEGATIVE   Urine-Other       Value: NO FORMED ELEMENTS SEEN ON URINE MICROSCOPIC EXAMINATION  CBC WITH DIFFERENTIAL     Status: None   Collection Time    01/08/13  6:40 PM      Result Value Range   WBC 8.9  4.0 - 10.5 K/uL   RBC 4.46  3.87 - 5.11 MIL/uL   Hemoglobin 13.7  12.0 - 15.0 g/dL   HCT 40.9  81.1 - 91.4 %   MCV 90.1  78.0 - 100.0 fL   MCH 30.7  26.0 - 34.0 pg   MCHC 34.1  30.0 - 36.0 g/dL   RDW 78.2  95.6 - 21.3 %   Platelets 212  150 - 400 K/uL   Neutrophils Relative % 58  43 - 77 %   Neutro Abs 5.1  1.7 - 7.7 K/uL   Lymphocytes Relative 30  12 - 46 %   Lymphs Abs 2.7  0.7 - 4.0 K/uL    Monocytes Relative 11  3 - 12 %   Monocytes Absolute 1.0  0.1 - 1.0 K/uL   Eosinophils Relative 1  0 - 5 %   Eosinophils Absolute 0.1  0.0 - 0.7 K/uL   Basophils Relative 1  0 - 1 %   Basophils Absolute 0.1  0.0 - 0.1 K/uL  COMPREHENSIVE METABOLIC PANEL     Status: Abnormal   Collection Time    01/08/13  6:40 PM      Result Value Range   Sodium 137  135 - 145 mEq/L   Potassium 3.8  3.5 - 5.1 mEq/L   Chloride 100  96 - 112 mEq/L   CO2 28  19 - 32 mEq/L   Glucose, Bld 132 (*) 70 - 99 mg/dL   BUN 8  6 - 23 mg/dL   Creatinine, Ser 1.61  0.50 - 1.10 mg/dL   Calcium 9.6  8.4 - 09.6 mg/dL   Total Protein 6.9  6.0 - 8.3 g/dL   Albumin 3.6  3.5 - 5.2 g/dL   AST 36  0 - 37 U/L   ALT 67 (*) 0 - 35 U/L   Alkaline Phosphatase 82  39 - 117 U/L   Total Bilirubin 0.5  0.3 - 1.2 mg/dL   GFR calc non Af Amer 83 (*) >90 mL/min   GFR calc Af Amer >90  >90 mL/min   Comment: (NOTE)     The eGFR has been calculated using the CKD EPI equation.     This calculation has not been validated in all clinical situations.     eGFR's persistently <90 mL/min signify possible Chronic Kidney     Disease.  LIPASE, BLOOD     Status: None   Collection Time    01/08/13  6:40 PM      Result Value Range   Lipase 23  11 - 59 U/L    Physical Findings: AIMS:  , ,  ,  ,    CIWA:    COWS:     Treatment Plan Summary: At this time she'll be noted that Wellbutrin is gone and the Lexapro was gone. The patient has been on 30 mg of Remeron only for 3 weeks. The patient continues take Xanax 0.5 mg 3 times a day. The patient is going to start a new pain medication. I asked the patient return to see me in 6-7 weeks we will reevaluate effectiveness of Remeron. She simply has not been on it long enough. It is my hope that she can control her pain through her pain specialist that her mood will be dramatically improved.  Plan:  Medical Decision Making Problem Points:  Established problem, worsening (2) Data Points:  Review of  medication regiment & side effects (2)  I certify that inpatient services furnished can reasonably be expected to improve the patient's condition.   Lorette Peterkin IRVING 01/10/2013, 4:31 PM

## 2013-01-11 ENCOUNTER — Ambulatory Visit: Payer: PRIVATE HEALTH INSURANCE | Admitting: Family Medicine

## 2013-01-11 ENCOUNTER — Telehealth: Payer: Self-pay | Admitting: Family Medicine
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
## 2013-01-13 NOTE — ED Provider Notes (Signed)
Medical screening examination/treatment/procedure(s) were conducted as a shared visit with non-physician practitioner(s) and myself.  I personally evaluated the patient during the encounter.  EKG Interpretation   None        Sarah Skinner is a 72 y.o. female hx of chronic abdominal pain s/p several normal CTs this year, here with worsening abdominal pain. Mild epigastric tenderness. Labs including lipase nl, xray nl. I think she has worsening of chronic abdominal pain. Patient hydrated, given pain meds and d/c home.    Richardean Canal, MD 01/13/13 1053

## 2013-01-15 ENCOUNTER — Ambulatory Visit: Payer: PRIVATE HEALTH INSURANCE | Admitting: Family Medicine

## 2013-01-23 ENCOUNTER — Other Ambulatory Visit: Payer: Self-pay | Admitting: Family Medicine

## 2013-02-09 ENCOUNTER — Telehealth: Payer: Self-pay | Admitting: Family Medicine

## 2013-02-12 ENCOUNTER — Telehealth (HOSPITAL_COMMUNITY): Payer: Self-pay

## 2013-02-13 ENCOUNTER — Other Ambulatory Visit: Payer: Self-pay | Admitting: *Deleted

## 2013-02-13 MED ORDER — METOPROLOL TARTRATE 50 MG PO TABS: 50.0000 mg | ORAL_TABLET | Freq: Two times a day (BID) | ORAL | Status: AC

## 2013-02-13 NOTE — Telephone Encounter (Signed)
Meds sent to pharmacy.

## 2013-02-16 ENCOUNTER — Other Ambulatory Visit: Payer: Self-pay | Admitting: Family Medicine

## 2013-02-27 ENCOUNTER — Ambulatory Visit: Payer: PRIVATE HEALTH INSURANCE | Admitting: Family Medicine

## 2013-02-27 ENCOUNTER — Ambulatory Visit (INDEPENDENT_AMBULATORY_CARE_PROVIDER_SITE_OTHER): Payer: Medicare Other | Admitting: Family Medicine

## 2013-02-27 ENCOUNTER — Encounter: Payer: Self-pay | Admitting: Family Medicine

## 2013-02-27 VITALS — BP 152/71 | HR 72 | Temp 97.8°F | Wt 190.0 lb

## 2013-02-27 DIAGNOSIS — K219 Gastro-esophageal reflux disease without esophagitis: Secondary | ICD-10-CM

## 2013-02-27 DIAGNOSIS — F0391 Unspecified dementia with behavioral disturbance: Secondary | ICD-10-CM

## 2013-02-27 DIAGNOSIS — K5792 Diverticulitis of intestine, part unspecified, without perforation or abscess without bleeding: Secondary | ICD-10-CM

## 2013-02-27 DIAGNOSIS — K7689 Other specified diseases of liver: Secondary | ICD-10-CM

## 2013-02-27 DIAGNOSIS — F03918 Unspecified dementia, unspecified severity, with other behavioral disturbance: Secondary | ICD-10-CM

## 2013-02-27 DIAGNOSIS — E039 Hypothyroidism, unspecified: Secondary | ICD-10-CM

## 2013-02-27 DIAGNOSIS — K760 Fatty (change of) liver, not elsewhere classified: Secondary | ICD-10-CM

## 2013-02-27 DIAGNOSIS — I10 Essential (primary) hypertension: Secondary | ICD-10-CM

## 2013-02-27 DIAGNOSIS — E1159 Type 2 diabetes mellitus with other circulatory complications: Secondary | ICD-10-CM

## 2013-02-27 DIAGNOSIS — E785 Hyperlipidemia, unspecified: Secondary | ICD-10-CM

## 2013-02-27 DIAGNOSIS — F341 Dysthymic disorder: Secondary | ICD-10-CM

## 2013-02-27 DIAGNOSIS — I639 Cerebral infarction, unspecified: Secondary | ICD-10-CM

## 2013-02-27 DIAGNOSIS — K5732 Diverticulitis of large intestine without perforation or abscess without bleeding: Secondary | ICD-10-CM

## 2013-02-27 DIAGNOSIS — N393 Stress incontinence (female) (male): Secondary | ICD-10-CM

## 2013-02-27 DIAGNOSIS — M797 Fibromyalgia: Secondary | ICD-10-CM

## 2013-02-27 DIAGNOSIS — E119 Type 2 diabetes mellitus without complications: Secondary | ICD-10-CM

## 2013-02-27 DIAGNOSIS — K5901 Slow transit constipation: Secondary | ICD-10-CM

## 2013-02-27 DIAGNOSIS — I6350 Cerebral infarction due to unspecified occlusion or stenosis of unspecified cerebral artery: Secondary | ICD-10-CM

## 2013-02-27 DIAGNOSIS — IMO0001 Reserved for inherently not codable concepts without codable children: Secondary | ICD-10-CM

## 2013-02-27 DIAGNOSIS — F418 Other specified anxiety disorders: Secondary | ICD-10-CM

## 2013-02-27 LAB — POCT GLYCOSYLATED HEMOGLOBIN (HGB A1C): Hemoglobin A1C: 6.4

## 2013-02-27 MED ORDER — INSULIN GLARGINE 100 UNIT/ML ~~LOC~~ SOLN: SUBCUTANEOUS | Status: DC

## 2013-02-27 MED ORDER — DICLOFENAC SODIUM 50 MG PO TBEC: 50.0000 mg | DELAYED_RELEASE_TABLET | Freq: Two times a day (BID) | ORAL | Status: AC

## 2013-02-27 MED ORDER — ALPRAZOLAM 0.5 MG PO TABS: 0.5000 mg | ORAL_TABLET | Freq: Three times a day (TID) | ORAL | Status: DC | PRN

## 2013-02-27 MED ORDER — POLYETHYLENE GLYCOL 3350 17 G PO PACK: 17.0000 g | PACK | Freq: Every day | ORAL | Status: DC

## 2013-02-27 MED ORDER — ZOLPIDEM TARTRATE 5 MG PO TABS: 5.0000 mg | ORAL_TABLET | Freq: Every evening | ORAL | Status: DC | PRN

## 2013-02-27 MED ORDER — POTASSIUM CHLORIDE CRYS ER 20 MEQ PO TBCR: 20.0000 meq | EXTENDED_RELEASE_TABLET | Freq: Every day | ORAL | Status: AC

## 2013-02-27 MED ORDER — INSULIN ASPART 100 UNIT/ML ~~LOC~~ SOLN: SUBCUTANEOUS | Status: DC

## 2013-02-27 MED ORDER — LEVOTHYROXINE SODIUM 50 MCG PO TABS: 50.0000 ug | ORAL_TABLET | Freq: Every morning | ORAL | Status: DC

## 2013-02-27 NOTE — Progress Notes (Signed)
Patient ID: Sarah Skinner, female   DOB: 08/27/1940, 73 y.o.   MRN: [id number] SUBJECTIVE: CC: Chief Complaint  Patient presents with  . Follow-up    needs refills alprazolam miralax, levothyroxine, insulin K+ ambien, diclofenac test strips and syringes.  c/o staomach hurts after she eats but family reports she is suppose to sit jup after eating and she refuses. also stataes wavy lines in eyes      HPI: Patient is here for follow up of Diabetes Mellitus: Symptoms evaluated: Denies Nocturia ,Denies Urinary Frequency , denies Blurred vision ,deniesDizziness,denies.Dysuria,denies paresthesias, denies extremity pain or ulcers.Marland Kitchendenies chest pain. has had an annual eye exam. do check the feet. Does check CBGs. Average VVO:[id number] Denies episodes of hypoglycemia. Does have an emergency hypoglycemic plan. admits toCompliance with medications. Denies Problems with medications.  Is doing better with mirtazipine. Sees Dr Casimiro Needle at [facility].  Needs all her Rx refilled.     Past Medical History  Diagnosis Date  . Hypertension   . Diabetes mellitus   . Hypothyroidism   . Blood transfusion     hx of with childbirth   . Stress incontinence     stress incontinence   . GERD (gastroesophageal reflux disease)   . Headache(784.0)     hx of   . Fibromyalgia   . Anxiety   . Arthritis     hands, feet , knees   . Stroke     left sided weakness   . Anginal pain     not currently seeing cardiologist   Past Surgical History  Procedure Laterality Date  . Abdominal hysterectomy    . Cholecystectomy    . Rectocele repair    . Cervical fusion    . Tubal ligation    . Wisdom tooth extraction    . Cystoscopy with injection  05/18/2011    Procedure: CYSTOSCOPY WITH INJECTION;  Surgeon: Malka So, MD;  Location: [facility];  Service: Urology;  Laterality: N/A;  Injection of Macroplastique  . Flexible sigmoidoscopy  08/18/2011    Procedure: FLEXIBLE SIGMOIDOSCOPY;  Surgeon: Juanita Craver, MD;  Location: [facility];  Service: Endoscopy;  Laterality: N/A;   History   Social History  . Marital Status: Married    Spouse Name: Milbert Coulter    Number of Children: 6  . Years of Education: 12   Occupational History  . Retired Freight forwarder    Social History Main Topics  . Smoking status: Former Smoker -- 2.00 packs/day for 20 years    Types: Cigarettes    Quit date: 02/23/1991  . Smokeless tobacco: Never Used     Comment: Quite 20 Yrs Ago  . Alcohol Use: No     Comment: hx of no longer uses   . Drug Use: No  . Sexual Activity: No   Other Topics Concern  . Not on file   Social History Narrative   Married.  Lives in [place] with her husband.  Wheelchair bound.  Transfers with assistance.  Non-ambulatory.   Family History  Problem Relation Age of Onset  . Heart failure Mother   . COPD Father   . Diabetes Sister   . Heart failure Sister    Current Outpatient Prescriptions on File Prior to Visit  Medication Sig Dispense Refill  . amLODipine (NORVASC) 5 MG tablet Take 5 mg by mouth every morning.      . cholecalciferol (VITAMIN D) 1000 UNITS tablet Take 1,000 Units by mouth every morning.       Marland Kitchen  docusate sodium (COLACE) 100 MG capsule Take 200 mg by mouth 2 (two) times daily.      . Lactobacillus Rhamnosus, GG, (CULTURELLE PO) Take 1 tablet by mouth daily.      Marland Kitchen loratadine (CLARITIN) 10 MG tablet Take 10 mg by mouth daily.       . metoprolol (LOPRESSOR) 50 MG tablet Take 1 tablet (50 mg total) by mouth 2 (two) times daily.  60 tablet  0  . mirabegron ER (MYRBETRIQ) 25 MG TB24 Take 25 mg by mouth daily.      . mirtazapine (REMERON) 30 MG tablet Take 30 mg by mouth at bedtime.      . Multiple Vitamins-Minerals (MULTIVITAMIN & MINERAL PO) Take 1 tablet by mouth daily with lunch. Multivitamin with calcium 1021m, magnesium 4068mand zinc 15 mg per daughter.      . Omega-3 Fatty Acids (FISH OIL) 1000 MG CAPS Take 1 capsule by mouth 4 (four) times daily.       .  pantoprazole (PROTONIX) 40 MG tablet Take 40 mg by mouth daily.      . pantoprazole (PROTONIX) 40 MG tablet TAKE ONE TABLET BY MOUTH ONCE DAILY  30 tablet  3  . rosuvastatin (CRESTOR) 10 MG tablet Take 10 mg by mouth every other day. At bedtime      . [DISCONTINUED] fexofenadine (ALLEGRA) 180 MG tablet Take 180 mg by mouth every morning.        No current facility-administered medications on file prior to visit.   Allergies  Allergen Reactions  . Aspirin     Red splotches all over body   . Penicillins Hives    Red splotches all over body  . Sulfa Antibiotics Hives    Red splotches all over body  . Latex Rash   There is no immunization history for the selected administration types on file for this patient. Prior to Admission medications   Medication Sig Start Date End Date Taking? Authorizing Provider  ALPRAZolam (XDuanne Moron0.5 MG tablet Take 1 tablet (0.5 mg total) by mouth 3 (three) times daily as needed for anxiety. 02/27/13  Yes FrVernie ShanksMD  amLODipine (NORVASC) 5 MG tablet Take 5 mg by mouth every morning.   Yes Historical Provider, MD  cholecalciferol (VITAMIN D) 1000 UNITS tablet Take 1,000 Units by mouth every morning.    Yes Historical Provider, MD  diclofenac (VOLTAREN) 50 MG EC tablet Take 1 tablet (50 mg total) by mouth 2 (two) times daily. 02/27/13  Yes FrVernie ShanksMD  docusate sodium (COLACE) 100 MG capsule Take 200 mg by mouth 2 (two) times daily. 08/20/11  Yes AlRobbie LisMD  Gabapentin, PHN, (GRALISE) 600 MG TABS Take by mouth.   Yes Historical Provider, MD  insulin aspart (NOVOLOG) 100 UNIT/ML injection INJECT [facility] 02/27/13  Yes FrVernie ShanksMD  insulin glargine (LANTUS) 100 UNIT/ML injection INJECT 31 UNITS AT BEDTIME 02/27/13  Yes FrVernie ShanksMD  Lactobacillus Rhamnosus, GG, (CULTURELLE PO) Take 1 tablet by mouth daily.   Yes Historical Provider, MD  levothyroxine (SYNTHROID, LEVOTHROID) 50 MCG tablet Take 1 tablet (50 mcg total) by mouth  AC breakfast. 02/27/13  Yes FrVernie ShanksMD  loratadine (CLARITIN) 10 MG tablet Take 10 mg by mouth daily.    Yes Historical Provider, MD  metoprolol (LOPRESSOR) 50 MG tablet Take 1 tablet (50 mg total) by mouth 2 (two) times daily. 02/13/13  Yes FrVernie ShanksMD  mirabegron ER (MYRBETRIQ) 25 MG TB24 Take 25 mg by mouth daily.   Yes Historical Provider, MD  mirtazapine (REMERON) 30 MG tablet Take 30 mg by mouth at bedtime.   Yes Historical Provider, MD  Multiple Vitamins-Minerals (MULTIVITAMIN & MINERAL PO) Take 1 tablet by mouth daily with lunch. Multivitamin with calcium 1056m, magnesium 4063mand zinc 15 mg per daughter.   Yes Historical Provider, MD  Omega-3 Fatty Acids (FISH OIL) 1000 MG CAPS Take 1 capsule by mouth 4 (four) times daily.    Yes Historical Provider, MD  pantoprazole (PROTONIX) 40 MG tablet Take 40 mg by mouth daily.   Yes Historical Provider, MD  pantoprazole (PROTONIX) 40 MG tablet TAKE ONE TABLET BY MOUTH ONCE DAILY 01/23/13  Yes FrVernie ShanksMD  polyethylene glycol (MIRALAX / GLYCOLAX) packet Take 17 g by mouth daily. 02/27/13  Yes FrVernie ShanksMD  potassium chloride SA (K-DUR,KLOR-CON) 20 MEQ tablet Take 1 tablet (20 mEq total) by mouth daily. 02/27/13  Yes FrVernie ShanksMD  rosuvastatin (CRESTOR) 10 MG tablet Take 10 mg by mouth every other day. At bedtime   Yes Historical Provider, MD  zolpidem (AMBIEN) 5 MG tablet Take 1 tablet (5 mg total) by mouth at bedtime as needed for sleep. 02/27/13  Yes FrVernie ShanksMD     ROS: As above in the HPI. All other systems are stable or negative.  OBJECTIVE: APPEARANCE:  Patient in no acute distress.The patient appeared well nourished and normally developed. Acyanotic. Waist: VITAL SIGNS:BP 152/71  Pulse 72  Temp(Src) 97.8 F (36.6 C) (Oral)  Wt 190 lb (86.183 kg) Obese WF calm in a wheel chair.   SKIN: warm and  Dry without overt rashes, tattoos and scars  HEAD and Neck: without JVD, Head and scalp:  normal Eyes:No scleral icterus. Fundi normal, eye movements normal. Ears: Auricle normal, canal normal, Tympanic membranes normal, insufflation normal. Nose: normal Throat: normal Neck & thyroid: normal  CHEST & LUNGS: Chest wall: normal Lungs: Clear  CVS: Reveals the PMI to be normally located. Regular rhythm, First and Second Heart sounds are normal,  absence of murmurs, rubs or gallops. Peripheral vasculature: Radial pulses: normal Dorsal pedis pulses: normal Posterior pulses: normal  ABDOMEN:  Appearance: Obese Benign, no organomegaly, no masses, no Abdominal Aortic enlargement. No Guarding , no rebound. No Bruits. Bowel sounds: normal  RECTAL: N/A GU: N/A  EXTREMETIES: trace edematous.   NEUROLOGIC: oriented to time,place and person; left hemi-plegia.no change.  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 However, it is a challenge to  care for this patient at home but family doing a great job.  PLAN:      Dr Paula Libra Recommendations  For nutrition information, I recommend books:  1).Eat to Live by Dr Excell Seltzer. 2).Prevent and Reverse Heart Disease by Dr Karl Luke. 3) Dr Janene Harvey Book:  Program to Reverse Diabetes  Exercise recommendations are:  If unable to walk, then the patient can exercise in a chair 3 times a day. By flapping arms like a bird gently and raising legs outwards to the front.  If ambulatory, the patient can go for walks for 30 minutes 3 times a week. Then increase the intensity and duration as tolerated.  Goal is to try to attain exercise frequency to 5 times a week.  If applicable: Best to perform resistance exercises (machines or weights) 2 days a week and cardio type exercises 3 days per week.  Orders Placed This Encounter  Procedures  . CMP14+EGFR  . NMR, lipoprofile  . TSH  . POCT glycosylated hemoglobin (Hb A1C)   Meds ordered this encounter  Medications  . Gabapentin, PHN, (GRALISE) 600 MG TABS    Sig: Take by mouth.  . ALPRAZolam (XANAX) 0.5 MG tablet    Sig: Take 1 tablet (0.5 mg total) by mouth 3 (three) times daily as needed for anxiety.    Dispense:  90 tablet    Refill:  2  . diclofenac (VOLTAREN) 50 MG EC tablet    Sig: Take 1 tablet (50 mg total) by mouth 2 (two) times daily.    Dispense:  60 tablet    Refill:  2  . insulin glargine (LANTUS) 100 UNIT/ML injection    Sig: INJECT 31 UNITS AT BEDTIME    Dispense:  1 vial    Refill:  11  . levothyroxine (SYNTHROID, LEVOTHROID) 50 MCG tablet    Sig: Take 1 tablet (50 mcg total) by mouth AC breakfast.    Dispense:  30 tablet    Refill:  11  . insulin aspart (NOVOLOG) 100 UNIT/ML injection    Sig: INJECT 3 UNITS BEFORE EACH MEAL    Dispense:  1 vial    Refill:  11  . polyethylene glycol (MIRALAX / GLYCOLAX) packet    Sig: Take 17 g by mouth daily.    Dispense:  14 each    Refill:  2  .  potassium chloride SA (K-DUR,KLOR-CON) 20 MEQ tablet    Sig: Take 1 tablet (20 mEq total) by mouth daily.    Dispense:  30 tablet    Refill:  11  . zolpidem (AMBIEN) 5 MG tablet    Sig: Take 1 tablet (5 mg total) by mouth at bedtime as needed for sleep.    Dispense:  30 tablet    Refill:  2   Medications Discontinued During This Encounter  Medication Reason  . ALPRAZolam (XANAX) 0.5 MG tablet Reorder  . diclofenac (VOLTAREN) 50 MG EC tablet Reorder  . LANTUS 100 UNIT/ML injection Reorder  . levothyroxine (SYNTHROID, LEVOTHROID) 50 MCG tablet Reorder  . NOVOLOG 100 UNIT/ML injection Reorder  . polyethylene glycol (MIRALAX / GLYCOLAX) packet Reorder  . potassium chloride SA (K-DUR,KLOR-CON) 20 MEQ tablet Reorder  . zolpidem (AMBIEN) 5 MG tablet Reorder  Rx for test strips and lancets # 400 [id number]    Return in about 3 months (around 05/28/2013) for Recheck medical problems.  Jaylianna Tatlock P. Jacelyn Grip, M.D.

## 2013-02-27 NOTE — Patient Instructions (Signed)
      Dr Damyah Gugel's Recommendations  For nutrition information, I recommend books:  1).Eat to Live by Dr Joel Fuhrman. 2).Prevent and Reverse Heart Disease by Dr Caldwell Esselstyn. 3) Dr Neal Barnard's Book:  Program to Reverse Diabetes  Exercise recommendations are:  If unable to walk, then the patient can exercise in a chair 3 times a day. By flapping arms like a bird gently and raising legs outwards to the front.  If ambulatory, the patient can go for walks for 30 minutes 3 times a week. Then increase the intensity and duration as tolerated.  Goal is to try to attain exercise frequency to 5 times a week.  If applicable: Best to perform resistance exercises (machines or weights) 2 days a week and cardio type exercises 3 days per week.  

## 2013-02-28 ENCOUNTER — Other Ambulatory Visit: Payer: Self-pay | Admitting: Family Medicine

## 2013-02-28 DIAGNOSIS — R748 Abnormal levels of other serum enzymes: Secondary | ICD-10-CM

## 2013-02-28 LAB — NMR, LIPOPROFILE
Cholesterol: 155 mg/dL (ref <200)
HDL Cholesterol by NMR: 41 mg/dL (ref >=40)
HDL Particle Number: 33.4 umol/L (ref >=30.5)
LDL Particle Number: 1222 nmol/L — ABNORMAL HIGH (ref <1000)
LDL Size: 21 nm (ref >20.5)
LDLC SERPL CALC-MCNC: 65 mg/dL (ref <100)
LP-IR Score: 50 — ABNORMAL HIGH (ref <=45)
Small LDL Particle Number: 762 nmol/L — ABNORMAL HIGH (ref <=527)
Triglycerides by NMR: 246 mg/dL — ABNORMAL HIGH (ref <150)

## 2013-02-28 LAB — CMP14+EGFR
ALT: 71 IU/L — ABNORMAL HIGH (ref 0–32)
AST: 41 IU/L — ABNORMAL HIGH (ref 0–40)
Albumin/Globulin Ratio: 2.4 (ref 1.1–2.5)
Albumin: 4.4 g/dL (ref 3.5–4.8)
Alkaline Phosphatase: 92 IU/L (ref 39–117)
BUN/Creatinine Ratio: 14 (ref 11–26)
BUN: 11 mg/dL (ref 8–27)
CO2: 27 mmol/L (ref 18–29)
Calcium: 9.9 mg/dL (ref 8.6–10.2)
Chloride: 96 mmol/L — ABNORMAL LOW (ref 97–108)
Creatinine, Ser: 0.8 mg/dL (ref 0.57–1.00)
GFR calc Af Amer: 86 mL/min/{1.73_m2} (ref >59)
GFR calc non Af Amer: 74 mL/min/{1.73_m2} (ref >59)
Globulin, Total: 1.8 g/dL (ref 1.5–4.5)
Glucose: 162 mg/dL — ABNORMAL HIGH (ref 65–99)
Potassium: 4.1 mmol/L (ref 3.5–5.2)
Sodium: 142 mmol/L (ref 134–144)
Total Bilirubin: 0.6 mg/dL (ref 0.0–1.2)
Total Protein: 6.2 g/dL (ref 6.0–8.5)

## 2013-02-28 LAB — TSH: TSH: 2.09 u[IU]/mL (ref 0.450–4.500)

## 2013-03-01 ENCOUNTER — Other Ambulatory Visit: Payer: Self-pay | Admitting: Family Medicine

## 2013-03-02 ENCOUNTER — Telehealth: Payer: Self-pay | Admitting: Family Medicine

## 2013-03-02 NOTE — Telephone Encounter (Signed)
SPOKE TO Sarah Skinner LAB RESULTS GIVEN AGAIN AND VERBALIZED UNDERSTANDING

## 2013-03-05 ENCOUNTER — Other Ambulatory Visit: Payer: Self-pay | Admitting: Pharmacist

## 2013-03-05 MED ORDER — ONETOUCH ULTRA SYSTEM W/DEVICE KIT: PACK | Status: DC

## 2013-03-05 MED ORDER — ONETOUCH DELICA LANCETS 33G MISC: Status: DC

## 2013-03-05 MED ORDER — GLUCOSE BLOOD VI STRP: ORAL_STRIP | Status: DC

## 2013-03-05 NOTE — Telephone Encounter (Signed)
Insurance no longer preferrs patient's current glucometer - freestyle lite.  Preferred is One Touch ULtra or any Accu-Check meter.  Patient called and she would like the One Touch ultra.  Rx for glucometer, test strips and lancet printed.  Will have provider sign and fax to [facility].

## 2013-03-16 ENCOUNTER — Other Ambulatory Visit: Payer: Self-pay | Admitting: Physician Assistant

## 2013-03-16 ENCOUNTER — Ambulatory Visit
Admission: RE | Admit: 2013-03-16 | Discharge: 2013-03-16 | Disposition: A | Discharge status: Home / Self Care | Payer: Medicare Other | Source: Ambulatory Visit | Attending: Physician Assistant | Admitting: Physician Assistant

## 2013-03-16 DIAGNOSIS — M549 Dorsalgia, unspecified: Secondary | ICD-10-CM

## 2013-03-16 DIAGNOSIS — M25561 Pain in right knee: Secondary | ICD-10-CM

## 2013-03-23 ENCOUNTER — Ambulatory Visit (INDEPENDENT_AMBULATORY_CARE_PROVIDER_SITE_OTHER): Payer: 59 | Admitting: Psychiatry

## 2013-03-23 DIAGNOSIS — F332 Major depressive disorder, recurrent severe without psychotic features: Secondary | ICD-10-CM

## 2013-03-23 NOTE — Progress Notes (Signed)
[facility] MD Progress Note  03/23/2013 11:47 AM Sarah Skinner  MRN:  [id number] Subjective: Feels good Today the patient is seen with both her daughters. Overall the patient is better. She is in less pain. She seen a specialist for this condition. She does not appear depressed. She is generally sleeping and eating well. She is taking Remeron as prescribed. In the next 24 hours however she's going to be admitted to a nursing home. This still process and the family discussion that she could not be taken care of adequately at home with the patient's husband. They tell her that we just temporary but we shall see. Overall the patient is not anxious. She demonstrates no psychosis. She is not agitated. Generally she acknowledges that she is better and more optimistic. Diagnosis:   DSM5: Schizophrenia Disorders:   Obsessive-Compulsive Disorders:   Trauma-Stressor Disorders:   Substance/Addictive Disorders:   Depressive Disorders:   Total Time spent with patient:   Axis I: Major Depression, Recurrent severe  ADL's:  Intact  Sleep: Good  Appetite:  Good  Suicidal Ideation:  noHomicidal Ideation: no  AEB (as evidenced by):  Psychiatric Specialty Exam: Physical Exam  ROS  There were no vitals taken for this visit.There is no weight on file to calculate BMI.  General Appearance:   Eye Contact::  Good  Speech:  Normal Rate  Volume:  Normal  Mood:  Euthymic  Affect:  Appropriate  Thought Process:  Circumstantial and Coherent  Orientation:  Full (Time, Place, and Person)  Thought Content:  WDL  Suicidal Thoughts:  No  Homicidal Thoughts:  No  Memory:    Judgement:  Fair  Insight:  Fair  Psychomotor Activity:  Decreased  Concentration:  Fair  Recall:  [place] of Knowledge:Good  Language: Fair  Akathisia:  No  Handed:  Right  AIMS (if indicated):     Assets:  Desire for Improvement  Sleep:      Musculoskeletal: Strength & Muscle Tone:  Gait & Station:  Patient leans:    Current Medications: Current Outpatient Prescriptions  Medication Sig Dispense Refill  . ALPRAZolam (XANAX) 0.5 MG tablet Take 1 tablet (0.5 mg total) by mouth 3 (three) times daily as needed for anxiety.  90 tablet  2  . amLODipine (NORVASC) 5 MG tablet Take 5 mg by mouth every morning.      . Blood Glucose Monitoring Suppl (ONE TOUCH ULTRA SYSTEM KIT) W/DEVICE KIT Use to check BG twice a day.  Dx:  250.03  1 each  0  . cholecalciferol (VITAMIN D) 1000 UNITS tablet Take 1,000 Units by mouth every morning.       . diclofenac (VOLTAREN) 50 MG EC tablet Take 1 tablet (50 mg total) by mouth 2 (two) times daily.  60 tablet  2  . docusate sodium (COLACE) 100 MG capsule Take 200 mg by mouth 2 (two) times daily.      . Gabapentin, PHN, (GRALISE) 600 MG TABS Take by mouth.      Marland Kitchen glucose blood test strip Use to check BG twice daily.  Dx:   250.03  200 each  1  . insulin aspart (NOVOLOG) 100 UNIT/ML injection INJECT 3 UNITS BEFORE EACH MEAL  1 vial  11  . insulin glargine (LANTUS) 100 UNIT/ML injection INJECT 31 UNITS AT BEDTIME  1 vial  11  . Lactobacillus Rhamnosus, GG, (CULTURELLE PO) Take 1 tablet by mouth daily.      Marland Kitchen levothyroxine (SYNTHROID, LEVOTHROID) 50 MCG tablet  Take 1 tablet (50 mcg total) by mouth AC breakfast.  30 tablet  11  . loratadine (CLARITIN) 10 MG tablet Take 10 mg by mouth daily.       . metoprolol (LOPRESSOR) 50 MG tablet Take 1 tablet (50 mg total) by mouth 2 (two) times daily.  60 tablet  0  . mirabegron ER (MYRBETRIQ) 25 MG TB24 Take 25 mg by mouth daily.      . mirtazapine (REMERON) 30 MG tablet Take 30 mg by mouth at bedtime.      . Multiple Vitamins-Minerals (MULTIVITAMIN & MINERAL PO) Take 1 tablet by mouth daily with lunch. Multivitamin with calcium 1036m, magnesium 4032mand zinc 15 mg per daughter.      . Omega-3 Fatty Acids (FISH OIL) 1000 MG CAPS Take 1 capsule by mouth 4 (four) times daily.       . Glory RosebushELICA LANCETS [id number] Use to check BG twice a day.   Dx:  250.03  200 each  1  . pantoprazole (PROTONIX) 40 MG tablet Take 40 mg by mouth daily.      . pantoprazole (PROTONIX) 40 MG tablet TAKE ONE TABLET BY MOUTH ONCE DAILY  30 tablet  3  . polyethylene glycol (MIRALAX / GLYCOLAX) packet Take 17 g by mouth daily.  14 each  2  . potassium chloride SA (K-DUR,KLOR-CON) 20 MEQ tablet Take 1 tablet (20 mEq total) by mouth daily.  30 tablet  11  . rosuvastatin (CRESTOR) 10 MG tablet Take 10 mg by mouth every other day. At bedtime      . zolpidem (AMBIEN) 5 MG tablet Take 1 tablet (5 mg total) by mouth at bedtime as needed for sleep.  30 tablet  2  . [DISCONTINUED] fexofenadine (ALLEGRA) 180 MG tablet Take 180 mg by mouth every morning.        No current facility-administered medications for this visit.    Lab Results: No results found for this or any previous visit (from the past 48 hour(s)).  Physical Findings: AIMS:  , ,  ,  ,    CIWA:    COWS:     Treatment Plan Summary: Medication management  Plan: At this time the patient will continue taking Remeron 30 mg. She also is on pain medicine from a pain specialist which seems to be very helpful. [place]. She begin a return appointment in 4 months.  Medical Decision Making Problem Points:  Established problem, stable/improving (1) Data Points:  Review of medication regiment & side effects (2)  I certify that inpatient services furnished can reasonably be expected to improve the patient's condition.   Leatrice Parilla, [place]/30/2015, 11:47 AM

## 2013-04-03 ENCOUNTER — Encounter: Payer: Self-pay | Admitting: *Deleted

## 2013-04-16 ENCOUNTER — Ambulatory Visit: Payer: Medicare Other | Admitting: Family Medicine

## 2013-04-17 ENCOUNTER — Ambulatory Visit: Payer: Medicare Other | Admitting: Family Medicine

## 2013-04-21 DIAGNOSIS — M069 Rheumatoid arthritis, unspecified: Secondary | ICD-10-CM

## 2013-04-21 DIAGNOSIS — E119 Type 2 diabetes mellitus without complications: Secondary | ICD-10-CM

## 2013-04-21 DIAGNOSIS — I10 Essential (primary) hypertension: Secondary | ICD-10-CM

## 2013-04-21 DIAGNOSIS — I69939 Monoplegia of upper limb following unspecified cerebrovascular disease affecting unspecified side: Secondary | ICD-10-CM

## 2013-05-18 ENCOUNTER — Ambulatory Visit (INDEPENDENT_AMBULATORY_CARE_PROVIDER_SITE_OTHER): Payer: Medicare Other | Admitting: Urology

## 2013-05-18 DIAGNOSIS — N3946 Mixed incontinence: Secondary | ICD-10-CM

## 2013-05-18 DIAGNOSIS — Z87440 Personal history of urinary (tract) infections: Secondary | ICD-10-CM

## 2013-06-04 ENCOUNTER — Ambulatory Visit: Payer: Medicaid Other | Admitting: Family Medicine

## 2013-06-12 ENCOUNTER — Ambulatory Visit: Payer: Medicaid Other | Admitting: Family Medicine

## 2013-08-15 ENCOUNTER — Ambulatory Visit (HOSPITAL_COMMUNITY): Payer: Self-pay | Admitting: Psychiatry

## 2013-09-06 ENCOUNTER — Ambulatory Visit: Payer: Medicare Other | Admitting: Family Medicine

## 2015-03-19 ENCOUNTER — Encounter: Payer: Self-pay | Admitting: Gastroenterology

## 2015-11-16 ENCOUNTER — Emergency Department (HOSPITAL_COMMUNITY): Payer: Medicare Other

## 2015-11-16 ENCOUNTER — Encounter (HOSPITAL_COMMUNITY): Payer: Self-pay | Admitting: *Deleted

## 2015-11-16 ENCOUNTER — Observation Stay (HOSPITAL_COMMUNITY)
Admission: EM | Admit: 2015-11-16 | Discharge: 2015-11-17 | Disposition: A | Discharge status: Home / Self Care | Payer: Medicare Other | Attending: Internal Medicine | Admitting: Internal Medicine

## 2015-11-16 DIAGNOSIS — Z87891 Personal history of nicotine dependence: Secondary | ICD-10-CM | POA: Diagnosis not present

## 2015-11-16 DIAGNOSIS — R1311 Dysphagia, oral phase: Secondary | ICD-10-CM

## 2015-11-16 DIAGNOSIS — Z79899 Other long term (current) drug therapy: Secondary | ICD-10-CM | POA: Insufficient documentation

## 2015-11-16 DIAGNOSIS — I10 Essential (primary) hypertension: Secondary | ICD-10-CM | POA: Insufficient documentation

## 2015-11-16 DIAGNOSIS — R4701 Aphasia: Secondary | ICD-10-CM | POA: Diagnosis present

## 2015-11-16 DIAGNOSIS — R4781 Slurred speech: Principal | ICD-10-CM | POA: Insufficient documentation

## 2015-11-16 DIAGNOSIS — E119 Type 2 diabetes mellitus without complications: Secondary | ICD-10-CM | POA: Diagnosis not present

## 2015-11-16 DIAGNOSIS — G2401 Drug induced subacute dyskinesia: Secondary | ICD-10-CM | POA: Diagnosis not present

## 2015-11-16 DIAGNOSIS — R29898 Other symptoms and signs involving the musculoskeletal system: Secondary | ICD-10-CM

## 2015-11-16 DIAGNOSIS — G459 Transient cerebral ischemic attack, unspecified: Secondary | ICD-10-CM | POA: Diagnosis not present

## 2015-11-16 DIAGNOSIS — E039 Hypothyroidism, unspecified: Secondary | ICD-10-CM | POA: Diagnosis not present

## 2015-11-16 DIAGNOSIS — F0390 Unspecified dementia without behavioral disturbance: Secondary | ICD-10-CM | POA: Insufficient documentation

## 2015-11-16 DIAGNOSIS — N390 Urinary tract infection, site not specified: Secondary | ICD-10-CM | POA: Diagnosis not present

## 2015-11-16 DIAGNOSIS — R7989 Other specified abnormal findings of blood chemistry: Secondary | ICD-10-CM | POA: Insufficient documentation

## 2015-11-16 DIAGNOSIS — R778 Other specified abnormalities of plasma proteins: Secondary | ICD-10-CM

## 2015-11-16 DIAGNOSIS — Z794 Long term (current) use of insulin: Secondary | ICD-10-CM | POA: Diagnosis not present

## 2015-11-16 HISTORY — DX: Unspecified dementia, unspecified severity, without behavioral disturbance, psychotic disturbance, mood disturbance, and anxiety: F03.90

## 2015-11-16 LAB — BASIC METABOLIC PANEL
ANION GAP: 7 (ref 5–15)
BUN: 10 mg/dL (ref 6–20)
CALCIUM: 9.2 mg/dL (ref 8.9–10.3)
CO2: 27 mmol/L (ref 22–32)
Chloride: 98 mmol/L — ABNORMAL LOW (ref 101–111)
Creatinine, Ser: 0.61 mg/dL (ref 0.44–1.00)
GFR calc Af Amer: >60 mL/min (ref >60)
Glucose, Bld: 128 mg/dL — ABNORMAL HIGH (ref 65–99)
POTASSIUM: 3.5 mmol/L (ref 3.5–5.1)
SODIUM: 132 mmol/L — AB (ref 135–145)

## 2015-11-16 LAB — CBC WITH DIFFERENTIAL/PLATELET
BASOS ABS: 0 10*3/uL (ref 0.0–0.1)
Basophils Relative: 0 %
EOS PCT: 2 %
Eosinophils Absolute: 0.2 10*3/uL (ref 0.0–0.7)
HCT: 39.7 % (ref 36.0–46.0)
Hemoglobin: 13.5 g/dL (ref 12.0–15.0)
Lymphocytes Relative: 29 %
Lymphs Abs: 3 10*3/uL (ref 0.7–4.0)
MCH: 30.4 pg (ref 26.0–34.0)
MCHC: 34 g/dL (ref 30.0–36.0)
MCV: 89.4 fL (ref 78.0–100.0)
MONO ABS: 1 10*3/uL (ref 0.1–1.0)
Monocytes Relative: 10 %
Neutro Abs: 6.1 10*3/uL (ref 1.7–7.7)
Neutrophils Relative %: 59 %
PLATELETS: 243 10*3/uL (ref 150–400)
RBC: 4.44 MIL/uL (ref 3.87–5.11)
RDW: 12.8 % (ref 11.5–15.5)
WBC: 10.3 10*3/uL (ref 4.0–10.5)

## 2015-11-16 LAB — URINE MICROSCOPIC-ADD ON

## 2015-11-16 LAB — URINALYSIS, ROUTINE W REFLEX MICROSCOPIC
BILIRUBIN URINE: NEGATIVE
GLUCOSE, UA: NEGATIVE mg/dL
Hgb urine dipstick: NEGATIVE
KETONES UR: NEGATIVE mg/dL
NITRITE: POSITIVE — AB
PH: 7 (ref 5.0–8.0)
PROTEIN: NEGATIVE mg/dL
Specific Gravity, Urine: 1.01 (ref 1.005–1.030)

## 2015-11-16 LAB — GLUCOSE, CAPILLARY: GLUCOSE-CAPILLARY: 140 mg/dL — AB (ref 65–99)

## 2015-11-16 LAB — TROPONIN I: TROPONIN I: 0.03 ng/mL — AB (ref <0.03)

## 2015-11-16 MED ORDER — LEVOFLOXACIN IN D5W 500 MG/100ML IV SOLN
500.0000 mg | INTRAVENOUS | Status: DC
  Administered 2015-11-16: 500 mg via INTRAVENOUS
  Filled 2015-11-16: qty 100

## 2015-11-16 MED ORDER — OMEGA-3-ACID ETHYL ESTERS 1 G PO CAPS: 1.0000 g | ORAL_CAPSULE | Freq: Two times a day (BID) | ORAL | Status: DC

## 2015-11-16 MED ORDER — INSULIN GLARGINE 100 UNIT/ML ~~LOC~~ SOLN
30.0000 [IU] | Freq: Every day | SUBCUTANEOUS | Status: DC
  Filled 2015-11-16 (×2): qty 0.3

## 2015-11-16 MED ORDER — PANTOPRAZOLE SODIUM 40 MG PO TBEC: 40.0000 mg | DELAYED_RELEASE_TABLET | Freq: Every day | ORAL | Status: DC

## 2015-11-16 MED ORDER — INSULIN ASPART 100 UNIT/ML ~~LOC~~ SOLN
0.0000 [IU] | Freq: Three times a day (TID) | SUBCUTANEOUS | Status: DC
  Administered 2015-11-17 (×2): 1 [IU] via SUBCUTANEOUS

## 2015-11-16 MED ORDER — ALPRAZOLAM 0.5 MG PO TABS: 0.5000 mg | ORAL_TABLET | Freq: Three times a day (TID) | ORAL | Status: DC | PRN

## 2015-11-16 MED ORDER — SODIUM CHLORIDE 0.9 % IV SOLN
INTRAVENOUS | Status: DC
  Administered 2015-11-16: via INTRAVENOUS

## 2015-11-16 MED ORDER — SENNOSIDES-DOCUSATE SODIUM 8.6-50 MG PO TABS: 1.0000 | ORAL_TABLET | Freq: Every evening | ORAL | Status: DC | PRN

## 2015-11-16 MED ORDER — LEVOTHYROXINE SODIUM 50 MCG PO TABS
50.0000 ug | ORAL_TABLET | Freq: Every day | ORAL | Status: DC
  Filled 2015-11-16: qty 1

## 2015-11-16 MED ORDER — SODIUM CHLORIDE 0.9 % IV SOLN: INTRAVENOUS | Status: DC

## 2015-11-16 MED ORDER — ENOXAPARIN SODIUM 40 MG/0.4ML ~~LOC~~ SOLN
40.0000 mg | SUBCUTANEOUS | Status: DC
  Administered 2015-11-17: 40 mg via SUBCUTANEOUS
  Filled 2015-11-16: qty 0.4

## 2015-11-16 MED ORDER — STROKE: EARLY STAGES OF RECOVERY BOOK
Freq: Once | Status: AC
Start: 2015-11-16 — End: 2015-11-17
  Administered 2015-11-17: 09:00:00
  Filled 2015-11-16: qty 1

## 2015-11-16 MED ORDER — ROSUVASTATIN CALCIUM 10 MG PO TABS: 10.0000 mg | ORAL_TABLET | ORAL | Status: DC

## 2015-11-16 NOTE — ED Notes (Signed)
Report called to [facility] on Dept 300, all questions answered.

## 2015-11-16 NOTE — ED Provider Notes (Signed)
[facility] DEPT Provider Note   CSN: [id number] Arrival date & time: 11/16/15  1905     History   Chief Complaint Chief Complaint  Patient presents with  . Hypertension  . Aphasia    HPI Sarah Skinner is a 75 y.o. female.  The history is provided by the patient, the EMS personnel, the nursing home and a relative. The history is limited by the condition of the patient (Hx Dementia).  Hypertension     Pt was seen at 1930. Per EMS, NH report: c/o gradual onset and persistence of constant "slurred speech" that began at 1045 this morning. Has been associated with high blood pressure. Pt was given clonidine at 1641 with improvement of her BP. Pt's family is concerned pt "had another stroke." Family is also requesting evaluation of pt's "cough" x1 week. Pt herself has significant hx of dementia.    Past Medical History:  Diagnosis Date  . Anginal pain ([place])    not currently seeing cardiologist  . Anxiety   . Arthritis    hands, feet , knees   . Blood transfusion    hx of with childbirth   . Dementia   . Diabetes mellitus   . Fibromyalgia   . GERD (gastroesophageal reflux disease)   . Headache(784.0)    hx of   . Hypertension   . Hypothyroidism   . Stress incontinence    stress incontinence   . Stroke [facility])    left sided weakness     Patient Active Problem List   Diagnosis Date Noted  . Major depressive disorder, recurrent episode, moderate ([place]) 01/10/2013  . Type II or unspecified type diabetes mellitus with peripheral circulatory disorders, uncontrolled(250.72) 11/16/2012  . Mood disorder in conditions classified elsewhere 11/08/2012  . HLD (hyperlipidemia) 10/02/2012  . Dementia with behavioral disturbance 05/23/2012  . Aspiration of foreign body in respiratory tract 01/27/2012  . DM2 (diabetes mellitus, type 2) ([place]) 01/27/2012  . HTN (hypertension) 01/27/2012  . CVA (cerebral infarction) 01/27/2012  . Hypokalemia 08/17/2011  . Fatty liver with  probable cirrhosis by CT scan 08/17/2011  . Chronic constipation 08/17/2011  . Proctitis with rectal bleeding, nausea and vomiting 08/17/2011  . Leukocytosis 08/17/2011  . Hyponatremia 08/17/2011  . Hypothyroidism 08/17/2011  . GERD (gastroesophageal reflux disease) 08/17/2011  . Stress incontinence   . DM (diabetes mellitus) ([place]) 05/11/2010  . Hypertension 05/11/2010  . Depression with anxiety 05/11/2010  . Fibromyalgia 05/11/2010  . Diverticulitis 05/11/2010  . 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 ABDOMINAL HYSTERECTOMY    . CERVICAL FUSION    . CHOLECYSTECTOMY    . CYSTOSCOPY WITH INJECTION  05/18/2011   Procedure: CYSTOSCOPY WITH INJECTION;  Surgeon: Malka So, MD;  Location: [facility] ORS;  Service: Urology;  Laterality: N/A;  Injection of Macroplastique  . FLEXIBLE SIGMOIDOSCOPY  08/18/2011   Procedure: FLEXIBLE SIGMOIDOSCOPY;  Surgeon: Juanita Craver, MD;  Location: [facility] ENDOSCOPY;  Service: Endoscopy;  Laterality: N/A;  . RECTOCELE REPAIR    . TUBAL LIGATION    . WISDOM TOOTH EXTRACTION         Home Medications    Prior to Admission medications   Medication Sig Start Date End Date Taking? Authorizing Provider  ALPRAZolam Duanne Moron) 0.5 MG tablet Take 1 tablet (0.5 mg total) by mouth 3 (three) times daily as needed for anxiety. 02/27/13   Vernie Shanks, MD  amLODipine (NORVASC) 5  MG tablet Take 5 mg by mouth every morning.    Historical Provider, MD  Blood Glucose Monitoring Suppl (ONE TOUCH ULTRA SYSTEM KIT) W/DEVICE KIT Use to check BG twice a day.  Dx:  250.03 03/05/13   Vernie Shanks, MD  cholecalciferol (VITAMIN D) 1000 UNITS tablet Take 1,000 Units by mouth every morning.     Historical Provider, MD  diclofenac (VOLTAREN) 50 MG EC tablet Take 1 tablet (50 mg total) by mouth 2 (two) times daily. 02/27/13   Vernie Shanks, MD  docusate sodium (COLACE) 100 MG capsule Take 200 mg by  mouth 2 (two) times daily. 08/20/11   Robbie Lis, MD  Gabapentin, PHN, (GRALISE) 600 MG TABS Take by mouth.    Historical Provider, MD  glucose blood test strip Use to check BG twice daily.  Dx:   250.03 03/05/13   Vernie Shanks, MD  insulin aspart (NOVOLOG) 100 UNIT/ML injection INJECT 3 UNITS BEFORE [facility] MEAL 02/27/13   Vernie Shanks, MD  insulin glargine (LANTUS) 100 UNIT/ML injection INJECT 31 UNITS AT BEDTIME 02/27/13   Vernie Shanks, MD  Lactobacillus Rhamnosus, GG, (CULTURELLE PO) Take 1 tablet by mouth daily.    Historical Provider, MD  levothyroxine (SYNTHROID, LEVOTHROID) 50 MCG tablet Take 1 tablet (50 mcg total) by mouth AC breakfast. 02/27/13   Vernie Shanks, MD  loratadine (CLARITIN) 10 MG tablet Take 10 mg by mouth daily.     Historical Provider, MD  metoprolol (LOPRESSOR) 50 MG tablet Take 1 tablet (50 mg total) by mouth 2 (two) times daily. 02/13/13   Vernie Shanks, MD  mirabegron ER (MYRBETRIQ) 25 MG TB24 Take 25 mg by mouth daily.    Historical Provider, MD  mirtazapine (REMERON) 30 MG tablet Take 30 mg by mouth at bedtime.    Historical Provider, MD  Multiple Vitamins-Minerals (MULTIVITAMIN & MINERAL PO) Take 1 tablet by mouth daily with lunch. Multivitamin with calcium 1024m, magnesium 4075mand zinc 15 mg per daughter.    Historical Provider, MD  Omega-3 Fatty Acids (FISH OIL) 1000 MG CAPS Take 1 capsule by mouth 4 (four) times daily.     Historical Provider, MD  [facility] LLCELICA LANCETS [id number] Use to check BG twice a day.  Dx:  250.03 03/05/13   FrVernie ShanksMD  pantoprazole (PROTONIX) 40 MG tablet Take 40 mg by mouth daily.    Historical Provider, MD  pantoprazole (PROTONIX) 40 MG tablet TAKE ONE TABLET BY MOUTH ONCE DAILY 01/23/13   FrVernie ShanksMD  polyethylene glycol ([facility] GLYCOLAX) packet Take 17 g by mouth daily. 02/27/13   FrVernie ShanksMD  potassium chloride SA (K-DUR,KLOR-CON) 20 MEQ tablet Take 1 tablet (20 mEq total) by mouth daily. 02/27/13   FrVernie ShanksMD  rosuvastatin (CRESTOR) 10 MG tablet Take 10 mg by mouth every other day. At bedtime    Historical Provider, MD  zolpidem (AMBIEN) 5 MG tablet Take 1 tablet (5 mg total) by mouth at bedtime as needed for sleep. 02/27/13   FrVernie ShanksMD    Family History Family History  Problem Relation Age of Onset  . Heart failure Mother   . COPD Father   . Diabetes Sister   . Heart failure Sister     Social History Social History  Substance Use Topics  . Smoking status: Former Smoker    Packs/day: 2.00    Years: 20.00    Types: Cigarettes  Quit date: 02/23/1991  . Smokeless tobacco: Never Used     Comment: Quite 20 Yrs Ago  . Alcohol use No     Comment: hx of no longer uses      Allergies   Aspirin; Penicillins; Sulfa antibiotics; and Latex   Review of Systems Review of Systems  Unable to perform ROS: Dementia      Physical Exam Updated Vital Signs BP 145/79 (BP Location: Left Arm)   Pulse 73   Temp 98.3 F (36.8 C) (Oral)   Resp 19   SpO2 97%   Physical Exam 1935: Physical examination:  Nursing notes reviewed; Vital signs and O2 SAT reviewed;  Constitutional: Well developed, Well nourished, Well hydrated, In no acute distress; Head:  Normocephalic, atraumatic; Eyes: EOMI, PERRL, No scleral icterus; ENMT: Mouth and pharynx normal, Mucous membranes moist; Neck: Supple, Full range of motion, No lymphadenopathy; Cardiovascular: Regular rate and rhythm, No gallop; Respiratory: Breath sounds clear & equal bilaterally, No wheezes.  Normal respiratory effort/excursion; Chest: Nontender, Movement normal; Abdomen: Soft, Nontender, Nondistended, Normal bowel sounds; Genitourinary: No CVA tenderness; Extremities: Pulses normal, No tenderness, No edema, No calf edema or asymmetry.; Neuro: Awake, alert, confused re: events.  No facial droop. Major CN grossly intact.  Speech slurred. +left sided weakness per hx. Strength 4/5 RUE and RLE..; Skin: Color normal, Warm, Dry.   ED  Treatments / Results  Labs (all labs ordered are listed, but only abnormal results are displayed)   EKG  EKG Interpretation  Date/Time:  Sunday November 16 2015 19:20:45 EDT Ventricular Rate:  74 PR Interval:    QRS Duration: 91 QT Interval:  397 QTC Calculation: 441 R Axis:   68 Text Interpretation:  Sinus rhythm Nonspecific ST and T wave abnormality When compared with ECG of 06/11/2012 QT has shortened Confirmed by [facility]  MD, Nunzio Cory [phone]) on 11/16/2015 7:59:46 PM       Radiology   Procedures Procedures (including critical care time)  Medications Ordered in ED Medications - No data to display   Initial Impression / Assessment and Plan / ED Course  I have reviewed the triage vital signs and the nursing notes.  Pertinent labs & imaging results that were available during my care of the patient were reviewed by me and considered in my medical decision making (see chart for details).  MDM Reviewed: previous chart, nursing note and vitals Reviewed previous: labs and ECG Interpretation: labs, ECG, x-ray and CT scan    Results for orders placed or performed during the hospital encounter of 11/16/15  CBC with Differential  Result Value Ref Range   WBC 10.3 4.0 - 10.5 K/uL   RBC 4.44 3.87 - 5.11 MIL/uL   Hemoglobin 13.5 12.0 - 15.0 g/dL   HCT 39.7 36.0 - 46.0 %   MCV 89.4 78.0 - 100.0 fL   MCH 30.4 26.0 - 34.0 pg   MCHC 34.0 30.0 - 36.0 g/dL   RDW 12.8 11.5 - 15.5 %   Platelets 243 150 - 400 K/uL   Neutrophils Relative % 59 %   Neutro Abs 6.1 1.7 - 7.7 K/uL   Lymphocytes Relative 29 %   Lymphs Abs 3.0 0.7 - 4.0 K/uL   Monocytes Relative 10 %   Monocytes Absolute 1.0 0.1 - 1.0 K/uL   Eosinophils Relative 2 %   Eosinophils Absolute 0.2 0.0 - 0.7 K/uL   Basophils Relative 0 %   Basophils Absolute 0.0 0.0 - 0.1 K/uL  Basic metabolic panel  Result Value  Ref Range   Sodium 132 (L) 135 - 145 mmol/L   Potassium 3.5 3.5 - 5.1 mmol/L   Chloride 98 (L) 101 - 111  mmol/L   CO2 27 22 - 32 mmol/L   Glucose, Bld 128 (H) 65 - 99 mg/dL   BUN 10 6 - 20 mg/dL   Creatinine, Ser 0.61 0.44 - 1.00 mg/dL   Calcium 9.2 8.9 - 10.3 mg/dL   GFR calc non Af Amer >60 >60 mL/min   GFR calc Af Amer >60 >60 mL/min   Anion gap 7 5 - 15  Troponin I  Result Value Ref Range   Troponin I 0.03 (HH) <0.03 ng/mL  Urinalysis, Routine w reflex microscopic  Result Value Ref Range   Color, Urine YELLOW YELLOW   APPearance CLEAR CLEAR   Specific Gravity, Urine 1.010 1.005 - 1.030   pH 7.0 5.0 - 8.0   Glucose, UA NEGATIVE NEGATIVE mg/dL   Hgb urine dipstick NEGATIVE NEGATIVE   Bilirubin Urine NEGATIVE NEGATIVE   Ketones, ur NEGATIVE NEGATIVE mg/dL   Protein, ur NEGATIVE NEGATIVE mg/dL   Nitrite POSITIVE (A) NEGATIVE   Leukocytes, UA MODERATE (A) NEGATIVE  Urine microscopic-add on  Result Value Ref Range   Squamous Epithelial / LPF 6-30 (A) NONE SEEN   WBC, UA 6-30 0 - 5 WBC/hpf   RBC / HPF 0-5 0 - 5 RBC/hpf   Bacteria, UA MANY (A) NONE SEEN   Dg Chest 1 View Result Date: 11/16/2015 CLINICAL DATA:  Slurred speech.  Hypertension. EXAM: CHEST 1 VIEW COMPARISON:  Single-view of the chest 09/26/2012. PA and lateral chest 02/04/2012. FINDINGS: There is cardiomegaly. Lungs are clear. No pneumothorax or pleural effusion. Aortic atherosclerosis is noted. IMPRESSION: No acute disease. Cardiomegaly. Atherosclerosis. Electronically Signed   By: Inge Rise M.D.   On: 11/16/2015 20:50   Ct Head Wo Contrast Result Date: 11/16/2015 CLINICAL DATA:  Acute onset of slurred speech and high blood pressure. Initial encounter. EXAM: CT HEAD WITHOUT CONTRAST TECHNIQUE: Contiguous axial images were obtained from the base of the skull through the vertex without intravenous contrast. COMPARISON:  CT of the head performed 12/05/2012 FINDINGS: Brain: No evidence of acute infarction, hemorrhage, hydrocephalus, extra-axial collection or mass lesion/mass effect. Prominence of the ventricles and sulci  reflects mild to moderate cortical volume loss. Mild cerebellar atrophy is noted. Diffuse periventricular and subcortical white matter change likely reflects small vessel ischemic microangiopathy. Mild chronic ischemic change is noted at the basal ganglia bilaterally. Ex vacuo dilatation of the right lateral ventricle reflects underlying chronic infarct. The brainstem and fourth ventricle are within normal limits. The cerebral hemispheres demonstrate grossly normal gray-white differentiation. No mass effect or midline shift is seen. Vascular: No hyperdense vessel or unexpected calcification. Skull: There is no evidence of fracture; visualized osseous structures are unremarkable in appearance. Sinuses/Orbits: The visualized portions of the orbits are within normal limits. The paranasal sinuses and mastoid air cells are well-aerated. Other: No significant soft tissue abnormalities are seen. IMPRESSION: 1. No acute intracranial pathology seen on CT. 2. Mild to moderate cortical volume loss and diffuse small vessel ischemic microangiopathy. 3. Mild chronic ischemic change at the basal ganglia bilaterally. 4. Ex vacuo dilatation of the right lateral ventricle reflects underlying chronic infarct. Electronically Signed   By: Garald Balding M.D.   On: 11/16/2015 20:34    2210:  Family concerned regarding new CVA. Will need MRI (not available until morning). Will observation admit for MRI brain and trend troponins. T/C to [facility]  Dr. Darrick Meigs, case discussed, including:  HPI, pertinent PM/SHx, VS/PE, dx testing, ED course and treatment:  Agreeable to admit, requests to write temporary orders, obtain observation tele bed to team APAdmits.    Final Clinical Impressions(s) / ED Diagnoses   Final diagnoses:  None    New Prescriptions New Prescriptions   No medications on file     Francine Graven, DO 11/18/15 1804

## 2015-11-16 NOTE — ED Triage Notes (Signed)
Pt brought in by [facility] for c/o high blood pressure and slurred speech; staff reports to ems that sx started today around 1045; pt states her speech sounds normal to her and pt is alert and oriented; pt was given clonidine 0.1mg  at 1641; initial bp at nursing home was 210/100 after the clonidine pressure down to 160/78; cbg by ems was 133. Pt c/o cough x 1 week that is new

## 2015-11-16 NOTE — ED Notes (Signed)
CRITICAL VALUE ALERT  Critical value received:  Troponin 0.03  Date of notification:  11/16/2015  Time of notification:  2135  Critical value read back:Yes.    Nurse who received alert:  Neldon Mc RN  MD notified (1st page):  Dr. Clarene Duke  Time of first page:  2153  MD notified (2nd page):  Time of second page:  Responding MD:    Time MD responded:

## 2015-11-16 NOTE — H&P (Addendum)
TRH H&P    Patient Demographics:    Sarah Skinner, is a 75 y.o. female  MRN: [id number]  DOB - 1940/04/20  Admit Date - 11/16/2015  Referring MD/NP/PA: Dr. Thurnell Garbe  Outpatient Primary MD for the patient is Pcp Not In System  Patient coming from: Skilled nursing facility  Chief Complaint  Patient presents with  . Hypertension  . Aphasia      HPI:    Sarah Skinner  is a 75 y.o. female, With history of stroke with resultant left-sided weakness, hypothyroidism, hypertension, dementia, diabetes mellitus was brought to the ED after patient started having slurred speech, and persistent high blood pressure. Patient's daughter provides the history says that she was called around 5 PM that patient has had persistent elevated systolic blood pressure in 200s, and slurred speech. Patient was given Catapres at 1641 with improvement of her blood pressure. She was brought to the hospital for further evaluation of slurred speech for possible stroke. Patient also started having perioral tremors for past 1 week.  Patient symptoms began around 10:45 AM, CT head is negative. She is out of [employer].  Patient denies blurred vision, no fever, no dysuria. No chest pain or shortness of breath. She has been coughing up phlegm for past 1 week No nausea vomiting or diarrhea.   Review of systems:    A full 10 point Review of Systems was done, except as stated above, all other Review of Systems were negative.   With Past History of the following :    Past Medical History:  Diagnosis Date  . Anginal pain ([place])    not currently seeing cardiologist  . Anxiety   . Arthritis    hands, feet , knees   . Blood transfusion    hx of with childbirth   . Dementia   . Diabetes mellitus   . Fibromyalgia   . GERD (gastroesophageal reflux disease)   . Headache(784.0)    hx of   . Hypertension   . Hypothyroidism   . Stress  incontinence    stress incontinence   . Stroke [facility])    left sided weakness       Past Surgical History:  Procedure Laterality Date  . ABDOMINAL HYSTERECTOMY    . CERVICAL FUSION    . CHOLECYSTECTOMY    . CYSTOSCOPY WITH INJECTION  05/18/2011   Procedure: CYSTOSCOPY WITH INJECTION;  Surgeon: Malka So, MD;  Location: [facility];  Service: Urology;  Laterality: N/A;  Injection of Macroplastique  . FLEXIBLE SIGMOIDOSCOPY  08/18/2011   Procedure: FLEXIBLE SIGMOIDOSCOPY;  Surgeon: Juanita Craver, MD;  Location: [facility] ENDOSCOPY;  Service: Endoscopy;  Laterality: N/A;  . RECTOCELE REPAIR    . TUBAL LIGATION    . WISDOM TOOTH EXTRACTION        Social History:      Social History  Substance Use Topics  . Smoking status: Former Smoker    Packs/day: 2.00    Years: 20.00    Types: Cigarettes    Quit date: 02/23/1991  . Smokeless  tobacco: Never Used     Comment: Quite 20 Yrs Ago  . Alcohol use No     Comment: hx of no longer uses        Family History :     Family History  Problem Relation Age of Onset  . Heart failure Mother   . COPD Father   . Diabetes Sister   . Heart failure Sister       Home Medications:   Prior to Admission medications   Medication Sig Start Date End Date Taking? Authorizing Provider  ALPRAZolam Duanne Moron) 0.5 MG tablet Take 1 tablet (0.5 mg total) by mouth 3 (three) times daily as needed for anxiety. 02/27/13   Vernie Shanks, MD  amLODipine (NORVASC) 5 MG tablet Take 5 mg by mouth every morning.    Historical Provider, MD  Blood Glucose Monitoring Suppl (ONE TOUCH ULTRA SYSTEM KIT) W/DEVICE KIT Use to check BG twice a day.  Dx:  250.03 03/05/13   Vernie Shanks, MD  cholecalciferol (VITAMIN D) 1000 UNITS tablet Take 1,000 Units by mouth every morning.     Historical Provider, MD  diclofenac (VOLTAREN) 50 MG EC tablet Take 1 tablet (50 mg total) by mouth 2 (two) times daily. 02/27/13   Vernie Shanks, MD  docusate sodium (COLACE) 100 MG capsule Take 200 mg by  mouth 2 (two) times daily. 08/20/11   Robbie Lis, MD  Gabapentin, PHN, (GRALISE) 600 MG TABS Take by mouth.    Historical Provider, MD  glucose blood test strip Use to check BG twice daily.  Dx:   250.03 03/05/13   Vernie Shanks, MD  insulin aspart (NOVOLOG) 100 UNIT/ML injection INJECT 3 UNITS BEFORE [facility] MEAL 02/27/13   Vernie Shanks, MD  insulin glargine (LANTUS) 100 UNIT/ML injection INJECT 31 UNITS AT BEDTIME 02/27/13   Vernie Shanks, MD  Lactobacillus Rhamnosus, GG, (CULTURELLE PO) Take 1 tablet by mouth daily.    Historical Provider, MD  levothyroxine (SYNTHROID, LEVOTHROID) 50 MCG tablet Take 1 tablet (50 mcg total) by mouth AC breakfast. 02/27/13   Vernie Shanks, MD  loratadine (CLARITIN) 10 MG tablet Take 10 mg by mouth daily.     Historical Provider, MD  metoprolol (LOPRESSOR) 50 MG tablet Take 1 tablet (50 mg total) by mouth 2 (two) times daily. 02/13/13   Vernie Shanks, MD  mirabegron ER (MYRBETRIQ) 25 MG TB24 Take 25 mg by mouth daily.    Historical Provider, MD  mirtazapine (REMERON) 30 MG tablet Take 30 mg by mouth at bedtime.    Historical Provider, MD  Multiple Vitamins-Minerals (MULTIVITAMIN & MINERAL PO) Take 1 tablet by mouth daily with lunch. Multivitamin with calcium 1050m, magnesium 4031mand zinc 15 mg per daughter.    Historical Provider, MD  Omega-3 Fatty Acids (FISH OIL) 1000 MG CAPS Take 1 capsule by mouth 4 (four) times daily.     Historical Provider, MD  [facility] 3396GISC Use to check BG twice a day.  Dx:  250.03 03/05/13   FrVernie ShanksMD  pantoprazole (PROTONIX) 40 MG tablet Take 40 mg by mouth daily.    Historical Provider, MD  pantoprazole (PROTONIX) 40 MG tablet TAKE ONE TABLET BY MOUTH ONCE DAILY 01/23/13   FrVernie ShanksMD  polyethylene glycol ([facility] GLYCOLAX) packet Take 17 g by mouth daily. 02/27/13   FrVernie ShanksMD  potassium chloride SA (K-DUR,KLOR-CON) 20 MEQ tablet Take 1 tablet (20 mEq total)  by mouth daily. 02/27/13   Vernie Shanks, MD  rosuvastatin (CRESTOR) 10 MG tablet Take 10 mg by mouth every other day. At bedtime    Historical Provider, MD  zolpidem (AMBIEN) 5 MG tablet Take 1 tablet (5 mg total) by mouth at bedtime as needed for sleep. 02/27/13   Vernie Shanks, MD     Allergies:     Allergies  Allergen Reactions  . Prilosec [Omeprazole] Other (See Comments)    Reaction:  Unknown   . Aspirin Rash  . Latex Rash  . Penicillins Rash and Other (See Comments)    Has patient had a PCN reaction causing immediate rash, facial/tongue/throat swelling, SOB or lightheadedness with hypotension: Unsure Has patient had a PCN reaction causing severe rash involving mucus membranes or skin necrosis: Unsure Has patient had a PCN reaction that required hospitalization Unsure Has patient had a PCN reaction occurring within the last 10 years: Unsure If all of the above answers are "NO", then may proceed with Cephalosporin use.  . Sulfa Antibiotics Rash     Physical Exam:   Vitals  Blood pressure 145/79, pulse 73, temperature 98.3 F (36.8 C), temperature source Oral, resp. rate 19, SpO2 97 %.  1.  General Elderly female in no acute distress, cooperative with exam  2. Psychiatric: Intact judgement and  insight, awake alert, oriented x 3.  3. Neurologic: Patient has residual left-sided weakness, motor 4/5 in left lower extremity, 2/5 in left upper extremity. Sensations intact. Cranial nerves 2-12 grossly intact  4. Eyes show anicteric sclerae, moist conjunctivae with no lid lag. PERRLA.  5. ENMT: Oropharynx clear with moist mucous membranes and good dentition. Perioral tremors noted.  5. Neck: supple, no cervical lymphadenopathy appriciated, No thyromegaly  6. Respiratory :Normal respiratory effort, good air movement bilaterally,clear to   auscultation bilaterally.  7. Cardiovascular :RRR, no gallops, rubs or murmurs, no leg edema  8. GI: Positive bowel sounds, abdomen soft, no tenderness to palpation, no   organomegaly appriciated,no rebound -guarding or rigidity.  9. Skin: No cyanosis, normal texture and turgor, no rash, lesions or ulcers  10.Musculoskeletal: Good muscle tone,  joints appear normal , no effusions, normal range of motion    Data Review:    CBC  Recent Labs Lab 11/16/15 2035  WBC 10.3  HGB 13.5  HCT 39.7  PLT 243  MCV 89.4  MCH 30.4  MCHC 34.0  RDW 12.8  LYMPHSABS 3.0  MONOABS 1.0  EOSABS 0.2  BASOSABS 0.0   ------------------------------------------------------------------------------------------------------------------  Chemistries   Recent Labs Lab 11/16/15 2035  NA 132*  K 3.5  CL 98*  CO2 27  GLUCOSE 128*  BUN 10  CREATININE 0.61  CALCIUM 9.2   ------------------------------------------------------------------------------------------------------------------  ------------------------------------------------------------------------------------------------------------------ GFR: CrCl cannot be calculated (Unknown ideal weight.). Liver Function Tests: No results for input(s): AST, ALT, ALKPHOS, BILITOT, PROT, ALBUMIN in the last 168 hours. No results for input(s): LIPASE, AMYLASE in the last 168 hours. No results for input(s): AMMONIA in the last 168 hours. Coagulation Profile: No results for input(s): INR, PROTIME in the last 168 hours. Cardiac Enzymes:  Recent Labs Lab 11/16/15 2035  TROPONINI 0.03*    --------------------------------------------------------------------------------------------------------------- Urine analysis:    Component Value Date/Time   COLORURINE YELLOW 11/16/2015 1935   APPEARANCEUR CLEAR 11/16/2015 1935   LABSPEC 1.010 11/16/2015 1935   PHURINE 7.0 11/16/2015 1935   GLUCOSEU NEGATIVE 11/16/2015 [place] NEGATIVE 11/16/2015 [place] NEGATIVE 11/16/2015 1935   BILIRUBINUR neg 12/26/2012  [place] 11/16/2015 1935   PROTEINUR NEGATIVE 11/16/2015 1935   UROBILINOGEN 0.2  01/08/2013 1839   NITRITE POSITIVE (A) 11/16/2015 1935   LEUKOCYTESUR MODERATE (A) 11/16/2015 1935      Imaging Results:    Dg Chest 1 View  Result Date: 11/16/2015 CLINICAL DATA:  Slurred speech.  Hypertension. EXAM: CHEST 1 VIEW COMPARISON:  Single-view of the chest 09/26/2012. PA and lateral chest 02/04/2012. FINDINGS: There is cardiomegaly. Lungs are clear. No pneumothorax or pleural effusion. Aortic atherosclerosis is noted. IMPRESSION: No acute disease. Cardiomegaly. Atherosclerosis. Electronically Signed   By: Inge Rise M.D.   On: 11/16/2015 20:50   Ct Head Wo Contrast  Result Date: 11/16/2015 CLINICAL DATA:  Acute onset of slurred speech and high blood pressure. Initial encounter. EXAM: CT HEAD WITHOUT CONTRAST TECHNIQUE: Contiguous axial images were obtained from the base of the skull through the vertex without intravenous contrast. COMPARISON:  CT of the head performed 12/05/2012 FINDINGS: Brain: No evidence of acute infarction, hemorrhage, hydrocephalus, extra-axial collection or mass lesion/mass effect. Prominence of the ventricles and sulci reflects mild to moderate cortical volume loss. Mild cerebellar atrophy is noted. Diffuse periventricular and subcortical white matter change likely reflects small vessel ischemic microangiopathy. Mild chronic ischemic change is noted at the basal ganglia bilaterally. Ex vacuo dilatation of the right lateral ventricle reflects underlying chronic infarct. The brainstem and fourth ventricle are within normal limits. The cerebral hemispheres demonstrate grossly normal gray-white differentiation. No mass effect or midline shift is seen. Vascular: No hyperdense vessel or unexpected calcification. Skull: There is no evidence of fracture; visualized osseous structures are unremarkable in appearance. Sinuses/Orbits: The visualized portions of the orbits are within normal limits. The paranasal sinuses and mastoid air cells are well-aerated. Other: No  significant soft tissue abnormalities are seen. IMPRESSION: 1. No acute intracranial pathology seen on CT. 2. Mild to moderate cortical volume loss and diffuse small vessel ischemic microangiopathy. 3. Mild chronic ischemic change at the basal ganglia bilaterally. 4. Ex vacuo dilatation of the right lateral ventricle reflects underlying chronic infarct. Electronically Signed   By: Garald Balding M.D.   On: 11/16/2015 20:34    My personal review of EKG: Rhythm NSR, nonspecific ST-T changes   Assessment & Plan:    Active Problems:   DM2 (diabetes mellitus, type 2) (HCC)   Slurred speech   TIA (transient ischemic attack)   Tardive dyskinesia   UTI (lower urinary tract infection)   1. TIA- patient slurred speech has resolved, CT head is negative. Will admit for TIA workup, check echocardiogram, MRI/MRA brain, carotid dopplers, hemoglobin A1c, lipid profile. Patient has aspirin allergy, will wait for MRI before starting antiplatelet therapy. 2. UTI- patient has abnormal UA, will start Levaquin for UTI. Follow urine culture results. 3. Tardive dyskinesia- patient has tardive dyskinesia involving perioral area, will hold antidepressant medications including Remeron and Myrbetriq. Continue Xanax 0.5 mg 3 times a day when necessary 4. Diabetes mellitus- continue Lantus 30 units subcutaneous daily. Start sliding scale insulin NovoLog 5. Cough- patient has cough for past 1 week, chest  x-ray showed no acute abnormality. Start patient on Robitussin-DM when necessary.   DVT Prophylaxis-   Lovenox   AM Labs Ordered, also please review Full Orders  Family Communication: Admission, patients condition and plan of care including tests being ordered have been discussed with the patient and her daughter and son at bedside who indicate understanding and agree with the plan and Code Status.  Code Status:  DO  NOT RESUSCITATE  Admission status: Observation    Time spent in minutes : 60  minutes   Dajour Pierpoint S M.D on 11/16/2015 at 10:48 PM  Between 7am to 7pm - Pager - [phone]. After 7pm go to [email or web address] - password [email or web address]  [facility] - Office  [phone]

## 2015-11-17 ENCOUNTER — Observation Stay (HOSPITAL_COMMUNITY): Payer: Medicare Other

## 2015-11-17 DIAGNOSIS — R4781 Slurred speech: Secondary | ICD-10-CM | POA: Diagnosis not present

## 2015-11-17 DIAGNOSIS — G2401 Drug induced subacute dyskinesia: Secondary | ICD-10-CM | POA: Diagnosis not present

## 2015-11-17 LAB — LIPID PANEL
CHOL/HDL RATIO: 2.9 ratio
Cholesterol: 107 mg/dL (ref 0–200)
HDL: 37 mg/dL — AB (ref >40)
LDL Cholesterol: 43 mg/dL (ref 0–99)
TRIGLYCERIDES: 135 mg/dL (ref <150)
VLDL: 27 mg/dL (ref 0–40)

## 2015-11-17 LAB — MRSA PCR SCREENING: MRSA BY PCR: NEGATIVE

## 2015-11-17 LAB — GLUCOSE, CAPILLARY
GLUCOSE-CAPILLARY: 141 mg/dL — AB (ref 65–99)
GLUCOSE-CAPILLARY: 137 mg/dL — AB (ref 65–99)

## 2015-11-17 NOTE — NC FL2 (Deleted)
[place] MEDICAID FL2 LEVEL OF CARE SCREENING TOOL     IDENTIFICATION  Patient Name: Sarah Skinner Birthdate: 05/22/1940 Sex: female Admission Date (Current Location): 11/16/2015  [place] and [place] Number:  Aaron Edelman [id number] [facility] Facility and Address:  [facility],  [address] [place] [address]      Provider Number: [id number]  Attending Physician Name and Address:  Micael Hampshire Acost*  Relative Name and Phone Number:       Current Level of Care: Hospital Recommended Level of Care: Skilled Nursing Facility Prior Approval Number:    Date Approved/Denied:   PASRR Number: [id number]  Discharge Plan: SNF    Current Diagnoses: Patient Active Problem List   Diagnosis Date Noted  . Slurred speech 11/16/2015  . TIA (transient ischemic attack) 11/16/2015  . Tardive dyskinesia 11/16/2015  . UTI (lower urinary tract infection) 11/16/2015  . Major depressive disorder, recurrent episode, moderate (HCC) 01/10/2013  . Type II or unspecified type diabetes mellitus with peripheral circulatory disorders, uncontrolled(250.72) 11/16/2012  . Mood disorder in conditions classified elsewhere 11/08/2012  . HLD (hyperlipidemia) 10/02/2012  . Dementia with behavioral disturbance 05/23/2012  . Aspiration of foreign body in respiratory tract 01/27/2012  . DM2 (diabetes mellitus, type 2) (HCC) 01/27/2012  . HTN (hypertension) 01/27/2012  . CVA (cerebral infarction) 01/27/2012  . Hypokalemia 08/17/2011  . Fatty liver with probable cirrhosis by CT scan 08/17/2011  . Chronic constipation 08/17/2011  . Proctitis with rectal bleeding, nausea and vomiting 08/17/2011  . Leukocytosis 08/17/2011  . Hyponatremia 08/17/2011  . Hypothyroidism 08/17/2011  . GERD (gastroesophageal reflux disease) 08/17/2011  . Stress incontinence   . DM (diabetes mellitus) (HCC) 05/11/2010  . Hypertension 05/11/2010  . Depression with anxiety 05/11/2010  . Fibromyalgia 05/11/2010  .  Diverticulitis 05/11/2010  . 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 See d/c summary for updates)  AMBULATORY STATUS COMMUNICATION OF NEEDS Skin   Total Care Verbally Other (Comment) (Moisture associated skin damage groin)                       Personal Care Assistance Level of Assistance  Bathing, Feeding, Dressing Bathing Assistance: Maximum assistance Feeding assistance: Limited assistance Dressing Assistance: Maximum assistance     Functional Limitations Info  Sight, Hearing, Speech Sight Info: Impaired Hearing Info: Adequate Speech Info: Adequate    SPECIAL CARE FACTORS FREQUENCY                       Contractures Contractures Info: Present    Additional Factors Info  Insulin Sliding Scale, Psychotropic Code Status Info: DNR Allergies Info: Prilosec (Omeprazole), Aspirin, Latex, Penicillins, Sulfa Antibiotics Psychotropic Info: Xanax   Isolation Precautions Info: Contact precautions     Current Medications (11/17/2015):  This is the current hospital active medication list Current Facility-Administered Medications  Medication Dose Route Frequency Provider Last Rate Last Dose  . 0.9 %  sodium chloride infusion   Intravenous Continuous 11/19/2015, MD 50 mL/hr at 11/16/15 2350    . ALPRAZolam 11/18/15) tablet 0.5 mg  0.5 mg Oral TID PRN Prudy Feeler, MD      . enoxaparin (LOVENOX) injection  40 mg  40 mg Subcutaneous Q24H Meredeth Ide, MD   40 mg at 11/17/15 0924  . insulin aspart (novoLOG) injection 0-9 Units  0-9 Units Subcutaneous TID WC Meredeth Ide, MD   1 Units at 11/17/15 0840  . insulin glargine (LANTUS) injection 30 Units  30 Units  Subcutaneous QHS Meredeth Ide, MD      . levofloxacin (LEVAQUIN) IVPB 500 mg  500 mg Intravenous Q24H Meredeth Ide, MD   500 mg at 11/16/15 2350  . levothyroxine (SYNTHROID, LEVOTHROID) tablet 50 mcg  50 mcg Oral QAC breakfast Meredeth Ide, MD      . omega-3 acid ethyl esters (LOVAZA) capsule 1 g  1 g Oral BID Meredeth Ide, MD      . pantoprazole (PROTONIX) EC tablet 40 mg  40 mg Oral Daily Meredeth Ide, MD      . rosuvastatin (CRESTOR) tablet 10 mg  10 mg Oral QODAY Meredeth Ide, MD      . senna-docusate (Senokot-S) tablet 1 tablet  1 tablet Oral QHS PRN Meredeth Ide, MD         Discharge Medications: Please see discharge summary for a list of discharge medications.  Relevant Imaging Results:  Relevant Lab Results:   Additional Information    Karn [place], [place] [phone]

## 2015-11-17 NOTE — Clinical Social Work Note (Signed)
Pt d/c today back to [facility]. Pt, family, and facility aware and agreeable. Facility to transport pt via [facility]. Agreeable to no FL2 due to <24 hour observation.  Derenda Fennel, LCSW [phone]

## 2015-11-17 NOTE — Care Management Obs Status (Signed)
MEDICARE OBSERVATION STATUS NOTIFICATION   Patient Details  Name: Sarah Skinner MRN: [id number] Date of Birth: 04/06/1940   Medicare Observation Status Notification Given:  Yes    Malcolm Metro, RN 11/17/2015, 12:30 PM

## 2015-11-17 NOTE — Evaluation (Signed)
Occupational Therapy Evaluation Patient Details Name: Sarah Skinner MRN: [id number] DOB: [birth year] Today's Date: 11/17/2015    History of Present Illness Sarah Skinner  is a 75 y.o. female, With history of stroke with resultant left-sided weakness, hypothyroidism, hypertension, dementia, diabetes mellitus was brought to the ED after patient started having slurred speech, and persistent high blood pressure. Patient's daughter provides the history says that she was called around 5 PM that patient has had persistent elevated systolic blood pressure in 200s, and slurred speech.   Clinical Impression   Pt returning from MRI and [place] upon therapy arrival. Patient agreeable to participate in OT evaluation. Regarding patient's slurred speech, she reports that is has resolved.  No new deficits noted regarding possible TIA. Patient has residual Left side weakness from CVA. Pt reports that it happened 3 months ago and that she has been receiving therapy at SNF. Pt with LUE contracture and states she does not wear a brace. Patient's LUE was positioned correctly while seated in chair with pillow under Left arm and rolled washcloth in left hand to prevent skin breakdown. Recommend that patient return to SNF and continue receiving therapy services.    Follow Up Recommendations  SNF (Recommend that patient return to SNF to continue therapy services)    Equipment Recommendations  None recommended by OT    Recommendations for Other Services       Precautions / Restrictions Precautions Precautions: Fall Precaution Comments: Residual Left side weakness from previous CVA      Mobility Bed Mobility Overal bed mobility: Needs Assistance Bed Mobility: Supine to Sit;Rolling;Sidelying to Sit Rolling: Total assist Sidelying to sit: Total assist Supine to sit: Total assist     General bed mobility comments: Pt required cueing to maintain static sitting balance once on EOB. Patient able to maintain for  approximately 1 minute with supervision.  Transfers Overall transfer level: Needs assistance Equipment used: None Transfers: Squat Pivot Transfers     Squat pivot transfers: Total assist               ADL Overall ADL's : Needs assistance/impaired         Upper Body Bathing: Bed level;Total assistance   Lower Body Bathing: Bed level;Total assistance   Upper Body Dressing : Bed level;Total assistance   Lower Body Dressing: Bed level;Total assistance   Toilet Transfer: Total assistance;Squat-pivot           Functional mobility during ADLs: Total assistance;Cueing for safety;Cueing for sequencing General ADL Comments: Transfer towards right side.               Pertinent Vitals/Pain Pain Assessment:  (Pt reports that her LUE hurts. No number given.)     Hand Dominance Right   Extremity/Trunk Assessment Upper Extremity Assessment Upper Extremity Assessment: Generalized weakness;LUE deficits/detail (RUE: generalized weakness.) LUE Deficits / Details: 1/5 shoulder and elbow ranges. 0/5 wrist and hand. Patient presents with moderate contracture in left hand and wrist. Slight spaticity noted in elbow and shoulder. Pt with hx of previous Left CVA.   Lower Extremity Assessment Lower Extremity Assessment: Defer to PT evaluation       Communication Communication Communication: No difficulties   Cognition Arousal/Alertness: Lethargic Behavior During Therapy: Flat affect Overall Cognitive Status: History of cognitive impairments - at baseline                                Home Living Family/patient expects  to be discharged to:: Skilled nursing facility                                        Prior Functioning/Environment Level of Independence: Needs assistance        Comments: Per patient, she states she has been in a SNF for 3 months. Unable to tell therapist which one. States she has been receiving therapy and only requires 1  person assist for transfers. patient receives total care for bathing and dressing. Pt states that she does not ambulate.                               End of Session Equipment Utilized During Treatment: Gait belt  Activity Tolerance: Patient tolerated treatment well Patient left: in chair;with call bell/phone within reach;with nursing/sitter in room   Time: [phone] OT Time Calculation (min): 37 min Charges:  OT General Charges $OT Visit: 1 Procedure OT Evaluation $OT Eval Low Complexity: 1 Procedure G-Codes: OT G-codes **NOT FOR INPATIENT CLASS** Functional Limitation: Self care Self Care Current Status (X0940): 100 percent impaired, limited or restricted Self Care Goal Status (H6808): 100 percent impaired, limited or restricted Self Care Discharge Status (U1103): 100 percent impaired, limited or restricted  11/17/2015, 9:34 AM Limmie Patricia, OTR/L,CBIS  [phone]

## 2015-11-17 NOTE — Care Management Note (Signed)
Case Management Note  Patient Details  Name: MALANA EBERWEIN MRN: [id number] Date of Birth: [birth year]  Subjective/Objective:                  Pt admitted with slurred speech. She is from [place], [place]. Anticipate pt will DC back to [facility] today. CSW is aware and will make arangements for return to facility.   Action/Plan: No CM needs anticipated at this time.   Expected Discharge Date:     11/17/2015             Expected Discharge Plan:  Skilled Nursing Facility  In-House Referral:  Clinical Social Work  Discharge planning Services  CM Consult  Post Acute Care Choice:  NA Choice offered to:  NA  DME Arranged:    DME Agency:     HH Arranged:    HH Agency:     Status of Service:  Completed, signed off  If discussed at [employer], dates discussed:    Additional Comments:  Malcolm Metro, RN 11/17/2015, 12:30 PM

## 2015-11-17 NOTE — Discharge Summary (Signed)
Physician Discharge Summary  Sarah Skinner LKT:[id number] DOB: 20-Jan-1941 DOA: 11/16/2015  PCP: Pcp Not In System  Admit date: 11/16/2015 Discharge date: 11/17/2015  Time spent: 45 minutes  Recommendations for Outpatient Follow-up:  -Will be discharged back to SNF today.   Discharge Diagnoses:  Active Problems:   DM2 (diabetes mellitus, type 2) (HCC)   Slurred speech   TIA (transient ischemic attack)   Tardive dyskinesia   UTI (lower urinary tract infection)   Discharge Condition: Stable and improved  Filed Weights   11/16/15 2330  Weight: 77.2 kg (170 lb 3.1 oz)    History of present illness:  As per Dr. Sharl Ma on 9/24: Sarah Skinner  is a 75 y.o. female, With history of stroke with resultant left-sided weakness, hypothyroidism, hypertension, dementia, diabetes mellitus was brought to the ED after patient started having slurred speech, and persistent high blood pressure. Patient's daughter provides the history says that she was called around 5 PM that patient has had persistent elevated systolic blood pressure in 200s, and slurred speech. Patient was given Catapres at 1641 with improvement of her blood pressure. She was brought to the hospital for further evaluation of slurred speech for possible stroke. Patient also started having perioral tremors for past 1 week.  Patient symptoms began around 10:45 AM, CT head is negative. She is out of [employer].  Patient denies blurred vision, no fever, no dysuria. No chest pain or shortness of breath. She has been coughing up phlegm for past 1 week No nausea vomiting or diarrhea.  Hospital Course:   TIA -Symptoms, resolved at baseline. -Will DC back to SNF. -Carotid Dopplers: IMPRESSION: Color duplex indicates minimal heterogeneous and calcified plaque, with no hemodynamically significant stenosis by duplex criteria in the extracranial cerebrovascular circulation. -ECHO not obtained. -Not on ASA due to  rash.  Asymptomatic Bacteriuria  -Dirty UA but no UTI symptoms. -Received a dose of levaquin in the ED. -Will elect to not further treat with abx upon DC.  Tardive Dyskinesia -Continue home medications.  IDDM -Fair control. -Continue lantus.  Procedures:  None   Consultations:  None  Discharge Instructions  Discharge Instructions    Diet - low sodium heart healthy    Complete by:  As directed    Increase activity slowly    Complete by:  As directed        Medication List    TAKE these medications   ALPRAZolam 0.25 MG tablet Commonly known as:  XANAX Take 0.25 mg by mouth 2 (two) times daily as needed for anxiety.   amLODipine 5 MG tablet Commonly known as:  NORVASC Take 5 mg by mouth daily.   cetirizine 10 MG tablet Commonly known as:  ZYRTEC Take 10 mg by mouth daily.   CULTURELLE DIGESTIVE HEALTH Caps Take 1 capsule by mouth daily.   diclofenac 1.3 % Ptch Commonly known as:  FLECTOR Place 1 patch onto the skin 2 (two) times daily. Pt applies to right knee.   diclofenac 50 MG EC tablet Commonly known as:  VOLTAREN Take 1 tablet (50 mg total) by mouth 2 (two) times daily.   docusate sodium 100 MG capsule Commonly known as:  COLACE Take 200 mg by mouth 2 (two) times daily.   gabapentin 400 MG capsule Commonly known as:  NEURONTIN Take 400 mg by mouth 2 (two) times daily.   HYDROcodone-acetaminophen 5-325 MG tablet Commonly known as:  NORCO/VICODIN Take 1 tablet by mouth every 12 (twelve) hours.  insulin glargine 100 UNIT/ML injection Commonly known as:  LANTUS Inject 23 Units into the skin at bedtime.   ipratropium-albuterol 0.5-2.5 (3) MG/3ML Soln Commonly known as:  DUONEB Take 3 mLs by nebulization every 4 (four) hours as needed (for shortness of breath/wheezing).   levothyroxine 50 MCG tablet Commonly known as:  SYNTHROID, LEVOTHROID Take 50 mcg by mouth daily before breakfast.   lisinopril 5 MG tablet Commonly known as:   PRINIVIL,ZESTRIL Take 5 mg by mouth daily.   metoprolol 50 MG tablet Commonly known as:  LOPRESSOR Take 1 tablet (50 mg total) by mouth 2 (two) times daily.   mirabegron ER 25 MG Tb24 tablet Commonly known as:  MYRBETRIQ Take 25 mg by mouth daily.   multivitamin with minerals Tabs tablet Take 1 tablet by mouth daily.   OCUVITE EXTRA Tabs Take 1 tablet by mouth 2 (two) times daily.   omega-3 acid ethyl esters 1 g capsule Commonly known as:  LOVAZA Take 1 g by mouth 4 (four) times daily.   pantoprazole 40 MG tablet Commonly known as:  PROTONIX Take 40 mg by mouth daily before breakfast.   polyvinyl alcohol 1.4 % ophthalmic solution Commonly known as:  LIQUIFILM TEARS Place 1 drop into the left eye 4 (four) times daily.   potassium chloride SA 20 MEQ tablet Commonly known as:  K-DUR,KLOR-CON Take 1 tablet (20 mEq total) by mouth daily.   promethazine 25 MG/ML injection Commonly known as:  PHENERGAN Inject 25 mg into the muscle every 2 (two) hours as needed for nausea or vomiting.   rosuvastatin 10 MG tablet Commonly known as:  CRESTOR Take 10 mg by mouth at bedtime.   senna 8.6 MG Tabs tablet Commonly known as:  SENOKOT Take 2 tablets by mouth every other day.   sertraline 25 MG tablet Commonly known as:  ZOLOFT Take 25 mg by mouth daily. Pt takes with a 100mg  tablet.   sertraline 100 MG tablet Commonly known as:  ZOLOFT Take 100 mg by mouth daily. Pt takes with a 25mg  tablet.   TRAVATAN Z 0.004 % Soln ophthalmic solution Generic drug:  Travoprost (BAK Free) Place 1 drop into both eyes at bedtime.   Vitamin D (Ergocalciferol) 50000 units Caps capsule Commonly known as:  DRISDOL Take 50,000 Units by mouth every 30 (thirty) days. Pt takes on the 25th.      Allergies  Allergen Reactions  . Prilosec [Omeprazole] Other (See Comments)    Reaction:  Unknown   . Aspirin Rash  . Latex Rash  . Penicillins Rash and Other (See Comments)    Has patient had a PCN  reaction causing immediate rash, facial/tongue/throat swelling, SOB or lightheadedness with hypotension: Unsure Has patient had a PCN reaction causing severe rash involving mucus membranes or skin necrosis: Unsure Has patient had a PCN reaction that required hospitalization Unsure Has patient had a PCN reaction occurring within the last 10 years: Unsure If all of the above answers are "NO", then may proceed with Cephalosporin use.  . Sulfa Antibiotics Rash      The results of significant diagnostics from this hospitalization (including imaging, microbiology, ancillary and laboratory) are listed below for reference.    Significant Diagnostic Studies: Dg Chest 1 View  Result Date: 11/16/2015 CLINICAL DATA:  Slurred speech.  Hypertension. EXAM: CHEST 1 VIEW COMPARISON:  Single-view of the chest 09/26/2012. PA and lateral chest 02/04/2012. FINDINGS: There is cardiomegaly. Lungs are clear. No pneumothorax or pleural effusion. Aortic atherosclerosis is noted. IMPRESSION: No  acute disease. Cardiomegaly. Atherosclerosis. Electronically Signed   By: Drusilla Kanner M.D.   On: 11/16/2015 20:50   Ct Head Wo Contrast  Result Date: 11/16/2015 CLINICAL DATA:  Acute onset of slurred speech and high blood pressure. Initial encounter. EXAM: CT HEAD WITHOUT CONTRAST TECHNIQUE: Contiguous axial images were obtained from the base of the skull through the vertex without intravenous contrast. COMPARISON:  CT of the head performed 12/05/2012 FINDINGS: Brain: No evidence of acute infarction, hemorrhage, hydrocephalus, extra-axial collection or mass lesion/mass effect. Prominence of the ventricles and sulci reflects mild to moderate cortical volume loss. Mild cerebellar atrophy is noted. Diffuse periventricular and subcortical white matter change likely reflects small vessel ischemic microangiopathy. Mild chronic ischemic change is noted at the basal ganglia bilaterally. Ex vacuo dilatation of the right lateral ventricle  reflects underlying chronic infarct. The brainstem and fourth ventricle are within normal limits. The cerebral hemispheres demonstrate grossly normal gray-white differentiation. No mass effect or midline shift is seen. Vascular: No hyperdense vessel or unexpected calcification. Skull: There is no evidence of fracture; visualized osseous structures are unremarkable in appearance. Sinuses/Orbits: The visualized portions of the orbits are within normal limits. The paranasal sinuses and mastoid air cells are well-aerated. Other: No significant soft tissue abnormalities are seen. IMPRESSION: 1. No acute intracranial pathology seen on CT. 2. Mild to moderate cortical volume loss and diffuse small vessel ischemic microangiopathy. 3. Mild chronic ischemic change at the basal ganglia bilaterally. 4. Ex vacuo dilatation of the right lateral ventricle reflects underlying chronic infarct. Electronically Signed   By: Roanna Raider M.D.   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 Electronically Signed   By: Lacy Duverney M.D.   On: 11/17/2015 09:00   Result Date: 11/17/2015 CLINICAL DATA:  75 year old diabetic hypertensive female presenting with slurred speech yesterday. Subsequent encounter. EXAM: MRI HEAD WITHOUT CONTRAST MRA HEAD WITHOUT CONTRAST TECHNIQUE: Multiplanar, multiecho pulse sequences of the brain and surrounding structures were obtained without intravenous contrast. Angiographic images of the head were obtained using MRA technique without contrast. COMPARISON:  11/16/2015 head CT. 10/21/2008 brain MR and MR angiogram. FINDINGS: Exam is motion degraded. Patient was only able to complete seven sequences. MRI HEAD FINDINGS Brain: No acute infarct or intracranial hemorrhage. Remote posterior right basal  ganglia/ corona radiata infarct with subsequent dilation of the right lateral ventricle. Wallerian degeneration. Remote small right thalamic infarct. Moderate-to-marked chronic microvascular changes. Moderate global atrophy. Ventricular prominence felt to be related to atrophy rather than hydrocephalus. Cystic encephalomalacia anterior left temporal lobe may reflect result of prior trauma/insult and I suspect without change (previously best appreciated on series 10 coronal T2 imaging). If there were progressive clinical changes, this could be followed up with MR imaging to exclude the much less likely consideration of underlying mass. No intracranial mass lesion noted on this unenhanced exam. Vascular: As below Skull and upper cervical spine: Spinal stenosis upper cervical spine with cord flattening C2-3 and C3-4 level incompletely assessed. Sinuses/Orbits: No acute orbital abnormality. Partial opacification inferior right maxillary sinus. Mild mucosal thickening ethmoid sinus air cells. Other: Negative. MRA HEAD FINDINGS Marked narrowing right middle cerebral artery M1 segment/ bifurcation. Middle cerebral artery mild to moderate branch vessel narrowing and irregularity bilaterally. Marked narrowing proximal and distal A1 segment right anterior cerebral artery. Moderate to marked narrowing A2  segment anterior cerebral artery bilaterally. Mild to moderate narrowing versus artifact petrous and pre cavernous segment internal carotid artery bilaterally. Mild to moderate narrowing distal vertebral artery more notable on the right. Nonvisualized posterior inferior cerebellar artery and anterior inferior cerebellar artery bilaterally. Marked narrowing posterior cerebral artery bilaterally greater on the left. IMPRESSION: Exam is motion degraded and patient was not able to complete all sequences. No acute infarct. Remote infarcts and prominent chronic microvascular changes as detailed above. Anterior left temporal lobe  abnormality probably related to prior infarct/insult. Please see above discussion. Significant intracranial atherosclerotic changes as detailed above. Electronically Signed: By: Lacy Duverney M.D. 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 Electronically Signed   By: Lacy Duverney M.D.   On: 11/17/2015 09:00   Result Date: 11/17/2015 CLINICAL DATA:  75 year old diabetic hypertensive female presenting with slurred speech yesterday. Subsequent encounter. EXAM: MRI HEAD WITHOUT CONTRAST MRA HEAD WITHOUT CONTRAST TECHNIQUE: Multiplanar, multiecho pulse sequences of the brain and surrounding structures were obtained without intravenous contrast. Angiographic images of the head were obtained using MRA technique without contrast. COMPARISON:  11/16/2015 head CT. 10/21/2008 brain MR and MR angiogram. FINDINGS: Exam is motion degraded. Patient was only able to complete seven sequences. MRI HEAD FINDINGS Brain: No acute infarct or intracranial hemorrhage. Remote posterior right basal ganglia/ corona radiata infarct with subsequent dilation of the right lateral ventricle. Wallerian degeneration. Remote small right thalamic infarct. Moderate-to-marked chronic microvascular changes. Moderate global atrophy. Ventricular prominence felt to be related to atrophy rather than hydrocephalus. Cystic encephalomalacia anterior left temporal lobe may reflect result of prior trauma/insult and I suspect without change (previously best appreciated on series 10 coronal T2 imaging). If there were progressive clinical changes, this could be followed up with MR imaging to exclude the much less likely consideration of underlying mass. No intracranial mass lesion noted on this unenhanced exam. Vascular: As below Skull and upper cervical  spine: Spinal stenosis upper cervical spine with cord flattening C2-3 and C3-4 level incompletely assessed. Sinuses/Orbits: No acute orbital abnormality. Partial opacification inferior right maxillary sinus. Mild mucosal thickening ethmoid sinus air cells. Other: Negative. MRA HEAD FINDINGS Marked narrowing right middle cerebral artery M1 segment/ bifurcation. Middle cerebral artery mild to moderate branch vessel narrowing and irregularity bilaterally. Marked narrowing proximal and distal A1 segment right anterior cerebral artery. Moderate to marked narrowing A2 segment anterior cerebral artery bilaterally. Mild to moderate narrowing versus artifact petrous and pre cavernous segment internal carotid artery bilaterally. Mild to moderate narrowing distal vertebral artery more notable on the right. Nonvisualized posterior inferior cerebellar artery and anterior inferior cerebellar artery bilaterally. Marked narrowing posterior cerebral artery bilaterally greater on the left. IMPRESSION: Exam is motion degraded and patient was not able to complete all sequences. No acute infarct. Remote infarcts and prominent chronic microvascular changes as detailed above. Anterior left temporal lobe abnormality probably related to prior infarct/insult. Please see above discussion. Significant intracranial atherosclerotic changes as detailed above. Electronically Signed: By: Lacy Duverney M.D. On: 11/17/2015 08:44   US Carotid Bilateral (at [facility] Only)  Result Date: 11/17/2015 CLINICAL DATA:  75 year old female with a history of TIA. Cardiovascular risk factors include hypertension, stroke, tobacco use EXAM: BILATERAL CAROTID DUPLEX ULTRASOUND TECHNIQUE: Wallace Cullens scale imaging, color Doppler and duplex ultrasound were performed of bilateral carotid and vertebral arteries  in the neck. COMPARISON:  No prior duplex FINDINGS: Criteria: Quantification of carotid stenosis is based on velocity parameters that correlate the residual  internal carotid diameter with NASCET-based stenosis levels, using the diameter of the distal internal carotid lumen as the denominator for stenosis measurement. The following velocity measurements were obtained: RIGHT ICA:  Systolic 106 cm/sec, Diastolic 14 cm/sec CCA:  73 cm/sec SYSTOLIC ICA/CCA RATIO:  1.5 ECA:  165 cm/sec LEFT ICA:  Systolic 72 cm/sec, Diastolic 9 cm/sec CCA:  72 cm/sec SYSTOLIC ICA/CCA RATIO:  1.1 ECA:  84 cm/sec Right Brachial SBP: Not acquired Left Brachial SBP: Not acquired RIGHT CAROTID ARTERY: No significant calcifications of the right common carotid artery. Intermediate waveform maintained. Heterogeneous and partially calcified plaque at the right carotid bifurcation. No significant lumen shadowing. Low resistance waveform of the right ICA. No significant tortuosity. RIGHT VERTEBRAL ARTERY: Antegrade flow with low resistance waveform. LEFT CAROTID ARTERY: No significant calcifications of the left common carotid artery. Intermediate waveform maintained. Heterogeneous and partially calcified plaque at the left carotid bifurcation without significant lumen shadowing. Low resistance waveform of the left ICA. No significant tortuosity. LEFT VERTEBRAL ARTERY:  Antegrade flow with low resistance waveform. IMPRESSION: Color duplex indicates minimal heterogeneous and calcified plaque, with no hemodynamically significant stenosis by duplex criteria in the extracranial cerebrovascular circulation. Signed, Yvone NeuJaime S. Loreta AveWagner, DO [facility] Electronically Signed   By: Gilmer MorJaime  Wagner D.O.   On: 11/17/2015 09:01    Microbiology: Recent Results (from the past 240 hour(s))  MRSA PCR Screening     Status: None   Collection Time: 11/16/15 11:50 PM  Result Value Ref Range Status   MRSA by PCR NEGATIVE NEGATIVE Final    Comment:        The GeneXpert MRSA Assay (FDA approved for NASAL specimens only), is one component of a comprehensive MRSA  colonization surveillance program. It is not intended to diagnose MRSA infection nor to guide or monitor treatment for MRSA infections.      Labs: Basic Metabolic Panel:  Recent Labs Lab 11/16/15 2035  NA 132*  K 3.5  CL 98*  CO2 27  GLUCOSE 128*  BUN 10  CREATININE 0.61  CALCIUM 9.2   Liver Function Tests: No results for input(s): AST, ALT, ALKPHOS, BILITOT, PROT, ALBUMIN in the last 168 hours. No results for input(s): LIPASE, AMYLASE in the last 168 hours. No results for input(s): AMMONIA in the last 168 hours. CBC:  Recent Labs Lab 11/16/15 2035  WBC 10.3  NEUTROABS 6.1  HGB 13.5  HCT 39.7  MCV 89.4  PLT 243   Cardiac Enzymes:  Recent Labs Lab 11/16/15 2035  TROPONINI 0.03*   BNP: BNP (last 3 results) No results for input(s): BNP in the last 8760 hours.  ProBNP (last 3 results) No results for input(s): PROBNP in the last 8760 hours.  CBG:  Recent Labs Lab 11/16/15 2356 11/17/15 0726 11/17/15 1118  GLUCAP 140* 141* 137*       Signed:  HERNANDEZ ACOSTA,Herron Fero  [facility] Pager: [phone] 11/17/2015, 12:37 PM

## 2015-11-17 NOTE — Evaluation (Signed)
Clinical/Bedside Swallow Evaluation Patient Details  Name: Sarah Skinner MRN: [id number] Date of Birth: [birth year]  Today's Date: 11/17/2015 Time: SLP Start Time (ACUTE ONLY): 1308 SLP Stop Time (ACUTE ONLY): 1336 SLP Time Calculation (min) (ACUTE ONLY): 28 min  Past Medical History:  Past Medical History:  Diagnosis Date  . Anginal pain (HCC)    not currently seeing cardiologist  . Anxiety   . Arthritis    hands, feet , knees   . Blood transfusion    hx of with childbirth   . Dementia   . Diabetes mellitus   . Fibromyalgia   . GERD (gastroesophageal reflux disease)   . Headache(784.0)    hx of   . Hypertension   . Hypothyroidism   . Stress incontinence    stress incontinence   . Stroke [facility])    left sided weakness    Past Surgical History:  Past Surgical History:  Procedure Laterality Date  . ABDOMINAL HYSTERECTOMY    . CERVICAL FUSION    . CHOLECYSTECTOMY    . CYSTOSCOPY WITH INJECTION  05/18/2011   Procedure: CYSTOSCOPY WITH INJECTION;  Surgeon: Anner Crete, MD;  Location: [facility];  Service: Urology;  Laterality: N/A;  Injection of Macroplastique  . FLEXIBLE SIGMOIDOSCOPY  08/18/2011   Procedure: FLEXIBLE SIGMOIDOSCOPY;  Surgeon: Charna Elizabeth, MD;  Location: [facility];  Service: Endoscopy;  Laterality: N/A;  . RECTOCELE REPAIR    . TUBAL LIGATION    . WISDOM TOOTH EXTRACTION     HPI:  Sarah Skinner a 75 y.o.female,With history of stroke with resultant left-sided weakness, hypothyroidism, hypertension, dementia, diabetes mellitus was brought to the ED after patient started having slurred speech,and persistent high blood pressure. Patient's daughter provides the history says that she was called around 5 PM that patient has had persistent elevated systolic blood pressure in 200s, and slurred speech.   Assessment / Plan / Recommendation Clinical Impression  Pt seen while sitting up in chair for BSE due to failing RN stroke swallow screen last night. Family  present for evaluation and provided background information. They report that pt "aspirated a pea" several years ago and required bronchoscopy for removal. She has had her diet altered in the past (chopped), but received regular textures at [facility] most recently. Oral motor examination was unremarkable/WFL. Pt observed with ice chips, thin water, applesauce, and crackers. Pt exhibited pocketing and lingual residue after crackers, which cleared with liquid wash (SLP cues). No overt signs or symptoms of aspiration noted clinically, however aspiration, reflux, and oral care precautions were reviewed with pt and family. Recommend regular textures with thin liquids with intermittent supervision to cue for alternating solids/liquids to clear oral cavity. Pt/family in agreement with plan of care.    Aspiration Risk  Mild aspiration risk    Diet Recommendation Thin liquid;Regular   Liquid Administration via: Cup;Straw Medication Administration: Whole meds with liquid Supervision: Patient able to self feed;Intermittent supervision to cue for compensatory strategies Compensations: Follow solids with liquid Postural Changes: Seated upright at 90 degrees;Remain upright for at least 30 minutes after po intake    Other  Recommendations Oral Care Recommendations: Oral care BID;Staff/trained caregiver to provide oral care Other Recommendations: Clarify dietary restrictions   Follow up Recommendations None      Frequency and Duration            Prognosis Prognosis for Safe Diet Advancement: Good      Swallow Study   General Date of Onset: 11/16/15 HPI: Sarah Skinner a 75  y.o.female,With history of stroke with resultant left-sided weakness, hypothyroidism, hypertension, dementia, diabetes mellitus was brought to the ED after patient started having slurred speech,and persistent high blood pressure. Patient's daughter provides the history says that she was called around 5 PM that patient has  had persistent elevated systolic blood pressure in 200s, and slurred speech. Type of Study: Bedside Swallow Evaluation Previous Swallow Assessment: None on record Diet Prior to this Study: NPO (pt failed RN swallow screen last night) Temperature Spikes Noted: No Respiratory Status: Room air History of Recent Intubation: No Behavior/Cognition: Alert;Cooperative;Pleasant mood Oral Cavity Assessment: Within Functional Limits Oral Care Completed by SLP: Yes Oral Cavity - Dentition: Dentures, top Vision: Functional for self-feeding Self-Feeding Abilities: Able to feed self;Needs set up Patient Positioning: Upright in chair Baseline Vocal Quality: Normal Volitional Cough: Strong;Congested (dry congested cough) Volitional Swallow: Able to elicit    Oral/Motor/Sensory Function Overall Oral Motor/Sensory Function: Within functional limits   Ice Chips Ice chips: Within functional limits Presentation: Spoon   Thin Liquid Thin Liquid: Within functional limits Presentation: Cup;Self Fed;Straw    Nectar Thick Nectar Thick Liquid: Not tested   Honey Thick Honey Thick Liquid: Not tested   Puree Puree: Within functional limits Presentation: Spoon   Solid   GO   Solid: Impaired Presentation: Self Fed Oral Phase Impairments: Reduced lingual movement/coordination Oral Phase Functional Implications: Prolonged oral transit;Oral residue    Functional Assessment Tool Used: clinical judgment Functional Limitations: Swallowing Swallow Current Status ([id number]): At least 1 percent but less than 20 percent impaired, limited or restricted Swallow Goal Status [phone]): At least 1 percent but less than 20 percent impaired, limited or restricted Swallow Discharge Status [phone]): At least 1 percent but less than 20 percent impaired, limited or restricted   Thank you,  Havery Moros, CCC-SLP [phone]  Magdiel Bartles 11/17/2015,1:48 PM

## 2015-11-17 NOTE — Clinical Social Work Note (Signed)
Clinical Social Work Assessment  Patient Details  Name: Sarah Skinner MRN: [id number] Date of Birth: January 04, 1941  Date of referral:  11/17/15               Reason for consult:  Discharge Planning                Permission sought to share information with:  Oceanographer granted to share information::  Yes, Verbal Permission Granted  Name::        Agency::  Sarah Skinner  Relationship::  facility  Contact Information:     Housing/Transportation Living arrangements for the past 2 months:  Skilled Building surveyor of Information:  Adult Children Patient Interpreter Needed:  None Criminal Activity/Legal Involvement Pertinent to Current Situation/Hospitalization:  No - Comment as needed Significant Relationships:  Adult Children, Spouse Lives with:  Facility Resident Do you feel safe going back to the place where you live?  Yes Need for family participation in patient care:  Yes (Comment)  Care giving concerns:  None reported. Pt is long term resident at [facility].    Social Worker assessment / plan:  CSW spoke with pt's daughter, Sarah Skinner on phone as pt is disoriented per report this morning. Sarah Skinner indicates she is pt's HCPOA. Family appears to be involved and supportive and visit pt regularly. She has been a resident at [facility] since 2015. Per Sarah Skinner at facility, pt is nursing level of care and okay to return. She requires one person assist with ADLs and uses wheelchair.   Employment status:  Retired Database administrator PT Recommendations:  Not assessed at this time Information / Referral to community resources:  Other (Comment Required) (Return to [facility])  Patient/Family's Response to care:  Pt's daughter agreeable to return to [facility] when medically stable.   Patient/Family's Understanding of and Emotional Response to Diagnosis, Current Treatment, and Prognosis:  Pt's daughter is aware of admission  diagnosis and treatment plan. She states she will be at hospital later today.   Emotional Assessment Appearance:  Appears stated age Attitude/Demeanor/Rapport:  Unable to Assess Affect (typically observed):  Unable to Assess Orientation:  Oriented to Self Alcohol / Substance use:  Not Applicable Psych involvement (Current and /or in the community):  No (Comment)  Discharge Needs  Concerns to be addressed:  Discharge Planning Concerns Readmission within the last 30 days:  No Current discharge risk:  Cognitively Impaired Barriers to Discharge:  Continued Medical Work up   Sarah Cassis, LCSW 11/17/2015, 10:49 AM [phone]

## 2015-11-17 NOTE — Progress Notes (Signed)
Attempted to call report to [facility] x 2.

## 2015-11-17 NOTE — Progress Notes (Signed)
Pt IV and telemetry removed, tolerated well. 

## 2015-11-18 LAB — HEMOGLOBIN A1C
HEMOGLOBIN A1C: 6.4 % — AB (ref 4.8–5.6)
Hgb A1c MFr Bld: 6.5 % — ABNORMAL HIGH (ref 4.8–5.6)
MEAN PLASMA GLUCOSE: 137 mg/dL
MEAN PLASMA GLUCOSE: 140 mg/dL

## 2015-11-19 LAB — URINE CULTURE: Culture: >=100000 — AB

## 2016-02-18 ENCOUNTER — Encounter: Payer: Self-pay | Admitting: Neurology

## 2016-02-18 ENCOUNTER — Ambulatory Visit (INDEPENDENT_AMBULATORY_CARE_PROVIDER_SITE_OTHER): Payer: Medicare Other | Admitting: Neurology

## 2016-02-18 VITALS — BP 146/82 | HR 60 | Ht 62.0 in

## 2016-02-18 DIAGNOSIS — R4781 Slurred speech: Secondary | ICD-10-CM

## 2016-02-18 DIAGNOSIS — Z8673 Personal history of transient ischemic attack (TIA), and cerebral infarction without residual deficits: Secondary | ICD-10-CM | POA: Diagnosis not present

## 2016-02-18 NOTE — Progress Notes (Signed)
NEUROLOGY CONSULTATION NOTE  Sarah Skinner MRN: [id number] DOB: [birth year]  Referring provider: Dr. Daphine Deutscher Primary care provider: Dr. Colon Branch  Reason for consult:  "seizures"  Dear Dr Daphine Deutscher:  Thank you for your kind referral of Sarah Skinner for consultation of the above symptoms. Although her history is well known to you, please allow me to reiterate it for the purpose of our medical record. The patient was accompanied to the clinic by her daughter who also provides collateral information. Records and images were personally reviewed where available.  HISTORY OF PRESENT ILLNESS: This is a 75 year old right-handed woman with a history of vascular dementia, hypertension, diabetes, stroke with residual left-sided weakness, presenting for evaluation of possible seizures. Her daughter reports that for the past couple of months, she would have episodes where she would start slurring her speech and become shaky/twitching. These would last for a whole day, occurring around once a month. Her daughter had been giving her Xanax, and reports she would have these episodes when she did not get her Xanax. The last episode of slurred speech lasted for a day or so, she could not understand what her mother was saying. It was difficult to say if she was confused. No convulsive activity seen. Her arms would be shaky, right>left, legs are unaffected. Her daughter states that maybe it affects her face a little. She would be able to follow commands but sometimes does not know what she is trying to say. She occasionally notices "no energy" in her right hand where she cannot write, lasting an hour or so. She was brought to [facility] ER on 924/17 for slurred speech with elevated BP up to 210/100. She was given clonidine and returned to baseline. She had an MRI/MRA brain without contrast which I personally reviewed, there was a remote right basal ganglia/corona radiata infarct with subsequent dilation of the right  lateral ventricle, remote small right thalamic infarct, cystic encephalomalacia in the anterior left temporal lobe which may reflect result of prior trauma/insult. There was moderate global atrophy and chronic microvascular disease. MRA head showed intracranial atherosclerosis, with marked narrowing of the right MCA M1 segment/bifurcation, mild to moderate branch vessel narrowing and irregularity of bilateral MCAs, marked narrowing of proximal and distal A1 segment right ACA, moderate to marked narrowing A2 segment ACA bilaterally, marked narrowing PCA bilaterally greater on the left.  She has a history of stroke in 2010 with her left side unaffected and drawing up, with pain on movement. She was diagnosed with dementia a year ago. She denies any olfactory/gustatory hallucinations, focal numbness/tingling, myoclonic jerks. She occasionally feels groggy. She has had frontal headaches for many years, aspirin helps. She has baseline diplopia, no dizziness, difficulty swallowing, occasional low back pain and is wheelchair-bound. Her mother and sister had strokes, otherwise she had a normal birth and early development, no history of CNS infections, significant TBI, neurosurgical procedures, no family history of seizures.    Laboratory Data: Lab Results  Component Value Date   WBC 10.3 11/16/2015   HGB 13.5 11/16/2015   HCT 39.7 11/16/2015   MCV 89.4 11/16/2015   PLT 243 11/16/2015     Chemistry      Component Value Date/Time   NA 132 (L) 11/16/2015 2035   NA 142 02/27/2013 1329   K 3.5 11/16/2015 2035   CL 98 (L) 11/16/2015 2035   CO2 27 11/16/2015 2035   BUN 10 11/16/2015 2035   BUN 11 02/27/2013 1329   CREATININE 0.61  11/16/2015 2035   CREATININE 0.75 05/23/2012 1547      Component Value Date/Time   CALCIUM 9.2 11/16/2015 2035   ALKPHOS 92 02/27/2013 1329   AST 41 (H) 02/27/2013 1329   ALT 71 (H) 02/27/2013 1329   BILITOT 0.6 02/27/2013 1329      PAST MEDICAL HISTORY: Past Medical  History:  Diagnosis Date  . Anginal pain (HCC)    not currently seeing cardiologist  . Anxiety   . Arthritis    hands, feet , knees   . Blood transfusion    hx of with childbirth   . Dementia   . Diabetes mellitus   . Fibromyalgia   . GERD (gastroesophageal reflux disease)   . Headache(784.0)    hx of   . Hypertension   . Hypothyroidism   . Stress incontinence    stress incontinence   . Stroke [facility])    left sided weakness     PAST SURGICAL HISTORY: Past Surgical History:  Procedure Laterality Date  . ABDOMINAL HYSTERECTOMY    . CERVICAL FUSION    . CHOLECYSTECTOMY    . CYSTOSCOPY WITH INJECTION  05/18/2011   Procedure: CYSTOSCOPY WITH INJECTION;  Surgeon: Anner Crete, MD;  Location: [facility];  Service: Urology;  Laterality: N/A;  Injection of Macroplastique  . FLEXIBLE SIGMOIDOSCOPY  08/18/2011   Procedure: FLEXIBLE SIGMOIDOSCOPY;  Surgeon: Charna Elizabeth, MD;  [facility];  Service: Endoscopy;  Laterality: N/A;  . RECTOCELE REPAIR    . TUBAL LIGATION    . WISDOM TOOTH EXTRACTION      MEDICATIONS: Current Outpatient Prescriptions on File Prior to Visit  Medication Sig Dispense Refill  . ALPRAZolam (XANAX) 0.25 MG tablet Take 0.25 mg by mouth 2 (two) times daily as needed for anxiety.    Marland Kitchen amLODipine (NORVASC) 5 MG tablet Take 5 mg by mouth daily.     . cetirizine (ZYRTEC) 10 MG tablet Take 10 mg by mouth daily.    . diclofenac (FLECTOR) 1.3 % PTCH Place 1 patch onto the skin 2 (two) times daily. Pt applies to right knee.    . diclofenac (VOLTAREN) 50 MG EC tablet Take 1 tablet (50 mg total) by mouth 2 (two) times daily. 60 tablet 2  . docusate sodium (COLACE) 100 MG capsule Take 200 mg by mouth 2 (two) times daily.    Marland Kitchen gabapentin (NEURONTIN) 400 MG capsule Take 400 mg by mouth 2 (two) times daily.    Marland Kitchen HYDROcodone-acetaminophen (NORCO/VICODIN) 5-325 MG tablet Take 1 tablet by mouth every 12 (twelve) hours.    . insulin glargine (LANTUS) 100 UNIT/ML injection  Inject 23 Units into the skin at bedtime.     Marland Kitchen ipratropium-albuterol (DUONEB) 0.5-2.5 (3) MG/3ML SOLN Take 3 mLs by nebulization every 4 (four) hours as needed (for shortness of breath/wheezing).    . Lactobacillus-Inulin (CULTURELLE DIGESTIVE HEALTH) CAPS Take 1 capsule by mouth daily.    Marland Kitchen levothyroxine (SYNTHROID, LEVOTHROID) 50 MCG tablet Take 50 mcg by mouth daily before breakfast.    . lisinopril (PRINIVIL,ZESTRIL) 5 MG tablet Take 5 mg by mouth daily.    . metoprolol (LOPRESSOR) 50 MG tablet Take 1 tablet (50 mg total) by mouth 2 (two) times daily. 60 tablet 0  . mirabegron ER (MYRBETRIQ) 25 MG TB24 Take 25 mg by mouth daily.    . Multiple Vitamin (MULTIVITAMIN WITH MINERALS) TABS tablet Take 1 tablet by mouth daily.    . Multiple Vitamins-Minerals (OCUVITE EXTRA) TABS Take 1 tablet by mouth  2 (two) times daily.    Marland Kitchen omega-3 acid ethyl esters (LOVAZA) 1 g capsule Take 1 g by mouth 4 (four) times daily.    . pantoprazole (PROTONIX) 40 MG tablet Take 40 mg by mouth daily before breakfast.    . polyvinyl alcohol (LIQUIFILM TEARS) 1.4 % ophthalmic solution Place 1 drop into the left eye 4 (four) times daily.    . potassium chloride SA (K-DUR,KLOR-CON) 20 MEQ tablet Take 1 tablet (20 mEq total) by mouth daily. 30 tablet 11  . promethazine (PHENERGAN) 25 MG/ML injection Inject 25 mg into the muscle every 2 (two) hours as needed for nausea or vomiting.    . rosuvastatin (CRESTOR) 10 MG tablet Take 10 mg by mouth at bedtime.     . senna (SENOKOT) 8.6 MG TABS tablet Take 2 tablets by mouth every other day.    . sertraline (ZOLOFT) 100 MG tablet Take 100 mg by mouth daily. Pt takes with a 25mg  tablet.    . sertraline (ZOLOFT) 25 MG tablet Take 25 mg by mouth daily. Pt takes with a 100mg  tablet.    . Travoprost, BAK Free, (TRAVATAN Z) 0.004 % SOLN ophthalmic solution Place 1 drop into both eyes at bedtime.    . Vitamin D, Ergocalciferol, (DRISDOL) 50000 units CAPS capsule Take 50,000 Units by mouth  every 30 (thirty) days. Pt takes on the 25th.    . [DISCONTINUED] fexofenadine (ALLEGRA) 180 MG tablet Take 180 mg by mouth every morning.      No current facility-administered medications on file prior to visit.     ALLERGIES: Allergies  Allergen Reactions  . Prilosec [Omeprazole] Other (See Comments)    Reaction:  Unknown   . Aspirin Rash  . Latex Rash  . Penicillins Rash and Other (See Comments)    Has patient had a PCN reaction causing immediate rash, facial/tongue/throat swelling, SOB or lightheadedness with hypotension: Unsure Has patient had a PCN reaction causing severe rash involving mucus membranes or skin necrosis: Unsure Has patient had a PCN reaction that required hospitalization Unsure Has patient had a PCN reaction occurring within the last 10 years: Unsure If all of the above answers are "NO", then may proceed with Cephalosporin use.  . Sulfa Antibiotics Rash    FAMILY HISTORY: Family History  Problem Relation Age of Onset  . Heart failure Mother   . COPD Father   . Diabetes Sister   . Heart failure Sister     SOCIAL HISTORY: Social History   Social History  . Marital status: Married    Spouse name:  . Number of children: 6  . Years of education: 45   Occupational History  . Retired [employer]    Social History Main Topics  . Smoking status: Former Smoker    Packs/day: 2.00    Years: 20.00    Types: Cigarettes    Quit date: 02/23/1991  . Smokeless tobacco: Never Used     Comment: Quite 20 Yrs Ago  . Alcohol use No     Comment: hx of no longer uses   . Drug use: No  . Sexual activity: No   Other Topics Concern  . Not on file   Social History Narrative   Married.  Lives in [place] with her husband.  Wheelchair bound.  Transfers with assistance.  Non-ambulatory.    REVIEW OF SYSTEMS: Constitutional: No fevers, chills, or sweats, no generalized fatigue, change in appetite Eyes: No visual changes, double vision, eye pain Ear, nose  and throat: No hearing loss, ear pain, nasal congestion, sore throat Cardiovascular: No chest pain, palpitations Respiratory:  No shortness of breath at rest or with exertion, wheezes GastrointestinaI: No nausea, vomiting, diarrhea, abdominal pain, fecal incontinence Genitourinary:  No dysuria, urinary retention or frequency Musculoskeletal:  No neck pain, back pain Integumentary: No rash, pruritus, skin lesions Neurological: as above Psychiatric: No depression, insomnia, anxiety Endocrine: No palpitations, fatigue, diaphoresis, mood swings, change in appetite, change in weight, increased thirst Hematologic/Lymphatic:  No anemia, purpura, petechiae. Allergic/Immunologic: no itchy/runny eyes, nasal congestion, recent allergic reactions, rashes  PHYSICAL EXAM: Vitals:   02/18/16 1404  BP: (!) 146/82  Pulse: 60   General: No acute distress, sitting on wheelchair Head:  Normocephalic/atraumatic Eyes: Fundoscopic exam shows bilateral sharp discs, no vessel changes, exudates, or hemorrhages Neck: supple, no paraspinal tenderness, full range of motion Back: No paraspinal tenderness Heart: regular rate and rhythm Lungs: Clear to auscultation bilaterally. Vascular: No carotid bruits. Skin/Extremities: No rash, no edema Neurological Exam: Mental status: states her age is "9," alert and oriented to person, place,states it is the 2nd of July, then when asked the year states it is October (it is 02/18/2016). No dysarthria or aphasia, Fund of knowledge is reduced.  Recent and remote memory are impaired.  Attention and concentration are normal.    Able to name objects and repeat phrases. Cranial nerves: CN I: not tested CN II: pupils equal, round and reactive to light, visual fields intact, fundi unremarkable. CN III, IV, VI:  full range of motion, no nystagmus, no ptosis CN V: facial sensation intact CN VII: upper and lower face symmetric CN VIII: hearing intact to finger rub CN IX, X: gag  intact, uvula midline CN XI: sternocleidomastoid and trapezius muscles intact CN XII: tongue midline Bulk & Tone: increased on the left UE with spastic contracture of left UE,  Motor: 2/5 on left UE and LE, 5/5 on right UE and LE Sensation: decreased on left UE and LE.  Deep Tendon Reflexes: brisk +3 on left UE, otherwise +2 throughout, no ankle clonus Plantar responses: downgoing bilaterally Cerebellar: no incoordination on finger to nose on right Gait: not tested Tremor: none  IMPRESSION: This is a 75 year old right-handed woman with a history of f vascular dementia, hypertension, diabetes, stroke with residual left-sided weakness, presenting for evaluation of possible seizures where she would have recurrent episodes of slurred speech lasting up to a whole day. She is noted to have shakiness, but not true convulsive activity. During a visit to the ER for slurred speech, her BP was 210/100, symptoms improved with clonidine. Although changes on her MRI (left temporal encephalomalacia) can potentially be a seizure focus, the episodes are not clearly suggestive of seizures. A 1-hour EEG will be ordered to assess for focal abnormalities that increase risk for recurrent seizures. Other consideration is exacerbation/"recrudescence" of symptoms from a previous stroke that can occur with systemic infection, dehydration, metabolic derangements, elevated BP. She does have significant intracranial atherosclerosis which can predispose her to TIAs, maximum medical management is indicated, BP today 146/82, continue control of BP, cholesterol. She should be on an antiplatelet agent such as aspirin daily. Her daughter knows to bring her to ER for any change in symptoms. She will follow-up in 6 months.   Thank you for allowing me to participate in the care of this patient. Please do not hesitate to call for any questions or concerns.   Patrcia Dolly, M.D.  CC: Dr. Virgina Organ

## 2016-02-18 NOTE — Patient Instructions (Signed)
1. Schedule 1-hour EEG 2. Follow-up in 6 months, call for any changes

## 2016-03-22 ENCOUNTER — Other Ambulatory Visit: Payer: Self-pay

## 2016-03-22 ENCOUNTER — Ambulatory Visit (INDEPENDENT_AMBULATORY_CARE_PROVIDER_SITE_OTHER): Payer: Medicare Other | Admitting: Neurology

## 2016-03-22 DIAGNOSIS — Z8673 Personal history of transient ischemic attack (TIA), and cerebral infarction without residual deficits: Secondary | ICD-10-CM

## 2016-03-22 DIAGNOSIS — R4781 Slurred speech: Secondary | ICD-10-CM | POA: Diagnosis not present

## 2016-03-30 NOTE — Procedures (Signed)
ELECTROENCEPHALOGRAM REPORT  Date of Study: 03/22/2016  Patient's Name: Sarah Skinner MRN: [id number] Date of Birth: 01-24-1941  Referring Provider: Dr. Patrcia Dolly  Clinical History: This is a 76 year old woman with recurrent episodes of slurred speech and shakiness.  Medications: Neurontin, Xanax, Norvasc, Zyrtec, Flector, Voltaren, Colace, Norco, Lantus, Duoneb, Synthroid, Prinivil, Lopressor, Myrbetriq, Lovaza, Protonix, Phenergan, Crestor, Zoloft  Technical Summary: A multichannel digital 1-hour EEG recording measured by the international 10-20 system with electrodes applied with paste and impedances below 5000 ohms performed as portable with EKG monitoring in an awake and asleep patient.  Hyperventilation was not performed. Photic stimulation was performed.  The digital EEG was referentially recorded, reformatted, and digitally filtered in a variety of bipolar and referential montages for optimal display.   Description: The patient is awake and asleep during the recording.  During maximal wakefulness, there is a symmetric, medium voltage 7.5 Hz posterior dominant rhythm that attenuates with eye opening. This is admixed with a small amount of diffuse 4-5 Hz theta and 2-3 Hz delta slowing seen with shifting asymmetry over the bilateral temporal regions. During drowsiness and sleep, there is an increase in theta and delta slowing of the background. Deeper stages of sleep were not seen. Photic stimulation did not elicit any abnormalities.  There were no epileptiform discharges or electrographic seizures seen.    EKG lead was unremarkable.  Impression: This 1-hour  awake and asleep EEG is abnormal due to mild diffuse slowing of the waking background.  Clinical Correlation of the above findings indicates diffuse cerebral dysfunction that is non-specific in etiology and can be seen with hypoxic/ischemic injury, toxic/metabolic encephalopathies, neurodegenerative disorders, or medication  effect.  The absence of epileptiform discharges does not rule out a clinical diagnosis of epilepsy.  Clinical correlation is advised.   Patrcia Dolly, M.D.

## 2016-03-31 NOTE — Progress Notes (Signed)
LVM 2/7

## 2016-04-01 ENCOUNTER — Telehealth: Payer: Self-pay

## 2016-04-01 NOTE — Telephone Encounter (Signed)
-----   Message from Van Clines, MD sent at 03/30/2016  4:28 PM EST ----- Pls let daughter know the EEG did not show any seizure discharges. Thanks

## 2016-04-01 NOTE — Telephone Encounter (Signed)
Patient's daughter notified.

## 2016-04-13 ENCOUNTER — Ambulatory Visit: Payer: Self-pay | Admitting: Neurology

## 2016-08-16 ENCOUNTER — Ambulatory Visit: Payer: Self-pay | Admitting: Neurology

## 2017-10-12 IMAGING — MR MR HEAD W/O CM
7 series · 41 of 48 positions shown · non-contrast
Comparison: 11/16/2015 head CT. 10/21/2008 brain MR and MR
angiogram.

ADDENDUM:
Spinal stenosis upper cervical spinal with cord flattening C2-3 and
C3-4 incompletely assessed on present exam secondary to motion.
CLINICAL DATA: 75-year-old diabetic hypertensive female presenting
with slurred speech yesterday. Subsequent encounter.

EXAM:
MRI HEAD WITHOUT CONTRAST
MRA HEAD WITHOUT CONTRAST
TECHNIQUE: Multiplanar, multiecho pulse sequences of the brain and surrounding
structures were obtained without intravenous contrast. Angiographic
images of the head were obtained using MRA technique without
contrast.

[Series 4: DWI · axial · 3.0mm · 0.77mm/px · z∈[-35,+120]mm · 8 of 54 slices shown (1 of 4)]
[im 1/54]
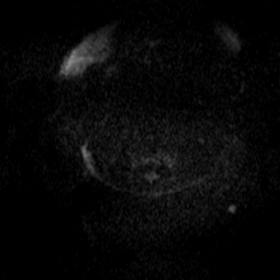
[im 6/54]
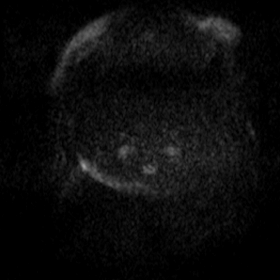
[im 18/54]
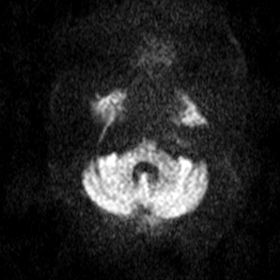
[im 24/54]
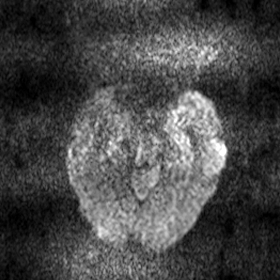
[im 30/54]
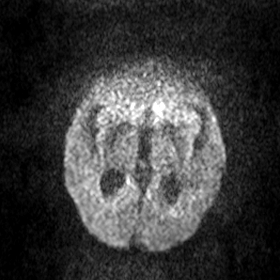
[im 36/54]
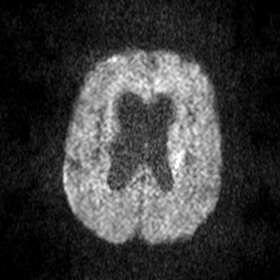
[im 48/54]
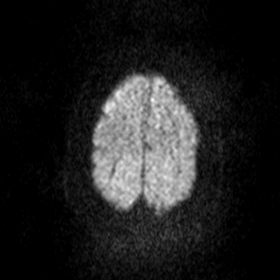
[im 54/54]
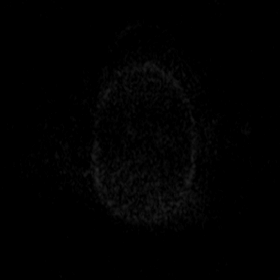

[Series 5: DWI · axial · 3.0mm · 0.78mm/px · z∈[-38,+120]mm · 8 of 55 slices shown (2 of 4)]
[im 1/55]
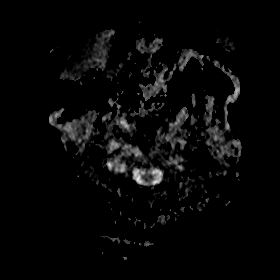
[im 7/55]
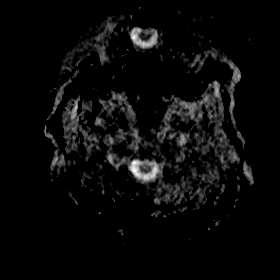
[im 19/55]
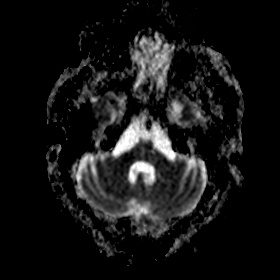
[im 25/55]
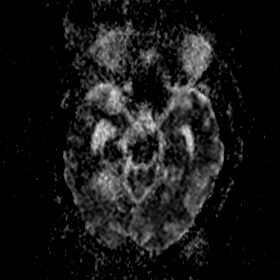
[im 31/55]
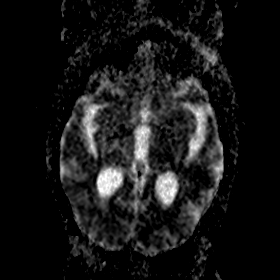
[im 37/55]
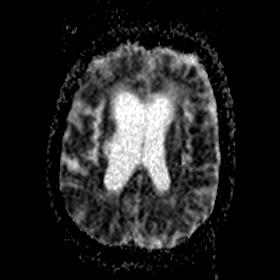
[im 49/55]
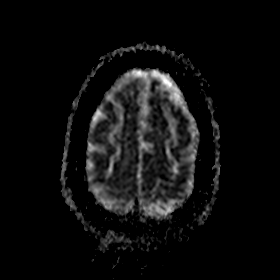
[im 55/55]
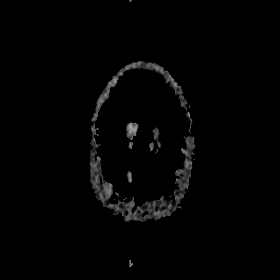

[Series 6: DWI · coronal · 5.0mm · 0.55mm/px · 7 of 34 slices shown (3 of 4)]
[im 1/34]
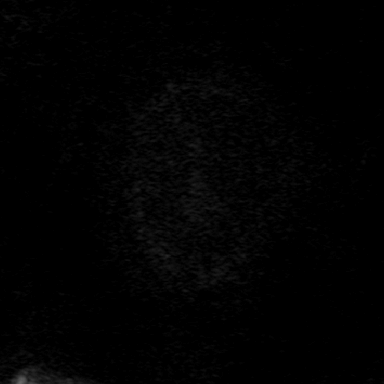
[im 6/34]
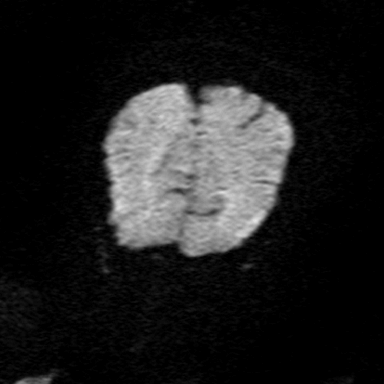
[im 12/34]
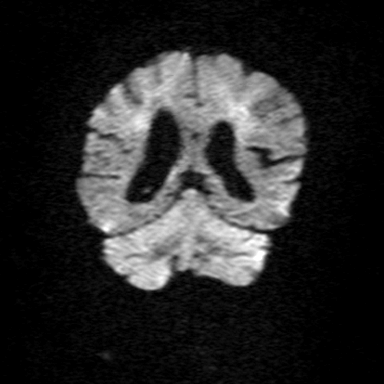
[im 17/34]
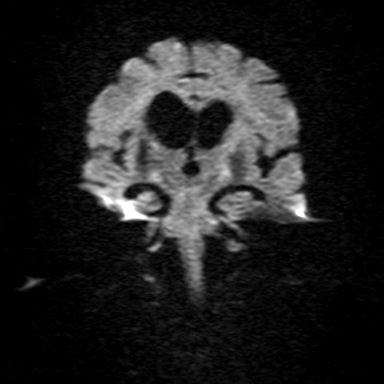
[im 23/34]
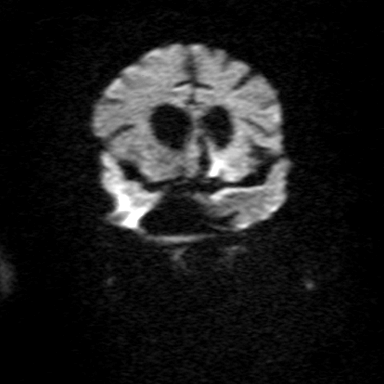
[im 28/34]
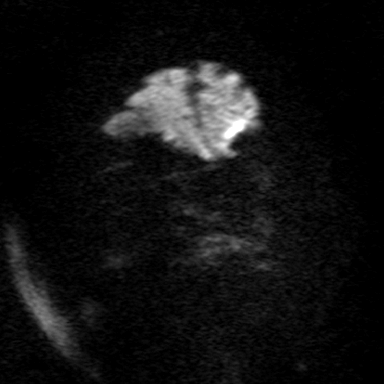
[im 34/34]
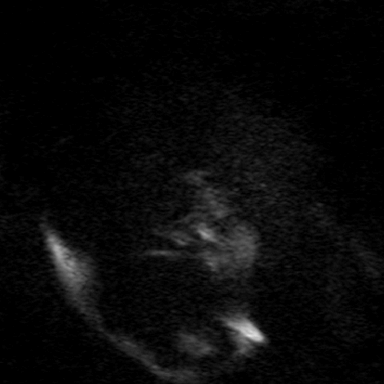

[Series 7: DWI · coronal · 5.0mm · 0.57mm/px · 7 of 34 slices shown (4 of 4)]
[im 1/34]
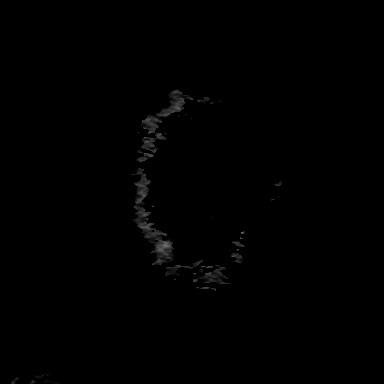
[im 6/34]
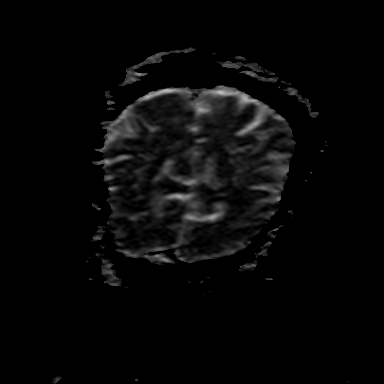
[im 12/34]
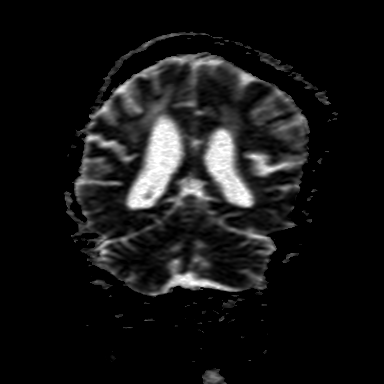
[im 17/34]
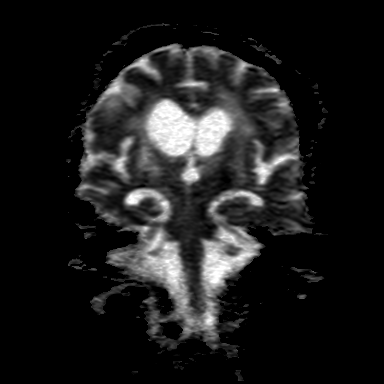
[im 23/34]
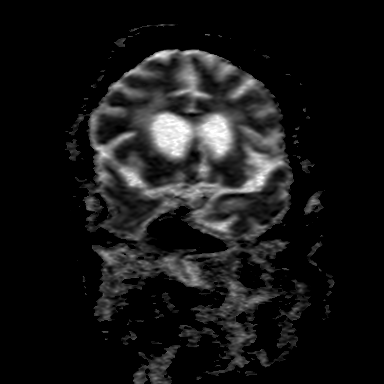
[im 28/34]
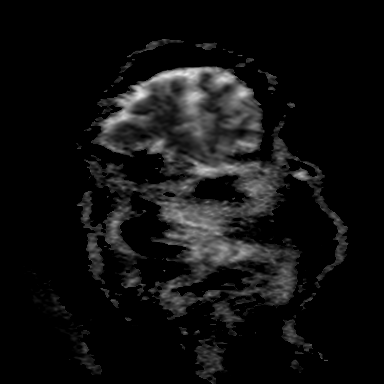
[im 34/34]
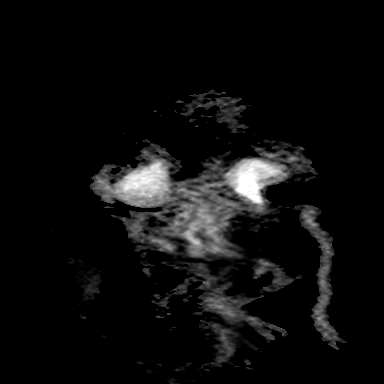

[Series 8: t1_fl2d_sag · sagittal · 5.0mm · 0.42mm/px · 1 of 20 slices shown]
[im 1/20]
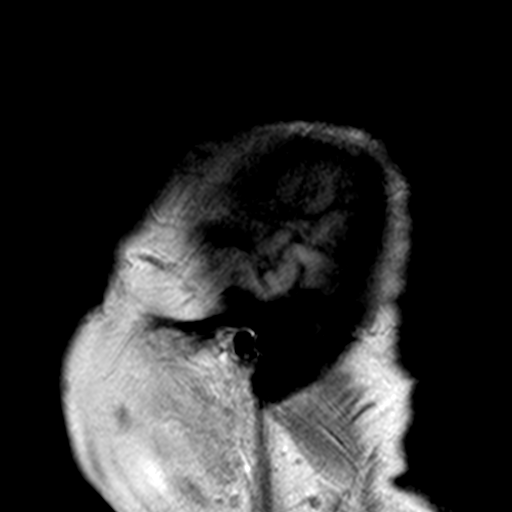

[Series 9: T2 · axial · 5.0mm · 0.67mm/px · z∈[-30,+110]mm · 5 of 23 slices shown]
[im 1/23]
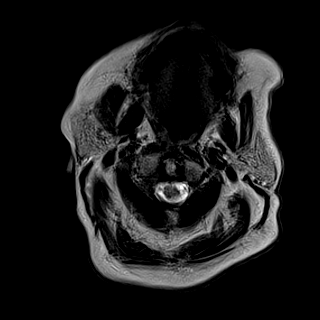
[im 6/23]
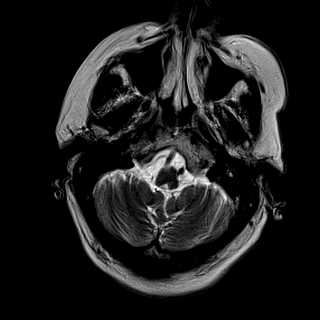
[im 12/23]
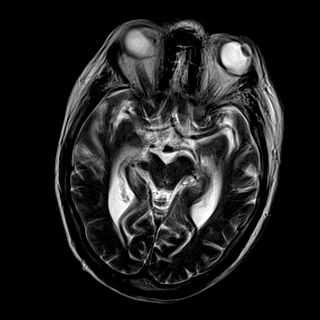
[im 17/23]
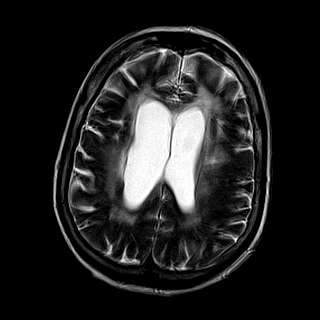
[im 23/23]
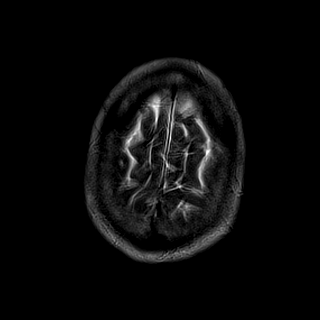

[Series 10: FLAIR · axial · 5.0mm · 0.81mm/px · z∈[-30,+109]mm · 5 of 23 slices shown]
[im 1/23]
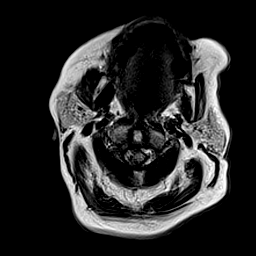
[im 6/23]
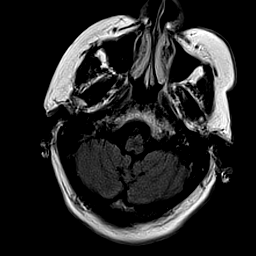
[im 12/23]
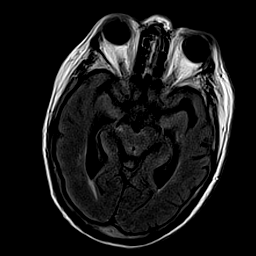
[im 17/23]
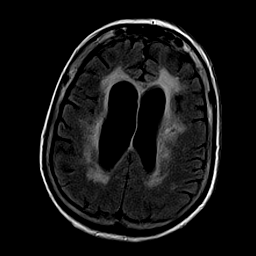
[im 23/23]
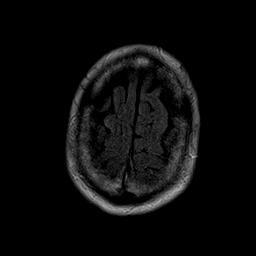

[41 of 48 positions shown; findings below may reference images not displayed]

FINDINGS: Exam is motion degraded. Patient was only able to complete seven
sequences.

MRI HEAD FINDINGS

Brain: No acute infarct or intracranial hemorrhage.

Remote posterior right basal ganglia/ corona radiata infarct with
subsequent dilation of the right lateral ventricle. Wallerian
degeneration.

Remote small right thalamic infarct.

Moderate-to-marked chronic microvascular changes.

Moderate global atrophy. Ventricular prominence felt to be related
to atrophy rather than hydrocephalus.

Cystic encephalomalacia anterior left temporal lobe may reflect
result of prior trauma/insult and I suspect without change
(previously best appreciated on series 10 coronal T2 imaging). If
there were progressive clinical changes, this could be followed up
with MR imaging to exclude the much less likely consideration of
underlying mass.

No intracranial mass lesion noted on this unenhanced exam.

Vascular: As below

Skull and upper cervical spine: Spinal stenosis upper cervical spine
with cord flattening C2-3 and C3-4 level incompletely assessed.

Sinuses/Orbits: No acute orbital abnormality. Partial opacification
inferior right maxillary sinus. Mild mucosal thickening ethmoid
sinus air cells.

Other: Negative.

MRA HEAD FINDINGS

Marked narrowing right middle cerebral artery M1 segment/
bifurcation.

Middle cerebral artery mild to moderate branch vessel narrowing and
irregularity bilaterally.

Marked narrowing proximal and distal A1 segment right anterior
cerebral artery.

Moderate to marked narrowing A2 segment anterior cerebral artery
bilaterally.

Mild to moderate narrowing versus artifact petrous and pre cavernous
segment internal carotid artery bilaterally.

Mild to moderate narrowing distal vertebral artery more notable on
the right.

Nonvisualized posterior inferior cerebellar artery and anterior
inferior cerebellar artery bilaterally.

Marked narrowing posterior cerebral artery bilaterally greater on
the left.
IMPRESSION: Exam is motion degraded and patient was not able to complete all
sequences.

No acute infarct.

Remote infarcts and prominent chronic microvascular changes as
detailed above.

Anterior left temporal lobe abnormality probably related to prior
infarct/insult. Please see above discussion.

Significant intracranial atherosclerotic changes as detailed above.

## 2017-10-12 IMAGING — MR MR MRA HEAD W/O CM
1 series · 15 of 48 positions shown · non-contrast
Comparison: 11/16/2015 head CT. 10/21/2008 brain MR and MR
angiogram.

ADDENDUM:
Spinal stenosis upper cervical spinal with cord flattening C2-3 and
C3-4 incompletely assessed on present exam secondary to motion.
CLINICAL DATA: 75-year-old diabetic hypertensive female presenting
with slurred speech yesterday. Subsequent encounter.

EXAM:
MRI HEAD WITHOUT CONTRAST
MRA HEAD WITHOUT CONTRAST
TECHNIQUE: Multiplanar, multiecho pulse sequences of the brain and surrounding
structures were obtained without intravenous contrast. Angiographic
images of the head were obtained using MRA technique without
contrast.

[Series 1: MRA · axial · 0.8mm · 0.36mm/px · z∈[+2,+97]mm · 15 of 131 slices shown]
[im 1/131]
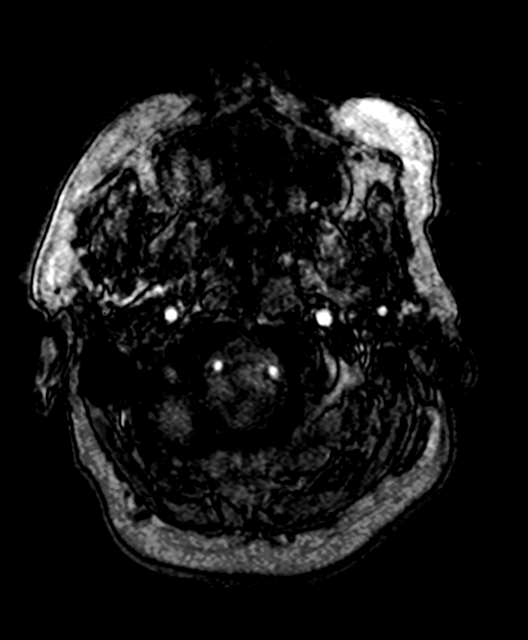
[im 3/131]
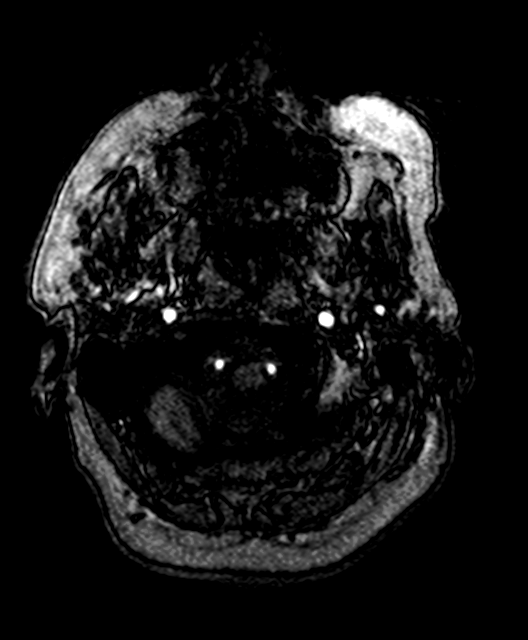
[im 6/131]
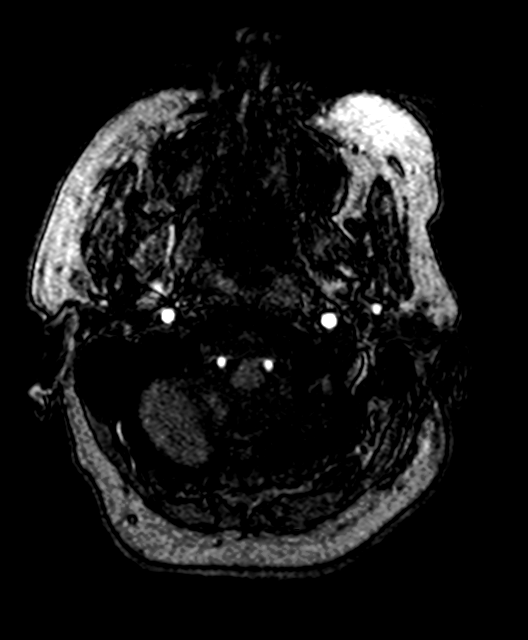
[im 9/131]
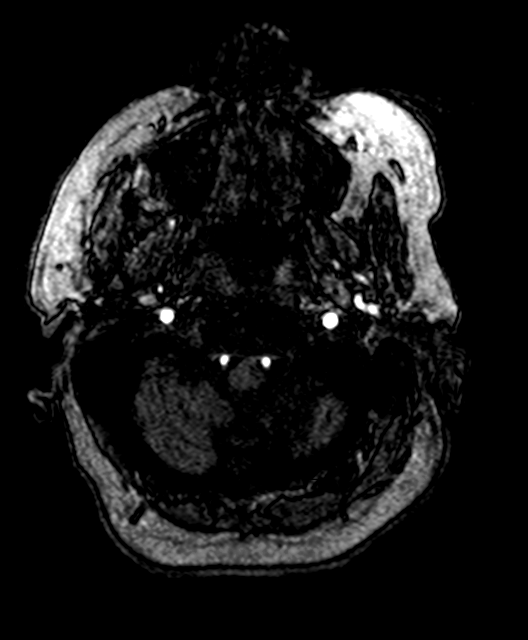
[im 12/131]
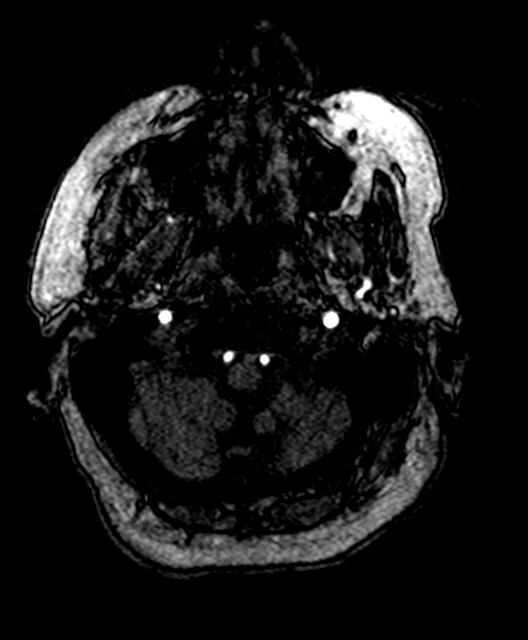
[im 23/131]
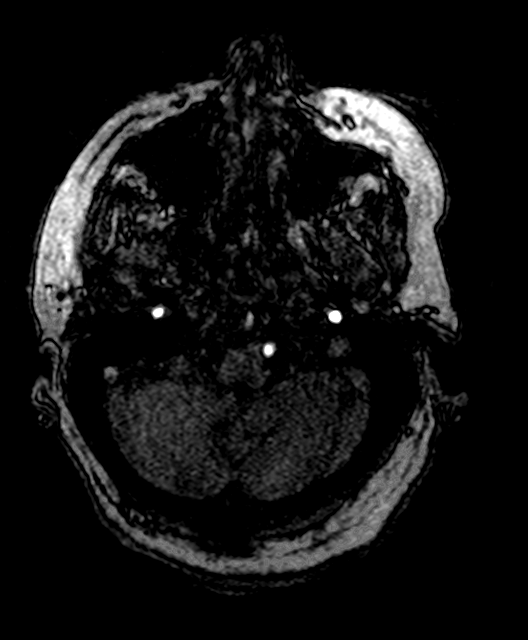
[im 25/131]
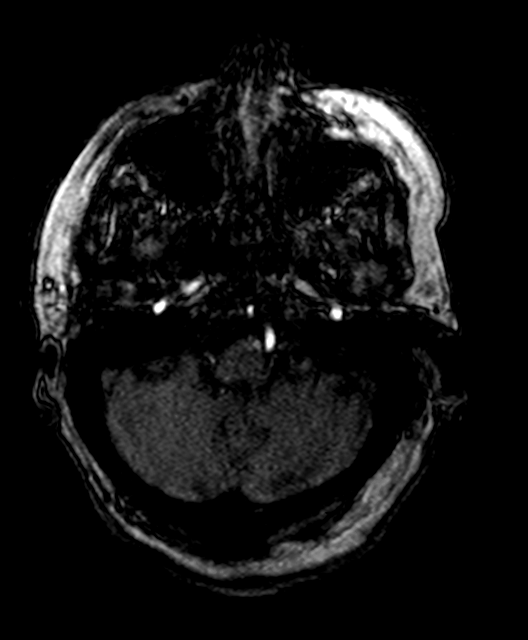
[im 42/131]
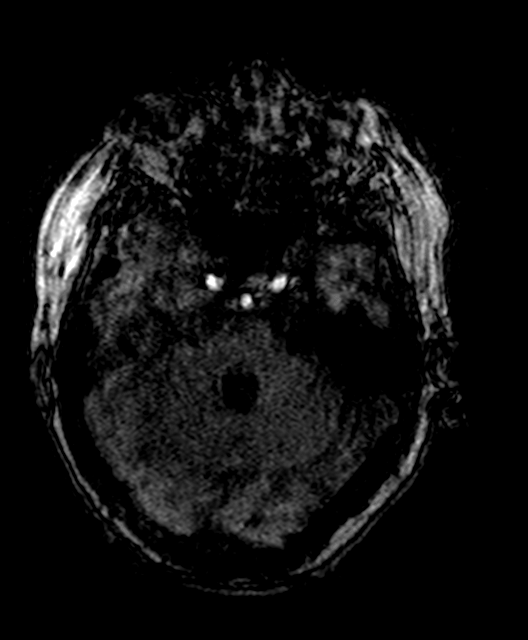
[im 59/131]
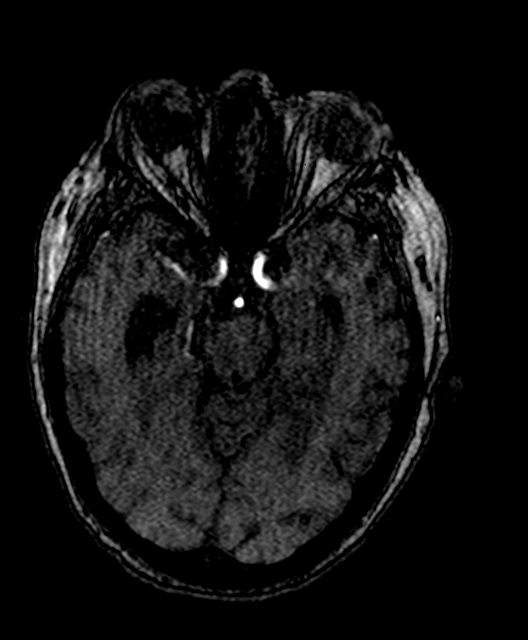
[im 67/131]
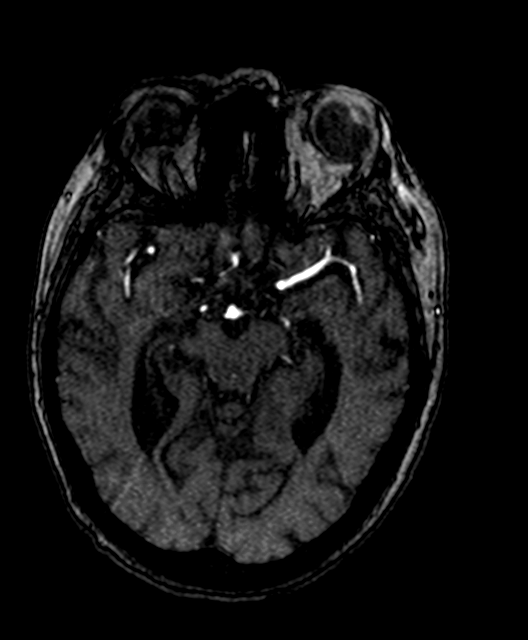
[im 75/131]
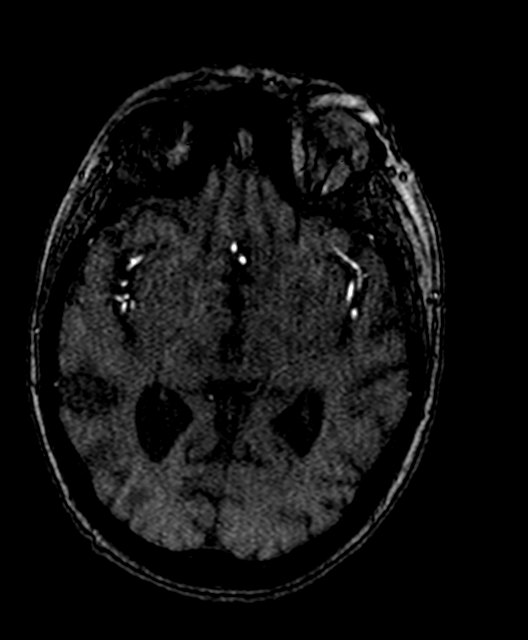
[im 92/131]
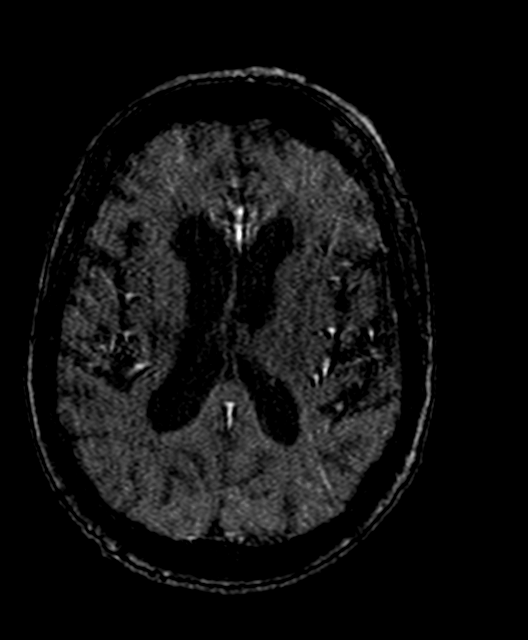
[im 108/131]
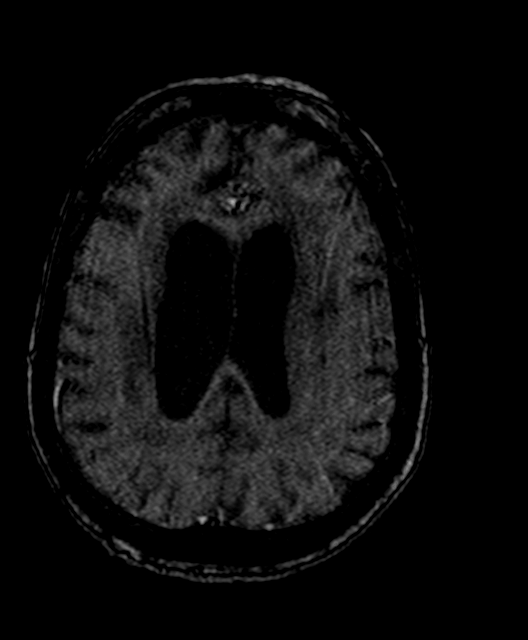
[im 111/131]
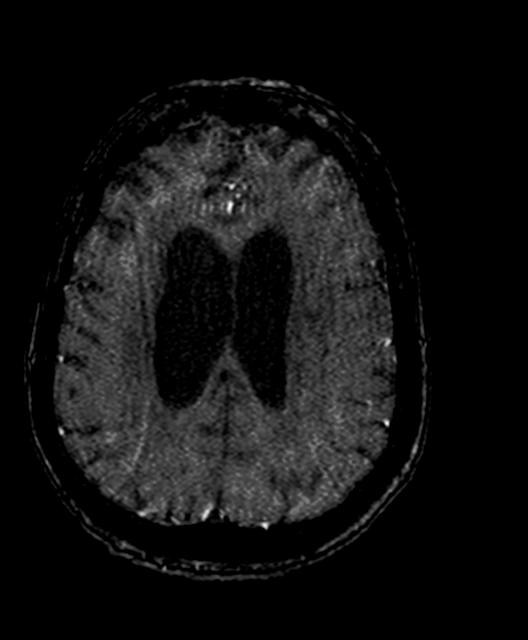
[im 125/131]
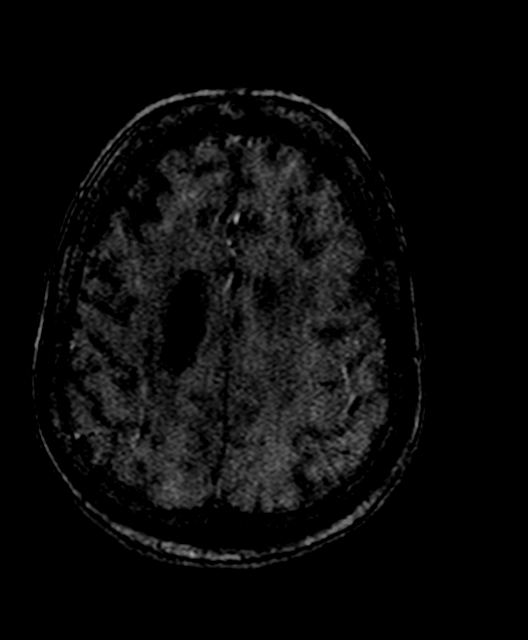

[15 of 48 positions shown; findings below may reference images not displayed]

FINDINGS: Exam is motion degraded. Patient was only able to complete seven
sequences.

MRI HEAD FINDINGS

Brain: No acute infarct or intracranial hemorrhage.

Remote posterior right basal ganglia/ corona radiata infarct with
subsequent dilation of the right lateral ventricle. Wallerian
degeneration.

Remote small right thalamic infarct.

Moderate-to-marked chronic microvascular changes.

Moderate global atrophy. Ventricular prominence felt to be related
to atrophy rather than hydrocephalus.

Cystic encephalomalacia anterior left temporal lobe may reflect
result of prior trauma/insult and I suspect without change
(previously best appreciated on series 10 coronal T2 imaging). If
there were progressive clinical changes, this could be followed up
with MR imaging to exclude the much less likely consideration of
underlying mass.

No intracranial mass lesion noted on this unenhanced exam.

Vascular: As below

Skull and upper cervical spine: Spinal stenosis upper cervical spine
with cord flattening C2-3 and C3-4 level incompletely assessed.

Sinuses/Orbits: No acute orbital abnormality. Partial opacification
inferior right maxillary sinus. Mild mucosal thickening ethmoid
sinus air cells.

Other: Negative.

MRA HEAD FINDINGS

Marked narrowing right middle cerebral artery M1 segment/
bifurcation.

Middle cerebral artery mild to moderate branch vessel narrowing and
irregularity bilaterally.

Marked narrowing proximal and distal A1 segment right anterior
cerebral artery.

Moderate to marked narrowing A2 segment anterior cerebral artery
bilaterally.

Mild to moderate narrowing versus artifact petrous and pre cavernous
segment internal carotid artery bilaterally.

Mild to moderate narrowing distal vertebral artery more notable on
the right.

Nonvisualized posterior inferior cerebellar artery and anterior
inferior cerebellar artery bilaterally.

Marked narrowing posterior cerebral artery bilaterally greater on
the left.
IMPRESSION: Exam is motion degraded and patient was not able to complete all
sequences.

No acute infarct.

Remote infarcts and prominent chronic microvascular changes as
detailed above.

Anterior left temporal lobe abnormality probably related to prior
infarct/insult. Please see above discussion.

Significant intracranial atherosclerotic changes as detailed above.

## 2022-04-19 ENCOUNTER — Emergency Department (HOSPITAL_COMMUNITY): Payer: Medicare Other

## 2022-04-19 ENCOUNTER — Emergency Department (HOSPITAL_COMMUNITY)
Admission: EM | Admit: 2022-04-19 | Discharge: 2022-04-19 | Disposition: A | Discharge status: Skilled Nursing Facility | Payer: Medicare Other | Attending: Emergency Medicine | Admitting: Emergency Medicine

## 2022-04-19 DIAGNOSIS — I69354 Hemiplegia and hemiparesis following cerebral infarction affecting left non-dominant side: Secondary | ICD-10-CM | POA: Diagnosis not present

## 2022-04-19 DIAGNOSIS — Z79899 Other long term (current) drug therapy: Secondary | ICD-10-CM | POA: Diagnosis not present

## 2022-04-19 DIAGNOSIS — Z91040 Latex allergy status: Secondary | ICD-10-CM | POA: Diagnosis not present

## 2022-04-19 DIAGNOSIS — E039 Hypothyroidism, unspecified: Secondary | ICD-10-CM | POA: Diagnosis not present

## 2022-04-19 DIAGNOSIS — H5789 Other specified disorders of eye and adnexa: Secondary | ICD-10-CM | POA: Diagnosis present

## 2022-04-19 DIAGNOSIS — E119 Type 2 diabetes mellitus without complications: Secondary | ICD-10-CM | POA: Insufficient documentation

## 2022-04-19 DIAGNOSIS — Z794 Long term (current) use of insulin: Secondary | ICD-10-CM | POA: Insufficient documentation

## 2022-04-19 DIAGNOSIS — F0390 Unspecified dementia without behavioral disturbance: Secondary | ICD-10-CM | POA: Diagnosis not present

## 2022-04-19 DIAGNOSIS — H1132 Conjunctival hemorrhage, left eye: Secondary | ICD-10-CM | POA: Diagnosis not present

## 2022-04-19 DIAGNOSIS — I10 Essential (primary) hypertension: Secondary | ICD-10-CM | POA: Diagnosis not present

## 2022-04-19 MED ORDER — BRINZOLAMIDE 1 % OP SUSP
1.0000 [drp] | Freq: Once | OPHTHALMIC | Status: AC
  Administered 2022-04-19: 1 [drp] via OPHTHALMIC
  Filled 2022-04-19: qty 10

## 2022-04-19 MED ORDER — TETRACAINE HCL 0.5 % OP SOLN
2.0000 [drp] | Freq: Once | OPHTHALMIC | Status: AC
  Administered 2022-04-19: 2 [drp] via OPHTHALMIC
  Filled 2022-04-19: qty 4

## 2022-04-19 MED ORDER — FLUORESCEIN SODIUM 1 MG OP STRP
1.0000 | ORAL_STRIP | Freq: Once | OPHTHALMIC | Status: AC
  Administered 2022-04-19: 1 via OPHTHALMIC
  Filled 2022-04-19: qty 1

## 2022-04-19 MED ORDER — LATANOPROST 0.005 % OP SOLN
1.0000 [drp] | Freq: Once | OPHTHALMIC | Status: AC
  Administered 2022-04-19: 1 [drp] via OPHTHALMIC
  Filled 2022-04-19: qty 2.5

## 2022-04-19 NOTE — ED Notes (Signed)
Pt off unit with PT[facility] back to facility. No s/s of acute distress noted.

## 2022-04-19 NOTE — ED Provider Notes (Signed)
[facility] Provider Note   CSN: [id number] Arrival date & time: 04/19/22  1154     History  Chief Complaint  Patient presents with   Eye Problem    Sarah Skinner is a 82 y.o. female.  Level 5 caveat for dementia.  Patient sent from her facility with erythema and redness to her left eye noticed this morning.  No known fall or trauma.  Patient states no change in her vision and denies any new blurry vision or double vision.  Denies any eye pain.  It was noted to be red when assessed by nursing staff this morning.  Does have a history of glaucoma.  Compliant with her drops.  Does not take any blood thinners other than aspirin.  Patient denies any headache, eye pain, vision change.  No blurry vision or double vision.  No chest pain or shortness of breath.  No abdominal pain, nausea or vomiting.  Past medical history includes previous stroke with left-sided weakness, nonambulatory, dementia, diabetes, hypertension, glaucoma, hypothyroidism.  The history is provided by the patient and a relative. The history is limited by the condition of the patient.  Eye Problem      Home Medications Prior to Admission medications   Medication Sig Start Date End Date Taking? Authorizing Provider  ALPRAZolam (XANAX) 0.25 MG tablet Take 0.25 mg by mouth 2 (two) times daily as needed for anxiety.    [provider]  amLODipine (NORVASC) 5 MG tablet Take 5 mg by mouth daily.     [provider]  cetirizine (ZYRTEC) 10 MG tablet Take 10 mg by mouth daily.    [provider]  diclofenac (CATAFLAM) 50 MG tablet Take 50 mg by mouth 3 (three) times daily.    [provider]  diclofenac (FLECTOR) 1.3 % PTCH Place 1 patch onto the skin 2 (two) times daily. Pt applies to right knee.    [provider]  diclofenac (VOLTAREN) 50 MG EC tablet Take 1 tablet (50 mg total) by mouth 2 (two) times daily. 02/27/13   Vernie Shanks, MD  docusate sodium (COLACE) 100 MG capsule Take 200 mg by mouth 2 (two) times daily.    Robbie Lis, MD  gabapentin (NEURONTIN) 400 MG capsule Take 400 mg by mouth 2 (two) times daily.    [provider]  HYDROcodone-acetaminophen (NORCO/VICODIN) 5-325 MG tablet Take 1 tablet by mouth every 12 (twelve) hours.    [provider]  insulin glargine (LANTUS) 100 UNIT/ML injection Inject 23 Units into the skin at bedtime.     [provider]  ipratropium-albuterol (DUONEB) 0.5-2.5 (3) MG/3ML SOLN Take 3 mLs by nebulization every 4 (four) hours as needed (for shortness of breath/wheezing).    [provider]  Lactobacillus-Inulin ([place]) CAPS Take 1 capsule by mouth daily.    [provider]  levothyroxine (SYNTHROID, LEVOTHROID) 50 MCG tablet Take 50 mcg by mouth daily before breakfast.    [provider]  lisinopril (PRINIVIL,ZESTRIL) 5 MG tablet Take 5 mg by mouth daily.    [provider]  Magnesium Hydroxide (MILK OF MAGNESIA PO) Take by mouth.    [provider]  metoprolol (LOPRESSOR) 50 MG tablet Take 1 tablet (50 mg total) by mouth 2 (two) times daily. 02/13/13   Vernie Shanks, MD  mirabegron ER (MYRBETRIQ) 25 MG TB24 Take 25 mg by mouth daily.    [provider]  Multiple Vitamin (  MULTIVITAMIN WITH MINERALS) TABS tablet Take 1 tablet by mouth daily.    [provider]  Multiple Vitamins-Minerals (OCUVITE EXTRA) TABS Take 1 tablet by mouth 2 (two) times daily.    [provider]  omega-3 acid ethyl esters (LOVAZA) 1 g capsule Take 1 g by mouth 4 (four) times daily.    [provider]  Omega-3 Fatty Acids (FISH OIL) 1000 MG CAPS Take by mouth.    [provider]  pantoprazole (PROTONIX) 40 MG tablet Take 40 mg by mouth daily before breakfast.    [provider]  polyvinyl alcohol (LIQUIFILM TEARS) 1.4 % ophthalmic solution Place 1 drop into  the left eye 4 (four) times daily.    [provider]  potassium chloride SA (K-DUR,KLOR-CON) 20 MEQ tablet Take 1 tablet (20 mEq total) by mouth daily. 02/27/13   Vernie Shanks, MD  promethazine (PHENERGAN) 25 MG/ML injection Inject 25 mg into the muscle every 2 (two) hours as needed for nausea or vomiting.    [provider]  rosuvastatin (CRESTOR) 10 MG tablet Take 10 mg by mouth at bedtime.     [provider]  senna (SENOKOT) 8.6 MG TABS tablet Take 2 tablets by mouth every other day.    [provider]  sertraline (ZOLOFT) 100 MG tablet Take 100 mg by mouth daily. Pt takes with a '25mg'$  tablet.    [provider]  sertraline (ZOLOFT) 25 MG tablet Take 25 mg by mouth daily. Pt takes with a '100mg'$  tablet.    [provider]  Travoprost, BAK Free, (TRAVATAN Z) 0.004 % SOLN ophthalmic solution Place 1 drop into both eyes at bedtime.    [provider]  Vitamin D, Ergocalciferol, (DRISDOL) 50000 units CAPS capsule Take 50,000 Units by mouth every 30 (thirty) days. Pt takes on the 25th.    [provider]  fexofenadine (ALLEGRA) 180 MG tablet Take 180 mg by mouth every morning.   05/05/11  [provider]      Allergies    Prilosec [omeprazole], Aspirin, Latex, Penicillins, and Sulfa antibiotics    Review of Systems   Review of Systems  Unable to perform ROS: Dementia    Physical Exam Updated Vital Signs BP (!) 141/51   Pulse 68   Temp 97.9 F (36.6 C)   Resp 16   Ht '5\' 3"'$  (1.6 m)   Wt 68 kg   SpO2 97%   BMI 26.57 kg/m  Physical Exam Vitals and nursing note reviewed.  Constitutional:      General: She is not in acute distress.    Appearance: She is well-developed.  HENT:     Head: Normocephalic and atraumatic.     Mouth/Throat:     Pharynx: No oropharyngeal exudate.  Eyes:     General: Lids are normal.     Intraocular pressure: Left eye pressure is 25 mmHg. Measurements were taken using a handheld  tonometer.    Conjunctiva/sclera:     Left eye: Left conjunctiva is injected. Hemorrhage present.     Pupils: Pupils are equal, round, and reactive to light.     Comments: There is diffuse hemorrhage throughout the sclera of the left eye approximately 50% in a horizontal distribution.  No pain with extraocular movements.  No hyphema or hypopyon. Some crusting of lashes. Of  No areas of fluorescein uptake.  Neck:     Comments: No meningismus. Cardiovascular:     Rate and Rhythm: Normal rate and regular rhythm.  Heart sounds: Normal heart sounds. No murmur heard. Pulmonary:     Effort: Pulmonary effort is normal. No respiratory distress.     Breath sounds: Normal breath sounds.  Abdominal:     Palpations: Abdomen is soft.     Tenderness: There is no abdominal tenderness. There is no guarding or rebound.  Musculoskeletal:        General: No tenderness. Normal range of motion.     Cervical back: Normal range of motion and neck supple.  Skin:    General: Skin is warm.  Neurological:     Mental Status: She is alert and oriented to person, place, and time.     Cranial Nerves: No cranial nerve deficit.     Motor: No abnormal muscle tone.     Coordination: Coordination normal.     Comments:  5/5 strength throughout. CN 2-12 intact.Equal grip strength.   Psychiatric:        Behavior: Behavior normal.     ED Results / Procedures / Treatments   Labs (all labs ordered are listed, but only abnormal results are displayed) Labs Reviewed - No data to display  EKG None  Radiology CT OrbitsS W/O CM  Result Date: 04/19/2022 CLINICAL DATA:  Hematoma to left eye.  Redness. EXAM: CT ORBITS WITHOUT CONTRAST TECHNIQUE: Multidetector CT imaging of the orbits was performed using the standard protocol without intravenous contrast. Multiplanar CT image reconstructions were also generated. RADIATION DOSE REDUCTION: This exam was performed according to the departmental dose-optimization program  which includes automated exposure control, adjustment of the mA and/or kV according to patient size and/or use of iterative reconstruction technique. COMPARISON:  MR head 11/17/2015 FINDINGS: Orbits: Right: The globe is intact. The optic nerve is normal. The orbital fat is normal. The extraocular muscles are normal. The lacrimal gland is normal. Left: The globe is intact. The optic nerve appears atrophic compared to the right (10-33). The orbital fat is normal. The extraocular muscles are normal. The lacrimal gland is normal. Visible paranasal sinuses: There is an incompletely imaged retention cyst in the right maxillary sinus. The paranasal sinuses are otherwise clear. The mastoid air cells and middle ear cavities are clear. Soft tissues: No significant soft tissue swelling is seen. Osseous: There is no acute osseous abnormality or suspicious osseous lesion. Limited intracranial: Assessed on the separately dictated CT head. IMPRESSION: 1. No acute orbital finding. 2. Apparent atrophy of the left optic nerve may reflect sequela of prior optic neuritis or other insult. Electronically Signed   By: Valetta Mole M.D.   On: 04/19/2022 13:23   CT Head Wo Contrast  Result Date: 04/19/2022 CLINICAL DATA:  Trauma EXAM: CT HEAD WITHOUT CONTRAST TECHNIQUE: Contiguous axial images were obtained from the base of the skull through the vertex without intravenous contrast. RADIATION DOSE REDUCTION: This exam was performed according to the departmental dose-optimization program which includes automated exposure control, adjustment of the mA and/or kV according to patient size and/or use of iterative reconstruction technique. COMPARISON:  CT Brain 11/16/15 FINDINGS: Brain: There is extensive periventricular hypodensity, which appears similar to 11/16/2015 and is favored to represent sequela of severe chronic microvascular ischemic change. There is disproportionate enlargement of the right lateral ventricular system, best seen at  the level of the right temporal horn. No hemorrhage. No CT evidence of the cortical infarct. Extra-axial fluid collection. Vascular: No hyperdense vessel or unexpected calcification. Small calcific density in the supraclinoid ICA on the left is new compared to 2017. Skull: Normal. Negative for  fracture or focal lesion. Sinuses/Orbits: No middle ear or mastoid effusion. Paranasal sinuses are clear. See separate CT orbits for orbital findings. Other: None. IMPRESSION: 1. No CT evidence of intracranial injury. See separate CT orbits for additional findings. 2. Interval increase in size of the ventricular system, likely secondary to interval volume loss. Persistent disproportionate enlargement of the right temporal horn, nonspecific. 3. Severe chronic microvascular ischemic change Electronically Signed   By: Marin Roberts M.D.   On: 04/19/2022 13:23    Procedures Procedures    Medications Ordered in ED Medications  fluorescein ophthalmic strip 1 strip (has no administration in time range)  tetracaine (PONTOCAINE) 0.5 % ophthalmic solution 2 drop (has no administration in time range)    ED Course/ Medical Decision Making/ A&P                             Medical Decision Making Amount and/or Complexity of Data Reviewed Independent Historian: parent and EMS Labs: ordered. Decision-making details documented in ED Course. Radiology: ordered and independent interpretation performed. Decision-making details documented in ED Course. ECG/medicine tests: ordered and independent interpretation performed. Decision-making details documented in ED Course.  Risk Prescription drug management.   Apparent subconjunctival hemorrhage noticed this morning.  Patient denies any pain.  Denies any trauma.  Denies any blurry vision or double vision.  Did have reassuring evaluation by optometrist on February 5.  No areas of fluorescein uptake.  Intraocular pressure is 25.  Acuity 20/100 bilaterally.  CT obtained  in triage shows no evidence of acute traumatic injury.  No fracture.  Does show chronic appearing atrophy of the left optic nerve.  D/w Dr. Katy Fitch of ophthalmology.  He reviewed patient's CT scan as well as clinical images.  He agrees likely subconjunctival hemorrhage.  He states optic nerve atrophy to be expected given her history of glaucoma.  Patient stable to follow-up with her ophthalmologist.  Suspect likely some subconjunctival hemorrhage which should improve on its own.  She was given her home drops for glaucoma given her pressure of 25.  Return to the ED with vision changes, headache, any other concerns. D/w daughter at bedside.       Final Clinical Impression(s) / ED Diagnoses Final diagnoses:  Subconjunctival hemorrhage of left eye    Rx / DC Orders ED Discharge Orders     None         Reilly Blades, Annie Main, MD 04/19/22 [phone]

## 2022-04-19 NOTE — ED Triage Notes (Addendum)
Pt to ED via EMS from nursing home - [place]. Today pt was receiving eye drops and staff noticed hematoma to left eye. Redness noted. Orientation at baseline,. Hx dementia. Speaks little at baseline.  Beb bound at baseline   Last VS 120/67, 99%RA , P 71.   No medications given by EMS.

## 2022-04-19 NOTE — ED Notes (Signed)
[facility] called. Pt to [facility]. NO ETA

## 2022-04-19 NOTE — ED Notes (Signed)
Patient 20/100 with both eyes, unable to complete exam with left eye.

## 2022-04-19 NOTE — ED Provider Triage Note (Signed)
Emergency Medicine Provider Triage Evaluation Note  Sarah Skinner , a 82 y.o. female  was evaluated in triage.  Pt complains of redness of the left eye.  Hx of dementia with behavior disturbance, history limited by such.  Reportedly eye redness was noted when caregivers attempted to place eyedrops today.  Unknown if recent injury/trauma.  Patient denies vision changes or significant eye pain though does appear to be only sensitive to light in affected eye.  Not on anticoagulation.  Review of Systems  Positive:  Negative: See above  Physical Exam  BP (!) 141/51   Pulse 68   Temp 97.9 F (36.6 C)   Resp 16   Ht '5\' 3"'$  (1.6 m)   Wt 68 kg   SpO2 97%   BMI 26.57 kg/m  Gen:   Awake, no distress   Resp:  Normal effort  MSK:   Moves extremities without difficulty  Other:  Erythema of the lower half of the conjunctiva, not quite flush, with purulent expiring.  No blood appreciated in the chamber/iris.  Does not appear to be retrobulbar hemorrhage.  Gaze aligned appropriately.  EOMI to providers face.  No foreign bodies appreciated.  Mild crusted yellow-green discharge in eyes bilaterally.  Medical Decision Making  Medically screening exam initiated at 12:08 PM.  Appropriate orders placed.  SKAI HARTLINE was informed that the remainder of the evaluation will be completed by another provider, this initial triage assessment does not replace that evaluation, and the importance of remaining in the ED until their evaluation is complete.  Discussed pt with attending Dr. Wyvonnia Dusky.  Plan to assess for possible trauma with CT head/orbits.   Prince Rome, PA-C [id number]

## 2022-04-19 NOTE — ED Notes (Signed)
Spoke with Genevie Ann LPN at [facility], informed him transport was on unit and pt on the way

## 2022-04-19 NOTE — ED Notes (Signed)
Dinner tray ordered.

## 2022-04-19 NOTE — Discharge Instructions (Signed)
Suspect small blood vessel in the eye ruptured.  This should heal slowly on its own.  No need for any additional medications.  Follow-up with your ophthalmologist or Dr. Carolynn Sayers for recheck this week.  Return to the ED with vision changes, headache, eye pain or any concerns.

## 2023-10-06 NOTE — Progress Notes (Signed)
 This encounter was created in error - please disregard.
